# Patient Record
Sex: Female | Born: 1935 | Race: White | Hispanic: No | Smoking: Never smoker
Health system: Southern US, Community
[De-identification: ages and names within clinical notes are randomized; demographics above are authoritative.]

## PROBLEM LIST (undated history)

## (undated) DIAGNOSIS — C569 Malignant neoplasm of unspecified ovary: Secondary | ICD-10-CM

## (undated) DIAGNOSIS — E785 Hyperlipidemia, unspecified: Secondary | ICD-10-CM

## (undated) DIAGNOSIS — C801 Malignant (primary) neoplasm, unspecified: Secondary | ICD-10-CM

## (undated) DIAGNOSIS — I10 Essential (primary) hypertension: Secondary | ICD-10-CM

## (undated) DIAGNOSIS — C50919 Malignant neoplasm of unspecified site of unspecified female breast: Secondary | ICD-10-CM

## (undated) HISTORY — DX: Malignant neoplasm of unspecified ovary: C56.9

---

## 1998-06-05 ENCOUNTER — Other Ambulatory Visit: Admission: RE | Admit: 1998-06-05 | Discharge: 1998-06-05 | Payer: Self-pay | Admitting: Obstetrics and Gynecology

## 1999-08-19 ENCOUNTER — Encounter: Admission: RE | Admit: 1999-08-19 | Discharge: 1999-08-19 | Payer: Self-pay | Admitting: Obstetrics and Gynecology

## 1999-08-19 ENCOUNTER — Encounter: Payer: Self-pay | Admitting: Obstetrics and Gynecology

## 2000-02-09 DIAGNOSIS — C50919 Malignant neoplasm of unspecified site of unspecified female breast: Secondary | ICD-10-CM

## 2000-02-09 HISTORY — DX: Malignant neoplasm of unspecified site of unspecified female breast: C50.919

## 2000-02-09 HISTORY — PX: BREAST LUMPECTOMY: SHX2

## 2001-01-26 ENCOUNTER — Encounter: Admission: RE | Admit: 2001-01-26 | Discharge: 2001-01-26 | Payer: Self-pay | Admitting: Obstetrics and Gynecology

## 2001-01-26 ENCOUNTER — Encounter: Payer: Self-pay | Admitting: Obstetrics and Gynecology

## 2001-02-06 ENCOUNTER — Encounter: Admission: RE | Admit: 2001-02-06 | Discharge: 2001-02-06 | Payer: Self-pay | Admitting: Obstetrics and Gynecology

## 2001-02-06 ENCOUNTER — Encounter: Payer: Self-pay | Admitting: Obstetrics and Gynecology

## 2001-02-15 ENCOUNTER — Other Ambulatory Visit: Admission: RE | Admit: 2001-02-15 | Discharge: 2001-02-15 | Payer: Self-pay | Admitting: General Surgery

## 2001-02-27 ENCOUNTER — Encounter: Payer: Self-pay | Admitting: General Surgery

## 2001-02-27 ENCOUNTER — Encounter: Admission: RE | Admit: 2001-02-27 | Discharge: 2001-02-27 | Payer: Self-pay | Admitting: General Surgery

## 2001-02-28 ENCOUNTER — Ambulatory Visit (HOSPITAL_BASED_OUTPATIENT_CLINIC_OR_DEPARTMENT_OTHER): Admission: RE | Admit: 2001-02-28 | Discharge: 2001-02-28 | Payer: Self-pay | Admitting: General Surgery

## 2001-03-13 ENCOUNTER — Ambulatory Visit (HOSPITAL_BASED_OUTPATIENT_CLINIC_OR_DEPARTMENT_OTHER): Admission: RE | Admit: 2001-03-13 | Discharge: 2001-03-13 | Payer: Self-pay | Admitting: General Surgery

## 2003-01-24 ENCOUNTER — Encounter: Admission: RE | Admit: 2003-01-24 | Discharge: 2003-01-24 | Payer: Self-pay | Admitting: General Surgery

## 2003-02-09 HISTORY — PX: BREAST LUMPECTOMY: SHX2

## 2003-06-12 ENCOUNTER — Encounter: Admission: RE | Admit: 2003-06-12 | Discharge: 2003-06-12 | Payer: Self-pay | Admitting: General Surgery

## 2004-01-27 ENCOUNTER — Encounter: Admission: RE | Admit: 2004-01-27 | Discharge: 2004-01-27 | Payer: Self-pay | Admitting: Internal Medicine

## 2004-02-25 ENCOUNTER — Encounter: Admission: RE | Admit: 2004-02-25 | Discharge: 2004-02-25 | Payer: Self-pay | Admitting: General Surgery

## 2004-02-28 ENCOUNTER — Encounter: Admission: RE | Admit: 2004-02-28 | Discharge: 2004-02-28 | Payer: Self-pay | Admitting: General Surgery

## 2004-03-09 ENCOUNTER — Encounter: Admission: RE | Admit: 2004-03-09 | Discharge: 2004-03-09 | Payer: Self-pay | Admitting: Obstetrics and Gynecology

## 2004-03-12 ENCOUNTER — Encounter: Admission: RE | Admit: 2004-03-12 | Discharge: 2004-03-12 | Payer: Self-pay | Admitting: General Surgery

## 2004-03-13 ENCOUNTER — Encounter: Admission: RE | Admit: 2004-03-13 | Discharge: 2004-03-13 | Payer: Self-pay | Admitting: General Surgery

## 2004-03-13 ENCOUNTER — Ambulatory Visit (HOSPITAL_BASED_OUTPATIENT_CLINIC_OR_DEPARTMENT_OTHER): Admission: RE | Admit: 2004-03-13 | Discharge: 2004-03-13 | Payer: Self-pay | Admitting: General Surgery

## 2004-03-13 ENCOUNTER — Ambulatory Visit (HOSPITAL_COMMUNITY): Admission: RE | Admit: 2004-03-13 | Discharge: 2004-03-13 | Payer: Self-pay | Admitting: General Surgery

## 2004-03-24 ENCOUNTER — Ambulatory Visit (HOSPITAL_COMMUNITY): Admission: RE | Admit: 2004-03-24 | Discharge: 2004-03-24 | Payer: Self-pay | Admitting: General Surgery

## 2004-04-02 ENCOUNTER — Ambulatory Visit: Payer: Self-pay | Admitting: Oncology

## 2004-04-09 ENCOUNTER — Ambulatory Visit: Payer: Self-pay | Admitting: Oncology

## 2004-05-09 ENCOUNTER — Ambulatory Visit: Payer: Self-pay | Admitting: Oncology

## 2004-06-08 ENCOUNTER — Ambulatory Visit: Payer: Self-pay | Admitting: Oncology

## 2004-10-01 ENCOUNTER — Ambulatory Visit: Payer: Self-pay | Admitting: Oncology

## 2004-11-05 ENCOUNTER — Ambulatory Visit: Payer: Self-pay | Admitting: Radiation Oncology

## 2005-01-27 ENCOUNTER — Encounter: Admission: RE | Admit: 2005-01-27 | Discharge: 2005-01-27 | Payer: Self-pay | Admitting: General Surgery

## 2005-03-30 ENCOUNTER — Ambulatory Visit: Payer: Self-pay | Admitting: Oncology

## 2005-04-08 ENCOUNTER — Ambulatory Visit: Payer: Self-pay | Admitting: Oncology

## 2005-09-28 ENCOUNTER — Ambulatory Visit: Payer: Self-pay | Admitting: Oncology

## 2005-10-09 ENCOUNTER — Ambulatory Visit: Payer: Self-pay | Admitting: Oncology

## 2006-01-28 ENCOUNTER — Encounter: Admission: RE | Admit: 2006-01-28 | Discharge: 2006-01-28 | Payer: Self-pay | Admitting: Obstetrics and Gynecology

## 2006-03-29 ENCOUNTER — Ambulatory Visit: Payer: Self-pay | Admitting: Oncology

## 2006-04-08 ENCOUNTER — Encounter: Admission: RE | Admit: 2006-04-08 | Discharge: 2006-04-08 | Payer: Self-pay | Admitting: General Surgery

## 2006-04-09 ENCOUNTER — Ambulatory Visit: Payer: Self-pay | Admitting: Oncology

## 2006-04-09 ENCOUNTER — Ambulatory Visit: Payer: Self-pay | Admitting: Radiation Oncology

## 2006-05-10 ENCOUNTER — Ambulatory Visit: Payer: Self-pay | Admitting: Radiation Oncology

## 2006-09-09 ENCOUNTER — Ambulatory Visit: Payer: Self-pay | Admitting: Oncology

## 2006-09-26 ENCOUNTER — Ambulatory Visit: Payer: Self-pay | Admitting: Oncology

## 2006-10-10 ENCOUNTER — Ambulatory Visit: Payer: Self-pay | Admitting: Oncology

## 2006-12-12 ENCOUNTER — Ambulatory Visit: Payer: Self-pay | Admitting: Internal Medicine

## 2006-12-13 ENCOUNTER — Other Ambulatory Visit: Payer: Self-pay

## 2006-12-13 ENCOUNTER — Inpatient Hospital Stay: Payer: Self-pay | Admitting: Internal Medicine

## 2007-02-20 ENCOUNTER — Inpatient Hospital Stay: Payer: Self-pay | Admitting: Internal Medicine

## 2007-03-12 ENCOUNTER — Ambulatory Visit: Payer: Self-pay | Admitting: Oncology

## 2007-03-24 ENCOUNTER — Ambulatory Visit: Payer: Self-pay | Admitting: Internal Medicine

## 2007-03-29 ENCOUNTER — Ambulatory Visit: Payer: Self-pay | Admitting: Oncology

## 2007-04-09 ENCOUNTER — Ambulatory Visit: Payer: Self-pay | Admitting: Oncology

## 2007-05-10 ENCOUNTER — Ambulatory Visit: Payer: Self-pay | Admitting: Oncology

## 2007-09-09 ENCOUNTER — Ambulatory Visit: Payer: Self-pay | Admitting: Oncology

## 2007-09-27 ENCOUNTER — Ambulatory Visit: Payer: Self-pay | Admitting: Oncology

## 2007-10-10 ENCOUNTER — Ambulatory Visit: Payer: Self-pay | Admitting: Oncology

## 2008-02-09 HISTORY — PX: BREAST BIOPSY: SHX20

## 2008-04-08 ENCOUNTER — Ambulatory Visit: Payer: Self-pay | Admitting: Oncology

## 2008-04-17 ENCOUNTER — Ambulatory Visit: Payer: Self-pay | Admitting: Oncology

## 2008-05-09 ENCOUNTER — Ambulatory Visit: Payer: Self-pay | Admitting: Oncology

## 2008-08-14 ENCOUNTER — Ambulatory Visit: Payer: Self-pay | Admitting: Internal Medicine

## 2008-09-06 ENCOUNTER — Ambulatory Visit: Payer: Self-pay | Admitting: Surgery

## 2008-09-12 ENCOUNTER — Ambulatory Visit: Payer: Self-pay | Admitting: Internal Medicine

## 2008-10-03 ENCOUNTER — Ambulatory Visit: Payer: Self-pay | Admitting: Surgery

## 2009-06-03 ENCOUNTER — Ambulatory Visit: Payer: Self-pay | Admitting: Internal Medicine

## 2010-02-28 ENCOUNTER — Encounter: Payer: Self-pay | Admitting: Obstetrics and Gynecology

## 2010-03-01 ENCOUNTER — Encounter: Payer: Self-pay | Admitting: General Surgery

## 2010-06-24 ENCOUNTER — Ambulatory Visit: Payer: Self-pay | Admitting: Internal Medicine

## 2011-07-28 ENCOUNTER — Ambulatory Visit: Payer: Self-pay | Admitting: Internal Medicine

## 2012-08-15 ENCOUNTER — Ambulatory Visit: Payer: Self-pay | Admitting: Internal Medicine

## 2012-08-27 ENCOUNTER — Inpatient Hospital Stay: Payer: Self-pay | Admitting: Internal Medicine

## 2012-08-27 LAB — URINALYSIS, COMPLETE
Ketone: NEGATIVE
Specific Gravity: 1.009 (ref 1.003–1.030)
Squamous Epithelial: 1
WBC UR: 276 /HPF (ref 0–5)

## 2012-08-27 LAB — COMPREHENSIVE METABOLIC PANEL
Alkaline Phosphatase: 86 U/L (ref 50–136)
Calcium, Total: 9 mg/dL (ref 8.5–10.1)
Creatinine: 2.12 mg/dL — ABNORMAL HIGH (ref 0.60–1.30)
EGFR (Non-African Amer.): 22 — ABNORMAL LOW
Potassium: 3.9 mmol/L (ref 3.5–5.1)
SGPT (ALT): 40 U/L (ref 12–78)
Sodium: 128 mmol/L — ABNORMAL LOW (ref 136–145)

## 2012-08-27 LAB — CBC WITH DIFFERENTIAL/PLATELET
Eosinophil #: 0 10*3/uL (ref 0.0–0.7)
Lymphocyte %: 5 %
Monocyte %: 6.3 %
RDW: 13.1 % (ref 11.5–14.5)

## 2012-08-28 LAB — BASIC METABOLIC PANEL
BUN: 37 mg/dL — ABNORMAL HIGH (ref 7–18)
Calcium, Total: 8.1 mg/dL — ABNORMAL LOW (ref 8.5–10.1)
Chloride: 104 mmol/L (ref 98–107)
Glucose: 112 mg/dL — ABNORMAL HIGH (ref 65–99)
Potassium: 3.5 mmol/L (ref 3.5–5.1)
Sodium: 136 mmol/L (ref 136–145)

## 2012-08-28 LAB — CBC WITH DIFFERENTIAL/PLATELET
Basophil #: 0 10*3/uL (ref 0.0–0.1)
HGB: 10.5 g/dL — ABNORMAL LOW (ref 12.0–16.0)
Lymphocyte #: 0.4 10*3/uL — ABNORMAL LOW (ref 1.0–3.6)
MCH: 30.4 pg (ref 26.0–34.0)
MCHC: 33.6 g/dL (ref 32.0–36.0)
Monocyte %: 7.7 %
Platelet: 115 10*3/uL — ABNORMAL LOW (ref 150–440)

## 2012-08-29 LAB — CBC WITH DIFFERENTIAL/PLATELET
Eosinophil %: 0.3 %
HCT: 30.9 % — ABNORMAL LOW (ref 35.0–47.0)
Lymphocyte %: 11.1 %
MCH: 30.3 pg (ref 26.0–34.0)
MCHC: 33.3 g/dL (ref 32.0–36.0)
Monocyte %: 10.3 %
Neutrophil #: 4.3 10*3/uL (ref 1.4–6.5)
Platelet: 132 10*3/uL — ABNORMAL LOW (ref 150–440)
RBC: 3.4 10*6/uL — ABNORMAL LOW (ref 3.80–5.20)
WBC: 5.5 10*3/uL (ref 3.6–11.0)

## 2012-08-29 LAB — BASIC METABOLIC PANEL
BUN: 22 mg/dL — ABNORMAL HIGH (ref 7–18)
Calcium, Total: 8.4 mg/dL — ABNORMAL LOW (ref 8.5–10.1)
Chloride: 109 mmol/L — ABNORMAL HIGH (ref 98–107)
EGFR (African American): 43 — ABNORMAL LOW

## 2012-08-29 LAB — IRON AND TIBC
Iron Bind.Cap.(Total): 142 ug/dL — ABNORMAL LOW (ref 250–450)
Iron: 15 ug/dL — ABNORMAL LOW (ref 50–170)
Unbound Iron-Bind.Cap.: 127 ug/dL

## 2012-08-29 LAB — URINE CULTURE

## 2012-08-30 LAB — CBC WITH DIFFERENTIAL/PLATELET
Eosinophil #: 0.1 10*3/uL (ref 0.0–0.7)
Eosinophil %: 1.9 %
HCT: 30.1 % — ABNORMAL LOW (ref 35.0–47.0)
MCHC: 34.2 g/dL (ref 32.0–36.0)
MCV: 91 fL (ref 80–100)
Monocyte #: 0.7 x10 3/mm (ref 0.2–0.9)
Monocyte %: 12.1 %
Neutrophil #: 4.3 10*3/uL (ref 1.4–6.5)
Platelet: 154 10*3/uL (ref 150–440)

## 2013-03-27 ENCOUNTER — Emergency Department: Payer: Self-pay | Admitting: Emergency Medicine

## 2013-03-27 LAB — URINALYSIS, COMPLETE
Bacteria: NONE SEEN
Bilirubin,UR: NEGATIVE
Glucose,UR: NEGATIVE mg/dL (ref 0–75)
Ketone: NEGATIVE
Leukocyte Esterase: NEGATIVE
Nitrite: NEGATIVE
Ph: 7 (ref 4.5–8.0)
Protein: NEGATIVE
RBC,UR: 1 /HPF (ref 0–5)
Specific Gravity: 1.008 (ref 1.003–1.030)
Squamous Epithelial: <1
WBC UR: 1 /HPF (ref 0–5)

## 2013-06-21 ENCOUNTER — Ambulatory Visit: Payer: Self-pay | Admitting: Urology

## 2013-08-23 ENCOUNTER — Ambulatory Visit: Payer: Self-pay | Admitting: Internal Medicine

## 2013-10-02 DIAGNOSIS — N183 Chronic kidney disease, stage 3 unspecified: Secondary | ICD-10-CM | POA: Insufficient documentation

## 2013-11-15 ENCOUNTER — Emergency Department: Payer: Self-pay | Admitting: Emergency Medicine

## 2013-11-15 LAB — CBC WITH DIFFERENTIAL/PLATELET
Basophil #: 0 10*3/uL (ref 0.0–0.1)
Basophil %: 0.7 %
EOS ABS: 0.1 10*3/uL (ref 0.0–0.7)
Eosinophil %: 1.8 %
HCT: 45 % (ref 35.0–47.0)
HGB: 14.2 g/dL (ref 12.0–16.0)
LYMPHS ABS: 2.1 10*3/uL (ref 1.0–3.6)
Lymphocyte %: 32.1 %
MCH: 30 pg (ref 26.0–34.0)
MCHC: 31.6 g/dL — ABNORMAL LOW (ref 32.0–36.0)
MCV: 95 fL (ref 80–100)
MONO ABS: 0.6 x10 3/mm (ref 0.2–0.9)
Monocyte %: 8.7 %
Neutrophil #: 3.8 10*3/uL (ref 1.4–6.5)
Neutrophil %: 56.7 %
Platelet: 207 10*3/uL (ref 150–440)
RBC: 4.74 10*6/uL (ref 3.80–5.20)
RDW: 13.1 % (ref 11.5–14.5)
WBC: 6.6 10*3/uL (ref 3.6–11.0)

## 2013-11-15 LAB — COMPREHENSIVE METABOLIC PANEL
ALBUMIN: 3.9 g/dL (ref 3.4–5.0)
ALK PHOS: 97 U/L
ANION GAP: 6 — AB (ref 7–16)
AST: 31 U/L (ref 15–37)
BUN: 17 mg/dL (ref 7–18)
Bilirubin,Total: 1.2 mg/dL — ABNORMAL HIGH (ref 0.2–1.0)
CALCIUM: 9.2 mg/dL (ref 8.5–10.1)
CO2: 27 mmol/L (ref 21–32)
Chloride: 105 mmol/L (ref 98–107)
Creatinine: 1.27 mg/dL (ref 0.60–1.30)
GFR CALC AF AMER: 52 — AB
GFR CALC NON AF AMER: 43 — AB
Glucose: 90 mg/dL (ref 65–99)
Osmolality: 277 (ref 275–301)
Potassium: 5.1 mmol/L (ref 3.5–5.1)
SGPT (ALT): 29 U/L
Sodium: 138 mmol/L (ref 136–145)
TOTAL PROTEIN: 8.2 g/dL (ref 6.4–8.2)

## 2013-11-15 LAB — URINALYSIS, COMPLETE
BILIRUBIN, UR: NEGATIVE
Glucose,UR: NEGATIVE mg/dL (ref 0–75)
Ketone: NEGATIVE
NITRITE: POSITIVE
Ph: 7 (ref 4.5–8.0)
Protein: NEGATIVE
Specific Gravity: 1.005 (ref 1.003–1.030)
WBC UR: 1428 /HPF (ref 0–5)

## 2013-11-15 LAB — TROPONIN I: Troponin-I: <0.02 ng/mL

## 2013-11-17 LAB — URINE CULTURE

## 2013-11-20 LAB — CULTURE, BLOOD (SINGLE)

## 2013-11-23 DIAGNOSIS — N390 Urinary tract infection, site not specified: Secondary | ICD-10-CM | POA: Insufficient documentation

## 2014-04-02 DIAGNOSIS — E034 Atrophy of thyroid (acquired): Secondary | ICD-10-CM | POA: Insufficient documentation

## 2014-05-31 NOTE — H&P (Signed)
PATIENT NAME:  Ebony Navarro, Ebony Navarro MR#:  [id number] DATE OF BIRTH:  [birth year]  DATE OF ADMISSION:  08/27/2012  REFERRING PHYSICIAN:  Dr.  Jacqualine Code     FAMILY PHYSICIAN:  Dr. Ramonita Lab  REASON FOR ADMISSION:  Acute renal failure.   HISTORY OF PRESENT ILLNESS:  The patient is a 81 year old female with a history of breast cancer, hypertension and hyperlipidemia, who presents with a 3 to 4 day history of nausea, vomiting, diarrhea and fever. Developed worsening weakness and fatigue. In the Emergency Room, the patient was noted to be in acute renal failure with dehydration. She was also found to have a UTI. She is now admitted for further evaluation.   PAST MEDICAL HISTORY: 1.  Breast cancer, status post lumpectomy.  2.  Benign hypertension.  3.  Hyperlipidemia. 4.  GE reflux disease.  5.  History of esophagitis.  6.  History of depression.   MEDICATIONS: 1.  Zocor 40 mg p.o. at bedtime.  2.  Prilosec 20 mg p.o. daily.  3.  Cardizem CD 120 mg p.o. daily.  4.  Xanax 0.25 mg p.o. every morning.   ALLERGIES: IODINE, CIPRO, MACROBID, SULFA, ACE INHIBITORS and BETA BLOCKERS.   SOCIAL HISTORY:  Negative for alcohol or tobacco abuse.   FAMILY HISTORY:  Negative for colon or breast cancer. Positive for coronary artery disease and hypertension.   REVIEW OF SYSTEMS:    CONSTITUTIONAL:  Positive fevers. No change in weight.  EYES:  No blurred or double vision. No glaucoma.  EARS, NOSE, THROAT: No tinnitus or hearing loss. No nasal discharge or bleeding. No difficulty swallowing.  RESPIRATORY:  Some cough, but no wheezing. Denies hemoptysis. No painful respiration.  CARDIOVASCULAR:  No chest pain or orthopnea. No palpitations or syncope.  GASTROINTESTINAL:  The patient has had nausea, vomiting, diarrhea and abdominal pain.  GENITOURINARY:  No dysuria or hematuria. No incontinence.  ENDOCRINE:  No polyuria or polydipsia. No heat or cold intolerance.  HEMATOLOGIC:  The patient denies anemia, easy  bruising or bleeding.  LYMPHATIC:   No swollen glands. MUSCULOSKELETAL:  The patient denies neck, back, shoulder, knee or hip pain. No gout.  NEUROLOGIC:  No numbness or migraines. Denies stroke or seizures. Does have generalized weakness.  PSYCHIATRIC:  The patient denies anxiety, insomnia or depression.   PHYSICAL EXAMINATION: GENERAL:  The patient is in no acute distress.  VITAL SIGNS:  Currently remarkable for blood pressure 127/71, with a heart rate of 105 and a respiratory rate of 20, temperature is 100.8.  HEENT: Normocephalic, atraumatic. Pupils equally round and reactive to light and accommodation. Extraocular movements are intact. Sclerae are nonicteric. Conjunctivae are clear. Oropharynx is dry, but clear.  NECK:  Supple, without JVD. No adenopathy or thyromegaly is noted.  LUNGS:  Clear to auscultation and percussion without wheezes, rales or rhonchi. No dullness.  CARDIAC EXAM:  A rapid rate with a regular rhythm. Normal S1 and S2. No significant rubs, murmurs or gallops. PMI is nondisplaced. Chest wall is nontender.  ABDOMEN:  Soft, nontender, with normoactive bowel sounds. No organomegaly or masses were appreciated. No hernias or bruits were noted. No CVA tenderness.  EXTREMITIES:  Without clubbing, cyanosis or edema. Pulses were 2+ bilaterally.  SKIN:  Warm and dry, without rash or lesions.  NEUROLOGIC EXAM: Revealed cranial nerves II through XII grossly intact. Deep tendon reflexes were symmetric. Motor and sensory exams nonfocal.  PSYCHIATRIC EXAM:  Revealed a patient who was alert and oriented to person, place and time. She  was cooperative and used good judgment.   LABORATORY DATA: Chest x-ray was unremarkable. White count was 12.5, with a hemoglobin of 13.3. Glucose 107, with a BUN of 38, creatinine of 2.12, with a GFR of 22. Sodium was 128, with a potassium of 3.9. Urinalysis revealed 2+ bacteria, with 3+ leukocyte esterase.   ASSESSMENT: 1.  Acute renal failure.  2.   Dehydration.  3.  Hyponatremia. 4.  Urinary tract infection.  5.  Tachycardia.  6.  Nausea with vomiting.   PLAN:  The patient will be admitted to the floor and started on IV antibiotics after the urine culture has been sent. Will begin IV fluids in the form of normal saline. Will use Zofran as needed for nausea and vomiting. Will continue her outpatient medications. Clear liquid diet for now. Follow up labs in the morning. Further treatment and evaluation will depend upon the patient's progress.   Total time spent on this patient was 50 minutes.    ____________________________ Leonie Douglas Doy Hutching, MD jds:mr D: 08/27/2012 17:41:00 ET T: 08/27/2012 19:41:15 ET JOB#: [id number]  cc: Leonie Douglas. Doy Hutching, MD, <Dictator> Adin Hector, MD  Persis Graffius Lennice Sites MD ELECTRONICALLY SIGNED 08/27/2012 20:11

## 2014-05-31 NOTE — Discharge Summary (Signed)
PATIENT NAME:  Ebony Navarro, Ebony Navarro MR#:  [id number] DATE OF BIRTH:  15-Aug-1933  DATE OF ADMISSION:  08/27/2012 DATE OF DISCHARGE:    FINAL DIAGNOSES:  1.  Urinary tract infection.  2.  Sepsis secondary to #1.  3.  Acute renal failure secondary to #1.  4.  History of breast cancer.  5.  Hypertension.  6.  Hyperlipidemia.  7.  Gastroesophageal reflux disease.   HISTORY AND PHYSICAL: Please see dictated admission history and physical.   [place]: The patient was admitted with nausea, vomiting, dehydration, evidence of acute renal failure, as well as evidence of sepsis. She was found to have evidence of urinary tract infection. Culture subsequently grew out E. coli, which was sensitive to all antibiotics. She was changed over to oral antibiotics and tolerated this well. Her diet was advanced and she was hydrated. Her renal function returned to normal. Her white count normalized as well. She was ambulating and felt back to in good health. She was therefore discharged to home in stable condition with physical activity to be up as tolerated. She will follow a 2 gram sodium diet and she will follow up in our office in the next 2 weeks, keeping the appointment that she already has arranged.   DISCHARGE MEDICATIONS:  1.  Simvastatin 40 mg p.o. at bedtime.  2.  Xanax 0.25 mg p.o. q.a.m.  3.  Omeprazole 20 mg p.o. daily.  4.  Cardizem CD 120 mg p.o. daily.  5.  Keflex 500 mg p.o. q.8 hours x 7 days to complete course of antibiotics.  ____________________________ Adin Hector, MD bjk:aw D: 08/30/2012 07:57:37 ET T: 08/30/2012 09:22:34 ET JOB#: [id number]  cc: Adin Hector, MD, <Dictator> Ramonita Lab MD ELECTRONICALLY SIGNED 08/31/2012 22:16

## 2014-07-29 ENCOUNTER — Other Ambulatory Visit: Payer: Self-pay | Admitting: Family Medicine

## 2014-07-29 DIAGNOSIS — R3129 Other microscopic hematuria: Secondary | ICD-10-CM

## 2014-09-02 ENCOUNTER — Other Ambulatory Visit: Payer: Self-pay | Admitting: Internal Medicine

## 2014-09-02 DIAGNOSIS — Z1231 Encounter for screening mammogram for malignant neoplasm of breast: Secondary | ICD-10-CM

## 2014-09-04 ENCOUNTER — Ambulatory Visit: Payer: Self-pay

## 2014-09-09 ENCOUNTER — Other Ambulatory Visit: Payer: Self-pay | Admitting: Internal Medicine

## 2014-09-09 ENCOUNTER — Ambulatory Visit
Admission: RE | Admit: 2014-09-09 | Discharge: 2014-09-09 | Disposition: A | Discharge status: Home / Self Care | Payer: Medicare Other | Source: Ambulatory Visit | Attending: Internal Medicine | Admitting: Internal Medicine

## 2014-09-09 DIAGNOSIS — Z1231 Encounter for screening mammogram for malignant neoplasm of breast: Secondary | ICD-10-CM

## 2014-09-09 HISTORY — DX: Malignant (primary) neoplasm, unspecified: C80.1

## 2014-09-09 HISTORY — DX: Malignant neoplasm of unspecified site of unspecified female breast: C50.919

## 2014-09-11 ENCOUNTER — Other Ambulatory Visit (INDEPENDENT_AMBULATORY_CARE_PROVIDER_SITE_OTHER): Payer: Medicare Other | Admitting: Obstetrics and Gynecology

## 2014-09-11 DIAGNOSIS — N390 Urinary tract infection, site not specified: Secondary | ICD-10-CM

## 2014-09-11 LAB — POCT URINALYSIS DIPSTICK
Bilirubin, UA: NEGATIVE
Glucose, UA: NEGATIVE
Ketones, UA: NEGATIVE
NITRITE UA: POSITIVE
PH UA: 5
Spec Grav, UA: 1.01
UROBILINOGEN UA: NEGATIVE

## 2014-09-11 MED ORDER — CEPHALEXIN 250 MG PO CAPS: 250.0000 mg | ORAL_CAPSULE | Freq: Every day | ORAL | Status: DC

## 2014-09-11 MED ORDER — AMOXICILLIN-POT CLAVULANATE 875-125 MG PO TABS: 1.0000 | ORAL_TABLET | Freq: Two times a day (BID) | ORAL | Status: DC

## 2014-09-11 NOTE — Addendum Note (Signed)
Addended by: Earl Lagos on: 09/11/2014 05:49 PM   Modules accepted: Orders

## 2014-09-11 NOTE — Progress Notes (Signed)
Pt walks in c/o UTI to receptionist and Dr. Lina Sayre for pt to drop off urine sample. Urinalysis positive. Augmentin 1 tab po bid x7d to be sent to pharmacy. Pt. Notified. Also filled Cephalexin 250mg  1 tab po daily #2 refills and pt instructed not to take until she finishes her Augmentin.

## 2014-09-14 LAB — URINE CULTURE

## 2014-12-27 ENCOUNTER — Other Ambulatory Visit: Payer: Self-pay

## 2014-12-27 DIAGNOSIS — N390 Urinary tract infection, site not specified: Secondary | ICD-10-CM

## 2014-12-27 MED ORDER — CEPHALEXIN 250 MG PO CAPS: 250.0000 mg | ORAL_CAPSULE | Freq: Every day | ORAL | Status: DC

## 2015-01-22 ENCOUNTER — Ambulatory Visit (INDEPENDENT_AMBULATORY_CARE_PROVIDER_SITE_OTHER): Payer: Medicare Other | Admitting: Obstetrics and Gynecology

## 2015-01-22 ENCOUNTER — Encounter: Payer: Self-pay | Admitting: Obstetrics and Gynecology

## 2015-01-22 VITALS — BP 143/89 | HR 103 | Ht 67.0 in | Wt 177.9 lb

## 2015-01-22 DIAGNOSIS — R739 Hyperglycemia, unspecified: Secondary | ICD-10-CM | POA: Insufficient documentation

## 2015-01-22 DIAGNOSIS — IMO0002 Reserved for concepts with insufficient information to code with codable children: Secondary | ICD-10-CM

## 2015-01-22 DIAGNOSIS — F329 Major depressive disorder, single episode, unspecified: Secondary | ICD-10-CM | POA: Insufficient documentation

## 2015-01-22 DIAGNOSIS — D649 Anemia, unspecified: Secondary | ICD-10-CM | POA: Insufficient documentation

## 2015-01-22 DIAGNOSIS — N390 Urinary tract infection, site not specified: Secondary | ICD-10-CM | POA: Diagnosis not present

## 2015-01-22 DIAGNOSIS — E785 Hyperlipidemia, unspecified: Secondary | ICD-10-CM | POA: Insufficient documentation

## 2015-01-22 DIAGNOSIS — N952 Postmenopausal atrophic vaginitis: Secondary | ICD-10-CM | POA: Diagnosis not present

## 2015-01-22 DIAGNOSIS — N8110 Cystocele, unspecified: Secondary | ICD-10-CM | POA: Diagnosis not present

## 2015-01-22 DIAGNOSIS — IMO0001 Reserved for inherently not codable concepts without codable children: Secondary | ICD-10-CM

## 2015-01-22 DIAGNOSIS — I10 Essential (primary) hypertension: Secondary | ICD-10-CM | POA: Insufficient documentation

## 2015-01-22 DIAGNOSIS — C50919 Malignant neoplasm of unspecified site of unspecified female breast: Secondary | ICD-10-CM | POA: Insufficient documentation

## 2015-01-22 DIAGNOSIS — F419 Anxiety disorder, unspecified: Secondary | ICD-10-CM | POA: Insufficient documentation

## 2015-01-22 DIAGNOSIS — K2970 Gastritis, unspecified, without bleeding: Secondary | ICD-10-CM | POA: Insufficient documentation

## 2015-01-22 DIAGNOSIS — F32A Depression, unspecified: Secondary | ICD-10-CM | POA: Insufficient documentation

## 2015-01-22 LAB — POCT URINALYSIS DIPSTICK
Bilirubin, UA: NEGATIVE
GLUCOSE UA: NEGATIVE
Ketones, UA: NEGATIVE
NITRITE UA: POSITIVE
PROTEIN UA: NEGATIVE
SPEC GRAV UA: 1.01
UROBILINOGEN UA: NEGATIVE
pH, UA: 6

## 2015-01-24 LAB — URINE CULTURE

## 2015-01-26 NOTE — Progress Notes (Signed)
GYNECOLOGY PROGRESS NOTE  Subjective:    Patient ID: Ebony Navarro, female    DOB: 1933-08-01, 81 y.o.   MRN: [id number]  HPI  Patient is a 81 y.o. P58 female who presents for follow up.  Patient with h/o cystocele, recurrent UTIs.  Has been on antibiotic suppression therapy for recurrent UTIs x 12 months (recently discontinued low dose Keflex ~ 2 weeks ago).  Desires urine testing today to ensure no new UTI. Denies symptoms currently.   The following portions of the patient's history were reviewed and updated as appropriate: allergies, current medications, past family history, past medical history, past social history, past surgical history and problem list.  Review of Systems A comprehensive review of systems was negative except for: Genitourinary: positive for slight bulge in vagina, not bothersome, occasionally noticed   Objective:   Blood pressure 143/89, pulse 103, height 5\' 7"  (1.702 m), weight 177 lb 14.4 oz (80.695 kg). General appearance: alert and no distress Abdomen: soft, non-tender; bowel sounds normal; no masses,  no organomegaly Pelvic: cervix normal in appearance, external genitalia normal, no adnexal masses or tenderness, no bladder tenderness, no cervical motion tenderness, positive findings: cystocele or vaginal mucosa atrophic, rectovaginal septum normal, uterus normal size, shape, and consistency and vagina normal without discharge Extremities: extremities normal, atraumatic, no cyanosis or edema Neurologic: Grossly normal    Labs:  Results for orders placed or performed in visit on 01/22/15  POCT urinalysis dipstick  Result Value Ref Range   Color, UA Pale Yellow    Clarity, UA coudy    Glucose, UA neg    Bilirubin, UA neg    Ketones, UA neg    Spec Grav, UA 1.010    Blood, UA Large    pH, UA 6.0    Protein, UA neg    Urobilinogen, UA negative    Nitrite, UA Positive    Leukocytes, UA large (3+) (A) Negative    Assessment:   Grade II  Cystocele H/o recurrent UTI's Vaginal atrophy  Plan:   1) Grade II Cystocele - Patient with h/o cystocele, has tried pessary in the past but did not take to it.  Notes that it is not currently symptomatic, just wanted reassurance that it had not worsened.  Continued to encourage Kegel exercises. 2) Recurrent UTI's - patient discontinued prophylactic long-term antibiotics after 1 year to see if still needed.  UTI noted on today's UA, will order and wait for culture due to patient's multiple allergies, and h/o drug resistance.  3) Vaginal atrophy - patient notes that she has not been using Premarin cream as much since she had been asymptomatic without a UTI for over 6-8 months.  Encouraged to begin using again just at urethral meatus 1-2 times weekly.   To follow up as needed, or if symptoms worsen.   Rubie Maid, MD [facility]

## 2015-01-27 ENCOUNTER — Telehealth: Payer: Self-pay

## 2015-01-27 MED ORDER — TRIMETHOPRIM 100 MG PO TABS: 100.0000 mg | ORAL_TABLET | Freq: Two times a day (BID) | ORAL | Status: DC

## 2015-01-27 NOTE — Telephone Encounter (Signed)
-----   Message from Rubie Maid, MD sent at 01/26/2015  5:07 PM EST ----- Please inform patient of UTI.  Has multi-drug resistance and due to patient's multiple allergies, there is very limited antibiotic coverage.  Will treat with Trimethoprim 100 mg BID x 10 days.  At some point in the near future patient is at risk for requiring IV antibiotics to treat UTIs.

## 2015-01-27 NOTE — Telephone Encounter (Signed)
Pt aware. Med rx.  

## 2015-02-12 ENCOUNTER — Other Ambulatory Visit: Payer: Medicare Other

## 2015-02-12 DIAGNOSIS — N390 Urinary tract infection, site not specified: Secondary | ICD-10-CM

## 2015-02-14 LAB — URINE CULTURE: ORGANISM ID, BACTERIA: NO GROWTH

## 2015-02-18 ENCOUNTER — Telehealth: Payer: Self-pay

## 2015-02-18 NOTE — Telephone Encounter (Signed)
-----   Message from Brayton Mars, MD sent at 02/17/2015  8:52 PM EST ----- Please Notify - Labs normal

## 2015-02-18 NOTE — Telephone Encounter (Signed)
Pt aware urine culture neg. Wants a atb to take daily. Will ask AC.

## 2015-02-20 NOTE — Telephone Encounter (Signed)
Please inform patient that we need to be cautious about continuous long use of antibiotics as she already has allergies to so many antibiotics, and may develop resistance over time to others if prescribed long term. If she has another UTI this month, will reconsider.

## 2015-02-21 NOTE — Telephone Encounter (Signed)
Pt aware,

## 2015-04-09 DIAGNOSIS — Z136 Encounter for screening for cardiovascular disorders: Secondary | ICD-10-CM

## 2015-04-09 DIAGNOSIS — I10 Essential (primary) hypertension: Secondary | ICD-10-CM

## 2015-04-09 DIAGNOSIS — Z139 Encounter for screening, unspecified: Secondary | ICD-10-CM

## 2015-04-09 NOTE — Congregational Nurse Program (Unsigned)
Congregational Nurse Program Note  Date of Encounter: 04/09/2015  Past Medical History: No past medical history on file.  Encounter Details:     CNP Questionnaire - 04/09/15 1214    Patient Demographics   Is this a new or existing patient? Existing   Patient is considered a/an Not Applicable   Race Caucasian/White   Patient Assistance   Location of Patient Assistance Not Applicable   Patient's financial/insurance status Medicare   Uninsured Patient No   Patient referred to apply for the following financial assistance Not Applicable   Food insecurities addressed Not Applicable   Transportation assistance No   Assistance securing medications No   Educational health offerings Cardiac disease   Encounter Details   Primary purpose of visit Chronic Illness/Condition Visit   Was an Emergency Department visit averted? Not Applicable   Does patient have a medical provider? Yes   Patient referred to Not Applicable   Was a mental health screening completed? (GAINS tool) No   Does patient have dental issues? No   Does patient have vision issues? No   Since previous encounter, have you referred patient for abnormal blood pressure that resulted in a new diagnosis or medication change? No   Since previous encounter, have you referred patient for abnormal blood glucose that resulted in a new diagnosis or medication change? No   For Abstraction Use Only   Does patient have insurance? Yes       Ebony Navarro, came to my office requesting her blood pressure to be check since it had not been for several weeks. BP 132/82 mmHg Ebony Navarro [place] RN, [place] [phone]  [name] [place], [place]

## 2015-08-25 ENCOUNTER — Other Ambulatory Visit: Payer: Self-pay | Admitting: Internal Medicine

## 2015-08-25 DIAGNOSIS — Z1231 Encounter for screening mammogram for malignant neoplasm of breast: Secondary | ICD-10-CM

## 2015-09-11 ENCOUNTER — Other Ambulatory Visit: Payer: Self-pay | Admitting: Internal Medicine

## 2015-09-11 ENCOUNTER — Ambulatory Visit
Admission: RE | Admit: 2015-09-11 | Discharge: 2015-09-11 | Disposition: A | Discharge status: Home / Self Care | Payer: Medicare Other | Source: Ambulatory Visit | Attending: Internal Medicine | Admitting: Internal Medicine

## 2015-09-11 DIAGNOSIS — Z1231 Encounter for screening mammogram for malignant neoplasm of breast: Secondary | ICD-10-CM | POA: Insufficient documentation

## 2016-02-29 ENCOUNTER — Emergency Department
Admission: EM | Admit: 2016-02-29 | Discharge: 2016-02-29 | Disposition: A | Discharge status: Home / Self Care | Payer: Medicare Other | Attending: Emergency Medicine | Admitting: Emergency Medicine

## 2016-02-29 DIAGNOSIS — N390 Urinary tract infection, site not specified: Secondary | ICD-10-CM | POA: Insufficient documentation

## 2016-02-29 DIAGNOSIS — Z853 Personal history of malignant neoplasm of breast: Secondary | ICD-10-CM | POA: Diagnosis not present

## 2016-02-29 DIAGNOSIS — Z79899 Other long term (current) drug therapy: Secondary | ICD-10-CM | POA: Insufficient documentation

## 2016-02-29 DIAGNOSIS — R319 Hematuria, unspecified: Secondary | ICD-10-CM | POA: Diagnosis present

## 2016-02-29 DIAGNOSIS — N183 Chronic kidney disease, stage 3 (moderate): Secondary | ICD-10-CM | POA: Insufficient documentation

## 2016-02-29 LAB — URINALYSIS, COMPLETE (UACMP) WITH MICROSCOPIC
Bilirubin Urine: NEGATIVE
Glucose, UA: NEGATIVE mg/dL
KETONES UR: NEGATIVE mg/dL
Nitrite: NEGATIVE
PH: 6 (ref 5.0–8.0)
Protein, ur: NEGATIVE mg/dL
SPECIFIC GRAVITY, URINE: 1.015 (ref 1.005–1.030)
SQUAMOUS EPITHELIAL / LPF: NONE SEEN

## 2016-02-29 MED ORDER — CEPHALEXIN 500 MG PO CAPS: 500.0000 mg | ORAL_CAPSULE | Freq: Two times a day (BID) | ORAL | 0 refills | Status: DC

## 2016-02-29 MED ORDER — CEFTRIAXONE SODIUM-DEXTROSE 1-3.74 GM-% IV SOLR: 1.0000 g | Freq: Once | INTRAVENOUS | Status: DC

## 2016-02-29 MED ORDER — DEXTROSE 5 % IV SOLN: 1.0000 g | Freq: Once | INTRAVENOUS | Status: DC

## 2016-02-29 MED ORDER — CEFTRIAXONE SODIUM 1 G IJ SOLR
1.0000 g | Freq: Once | INTRAMUSCULAR | Status: AC
  Administered 2016-02-29: 1 g via INTRAMUSCULAR
  Filled 2016-02-29: qty 10

## 2016-02-29 MED ORDER — LIDOCAINE HCL (PF) 1 % IJ SOLN
INTRAMUSCULAR | Status: AC
  Administered 2016-02-29: 2.1 mL
  Filled 2016-02-29: qty 5

## 2016-02-29 NOTE — ED Notes (Signed)
Family at bedside. 

## 2016-02-29 NOTE — Discharge Instructions (Signed)
You're being treated for urinary tract infection. You're given 24-hour dose of Rocephin shot today in the emergency room. Start Keflex antibiotic tomorrow evening. A urine culture was sent, and he will be called if it shows a resistant infection.  As we discussed, please increase your probiotics including yogurt, or probiotic capsules.  Return to the emergency department immediately for any new or worsening symptoms including pain, fever, worsening bleeding, dizziness, nausea, or any other symptoms concerning to you.

## 2016-02-29 NOTE — ED Provider Notes (Signed)
[facility] Emergency Department Provider Note ____________________________________________   I have reviewed the triage vital signs and the triage nursing note.  HISTORY  Chief Complaint Vaginal Bleeding   Historian Patient and daughter  HPI Ebony Navarro is a 83 y.o. female who lives alone, not on any blood thinners, presents to the emergency department after seeing blood on the toilet paper when she wiped today. Mild amount. No abdominal pain. No nausea. No vomiting. No diarrhea. She was seen a few days ago for abscess on the neck and is currently on clindamycin and mupirocin topical. That is healing up.  No fever. No respiratory symptoms.    Past Medical History:  Diagnosis Date  . Breast cancer ([place]) 2005   Right, radiation and lumpectomy  . Breast cancer ([place]) 2002   Left, Chemo, radiation and lumpectomy  . Cancer [facility])     Patient Active Problem List   Diagnosis Date Noted  . Absolute anemia 01/22/2015  . Anxiety 01/22/2015  . Malignant neoplasm of breast ([place]) 01/22/2015  . Clinical depression 01/22/2015  . Gastric catarrh 01/22/2015  . Blood glucose elevated 01/22/2015  . HLD (hyperlipidemia) 01/22/2015  . BP (high blood pressure) 01/22/2015  . Acquired atrophy of thyroid 04/02/2014  . Frequent UTI 11/23/2013  . Chronic kidney disease (CKD), stage III (moderate) 10/02/2013    Past Surgical History:  Procedure Laterality Date  . BREAST BIOPSY Left 2010   negative stereotactic biopsy  . BREAST LUMPECTOMY Right 2005   positive  . BREAST LUMPECTOMY Left 2002   positive    Prior to Admission medications   Medication Sig Start Date End Date Taking? Authorizing Provider  amoxicillin-clavulanate (AUGMENTIN) 875-125 MG per tablet Take 1 tablet by mouth 2 (two) times daily. 09/11/14   Rubie Maid, MD  atorvastatin (LIPITOR) 40 MG tablet Take by mouth.    Historical Provider, MD  cephALEXin (KEFLEX) 500 MG capsule Take 1 capsule  (500 mg total) by mouth 2 (two) times daily. 02/29/16   Lisa Roca, MD  diltiazem (CARDIZEM CD) 120 MG 24 hr capsule TAKE 1 CAPSULE BY MOUTH EVERY OTHER DAY 10/09/14   Historical Provider, MD  levothyroxine (SYNTHROID, LEVOTHROID) 50 MCG tablet TAKE 1 TABLET BY MOUTH DAILY ON AN EMPTY STOMACH WITH A GLASS OF WATER 30-60 MIN BEFORE BREAKFAST 11/11/14   Historical Provider, MD  PREVNAR 13 SUSP injection TO BE ADMINISTERED BY PHARMACIST FOR IMMUNIZATION 11/21/14   Historical Provider, MD  trimethoprim (TRIMPEX) 100 MG tablet Take 1 tablet (100 mg total) by mouth 2 (two) times daily. 01/27/15   Rubie Maid, MD    Allergies  Allergen Reactions  . Ace Inhibitors Other (See Comments)    hyperkalemia  . Atenolol Other (See Comments)    Worsening palpitations  . Iodine Other (See Comments)  . Metoprolol Tartrate Other (See Comments)    intolerant  . Povidone-Iodine Other (See Comments)  . Quinolones Other (See Comments)  . Rofecoxib Nausea Only  . Ciprofloxacin Rash  . Doxycycline Calcium Rash  . Nitrofurantoin Rash  . Sulfa Antibiotics Rash    Family History  Problem Relation Age of Onset  . Hypertension Mother   . Hypertension Father     Social History Social History  Substance Use Topics  . Smoking status: Never Smoker  . Smokeless tobacco: Not on file  . Alcohol use No    Review of Systems  Constitutional: Negative for fever. Eyes: Negative for visual changes. ENT: Negative for sore throat. Cardiovascular: Negative for  chest pain. Respiratory: Negative for shortness of breath. Gastrointestinal: Negative for abdominal pain, vomiting and diarrhea. Genitourinary: Negative for dysuria. Musculoskeletal: Negative for back pain. Skin: Abscess on neck is improving. Neurological: Negative for headache. 10 point Review of Systems otherwise negative ____________________________________________   PHYSICAL EXAM:  VITAL SIGNS: ED Triage Vitals  Enc Vitals Group     BP  02/29/16 1558 (!) 162/94     Pulse Rate 02/29/16 1558 (!) 102     Resp 02/29/16 1558 16     Temp 02/29/16 1558 98.3 F (36.8 C)     Temp Source 02/29/16 1558 Oral     SpO2 02/29/16 1558 97 %     Weight 02/29/16 1559 177 lb (80.3 kg)     Height --      Head Circumference --      Peak Flow --      Pain Score --      Pain Loc --      Pain Edu? --      Excl. in [place]? --      Constitutional: Alert and oriented. Well appearing and in no distress. HEENT   Head: Normocephalic and atraumatic.      Eyes: Conjunctivae are normal. PERRL. Normal extraocular movements.      Ears:         Nose: No congestion/rhinnorhea.   Mouth/Throat: Mucous membranes are moist.   Neck: No stridor.  Healing/scab abscess on the anterior neck. Cardiovascular/Chest: Normal rate, regular rhythm.  No murmurs, rubs, or gallops. Respiratory: Normal respiratory effort without tachypnea nor retractions. Breath sounds are clear and equal bilaterally. No wheezes/rales/rhonchi. Gastrointestinal: Soft. No distention, no guarding, no rebound. Nontender.    Genitourinary/rectal:  External visualization without any blood on the pad, no obvious vaginal bleeding. Musculoskeletal: Nontender with normal range of motion in all extremities. No joint effusions.  No lower extremity tenderness.  No edema. Neurologic:  Normal speech and language. No gross or focal neurologic deficits are appreciated. Skin:  Skin is warm, dry and intact. Healing scab on the anterior neck. Psychiatric: Mood and affect are normal. Speech and behavior are normal. Patient exhibits appropriate insight and judgment.   ____________________________________________  LABS (pertinent positives/negatives)  Labs Reviewed  URINALYSIS, COMPLETE (UACMP) WITH MICROSCOPIC - Abnormal; Notable for the following:       Result Value   Color, Urine YELLOW (*)    APPearance CLOUDY (*)    Hgb urine dipstick MODERATE (*)    Leukocytes, UA LARGE (*)    Bacteria,  UA MANY (*)    All other components within normal limits  URINE CULTURE    ____________________________________________    EKG I, Lisa Roca, MD, the attending physician have personally viewed and interpreted all ECGs.  None ____________________________________________  RADIOLOGY All Xrays were viewed by me. Imaging interpreted by Radiologist.  None __________________________________________  PROCEDURES  Procedure(s) performed: None  Critical Care performed: None  ____________________________________________   ED COURSE / ASSESSMENT AND PLAN  Pertinent labs & imaging results that were available during my care of the patient were reviewed by me and considered in my medical decision making (see chart for details).    Patient was not sure if she was having vaginal or urinary bleeding. She's had urinary tract infections before, but never with blood in it.  Urinalysis consistent with urinary tract infection with hematuria. I discussed this with the patient. External exam does not indicate vaginal/uterine bleeding.    In terms of the UTI, she does not appear  to be septic. No reported fevers. No systemic symptoms. No abdominal pain. I don't think that she needs additional laboratory studies at this point time. I think she is okay for treatment and outpatient management. I discussed with her one dose of Rocephin and she felt like this was a good plan.  Urine culture was sent. We discussed return precautions especially with regard to bleeding.    CONSULTATIONS:   None   Patient / Family / Caregiver informed of clinical course, medical decision-making process, and agree with plan.   I discussed return precautions, follow-up instructions, and discharge instructions with patient and/or family.   ___________________________________________   FINAL CLINICAL IMPRESSION(S) / ED DIAGNOSES   Final diagnoses:  Urinary tract infection with hematuria, site unspecified               Note: This dictation was prepared with Dragon dictation. Any transcriptional errors that result from this process are unintentional    Lisa Roca, MD 02/29/16 [phone]

## 2016-02-29 NOTE — ED Triage Notes (Addendum)
Pt started on antibiotics yesterday for abscess to chin. Pt c/o vaginal bleeding that started this AM. Described as light to moderate. Pt alert and oriented X4, active, cooperative, pt in NAD. RR even and unlabored, color WNL.  Pt reports frequency with urination. Denies pain.

## 2016-03-03 LAB — URINE CULTURE

## 2016-09-27 ENCOUNTER — Other Ambulatory Visit: Payer: Self-pay | Admitting: Internal Medicine

## 2016-09-27 DIAGNOSIS — Z1231 Encounter for screening mammogram for malignant neoplasm of breast: Secondary | ICD-10-CM

## 2016-10-14 ENCOUNTER — Ambulatory Visit
Admission: RE | Admit: 2016-10-14 | Discharge: 2016-10-14 | Disposition: A | Discharge status: Home / Self Care | Payer: Medicare Other | Source: Ambulatory Visit | Attending: Internal Medicine | Admitting: Internal Medicine

## 2016-10-14 DIAGNOSIS — Z1231 Encounter for screening mammogram for malignant neoplasm of breast: Secondary | ICD-10-CM | POA: Diagnosis present

## 2016-11-20 ENCOUNTER — Emergency Department: Payer: Medicare Other

## 2016-11-20 ENCOUNTER — Inpatient Hospital Stay
Admission: EM | Admit: 2016-11-20 | Discharge: 2016-11-22 | DRG: 760 | Disposition: A | Discharge status: Home / Self Care | Payer: Medicare Other | Attending: Internal Medicine | Admitting: Internal Medicine

## 2016-11-20 ENCOUNTER — Encounter: Payer: Self-pay | Admitting: Emergency Medicine

## 2016-11-20 DIAGNOSIS — N839 Noninflammatory disorder of ovary, fallopian tube and broad ligament, unspecified: Secondary | ICD-10-CM | POA: Diagnosis present

## 2016-11-20 DIAGNOSIS — E039 Hypothyroidism, unspecified: Secondary | ICD-10-CM | POA: Diagnosis present

## 2016-11-20 DIAGNOSIS — Z8249 Family history of ischemic heart disease and other diseases of the circulatory system: Secondary | ICD-10-CM | POA: Diagnosis not present

## 2016-11-20 DIAGNOSIS — Z923 Personal history of irradiation: Secondary | ICD-10-CM | POA: Diagnosis not present

## 2016-11-20 DIAGNOSIS — E785 Hyperlipidemia, unspecified: Secondary | ICD-10-CM | POA: Diagnosis present

## 2016-11-20 DIAGNOSIS — I350 Nonrheumatic aortic (valve) stenosis: Secondary | ICD-10-CM | POA: Diagnosis present

## 2016-11-20 DIAGNOSIS — N390 Urinary tract infection, site not specified: Secondary | ICD-10-CM | POA: Diagnosis present

## 2016-11-20 DIAGNOSIS — N838 Other noninflammatory disorders of ovary, fallopian tube and broad ligament: Secondary | ICD-10-CM

## 2016-11-20 DIAGNOSIS — Z853 Personal history of malignant neoplasm of breast: Secondary | ICD-10-CM | POA: Diagnosis not present

## 2016-11-20 DIAGNOSIS — I129 Hypertensive chronic kidney disease with stage 1 through stage 4 chronic kidney disease, or unspecified chronic kidney disease: Secondary | ICD-10-CM

## 2016-11-20 DIAGNOSIS — R971 Elevated cancer antigen 125 [CA 125]: Secondary | ICD-10-CM

## 2016-11-20 DIAGNOSIS — Z87440 Personal history of urinary (tract) infections: Secondary | ICD-10-CM

## 2016-11-20 DIAGNOSIS — Z9221 Personal history of antineoplastic chemotherapy: Secondary | ICD-10-CM

## 2016-11-20 DIAGNOSIS — K449 Diaphragmatic hernia without obstruction or gangrene: Secondary | ICD-10-CM | POA: Diagnosis present

## 2016-11-20 DIAGNOSIS — R180 Malignant ascites: Secondary | ICD-10-CM

## 2016-11-20 DIAGNOSIS — N183 Chronic kidney disease, stage 3 (moderate): Secondary | ICD-10-CM | POA: Diagnosis present

## 2016-11-20 DIAGNOSIS — R188 Other ascites: Secondary | ICD-10-CM

## 2016-11-20 DIAGNOSIS — R0602 Shortness of breath: Secondary | ICD-10-CM | POA: Diagnosis present

## 2016-11-20 DIAGNOSIS — R1013 Epigastric pain: Secondary | ICD-10-CM | POA: Diagnosis not present

## 2016-11-20 DIAGNOSIS — R109 Unspecified abdominal pain: Secondary | ICD-10-CM

## 2016-11-20 HISTORY — DX: Essential (primary) hypertension: I10

## 2016-11-20 LAB — COMPREHENSIVE METABOLIC PANEL
ALBUMIN: 3.4 g/dL — AB (ref 3.5–5.0)
ALK PHOS: 69 U/L (ref 38–126)
ALT: 18 U/L (ref 14–54)
AST: 36 U/L (ref 15–41)
Anion gap: 10 (ref 5–15)
BILIRUBIN TOTAL: 1.5 mg/dL — AB (ref 0.3–1.2)
BUN: 17 mg/dL (ref 6–20)
CALCIUM: 8.9 mg/dL (ref 8.9–10.3)
CO2: 25 mmol/L (ref 22–32)
Chloride: 102 mmol/L (ref 101–111)
Creatinine, Ser: 1.21 mg/dL — ABNORMAL HIGH (ref 0.44–1.00)
GFR calc Af Amer: 47 mL/min — ABNORMAL LOW (ref >60)
GFR calc non Af Amer: 41 mL/min — ABNORMAL LOW (ref >60)
GLUCOSE: 117 mg/dL — AB (ref 65–99)
POTASSIUM: 4.2 mmol/L (ref 3.5–5.1)
SODIUM: 137 mmol/L (ref 135–145)
Total Protein: 7.8 g/dL (ref 6.5–8.1)

## 2016-11-20 LAB — URINALYSIS, COMPLETE (UACMP) WITH MICROSCOPIC
BILIRUBIN URINE: NEGATIVE
Glucose, UA: NEGATIVE mg/dL
KETONES UR: NEGATIVE mg/dL
NITRITE: NEGATIVE
PROTEIN: NEGATIVE mg/dL
SPECIFIC GRAVITY, URINE: 1.023 (ref 1.005–1.030)
Squamous Epithelial / LPF: NONE SEEN
pH: 6 (ref 5.0–8.0)

## 2016-11-20 LAB — CBC
HEMATOCRIT: 41.3 % (ref 35.0–47.0)
Hemoglobin: 13.6 g/dL (ref 12.0–16.0)
MCH: 30.2 pg (ref 26.0–34.0)
MCHC: 32.9 g/dL (ref 32.0–36.0)
MCV: 91.8 fL (ref 80.0–100.0)
Platelets: 365 10*3/uL (ref 150–440)
RBC: 4.5 MIL/uL (ref 3.80–5.20)
RDW: 13.5 % (ref 11.5–14.5)
WBC: 7.6 10*3/uL (ref 3.6–11.0)

## 2016-11-20 LAB — TROPONIN I: Troponin I: 0.03 ng/mL (ref <0.03)

## 2016-11-20 LAB — LIPASE, BLOOD: Lipase: 24 U/L (ref 11–51)

## 2016-11-20 MED ORDER — SODIUM CHLORIDE 0.9 % IV SOLN
INTRAVENOUS | Status: DC
  Administered 2016-11-20 – 2016-11-21 (×2): via INTRAVENOUS

## 2016-11-20 MED ORDER — HEPARIN SODIUM (PORCINE) 5000 UNIT/ML IJ SOLN
5000.0000 [IU] | Freq: Three times a day (TID) | INTRAMUSCULAR | Status: DC
  Administered 2016-11-20 – 2016-11-21 (×5): 5000 [IU] via SUBCUTANEOUS
  Filled 2016-11-20 (×4): qty 1

## 2016-11-20 MED ORDER — BISACODYL 10 MG RE SUPP: 10.0000 mg | Freq: Every day | RECTAL | Status: DC | PRN

## 2016-11-20 MED ORDER — FAMOTIDINE IN NACL 20-0.9 MG/50ML-% IV SOLN
INTRAVENOUS | Status: AC
  Filled 2016-11-20: qty 50

## 2016-11-20 MED ORDER — CEFTRIAXONE SODIUM IN DEXTROSE 20 MG/ML IV SOLN
1.0000 g | INTRAVENOUS | Status: DC
  Administered 2016-11-20: 1 g via INTRAVENOUS
  Filled 2016-11-20 (×2): qty 50

## 2016-11-20 MED ORDER — LEVOTHYROXINE SODIUM 50 MCG PO TABS
50.0000 ug | ORAL_TABLET | Freq: Every day | ORAL | Status: DC
  Administered 2016-11-21: 50 ug via ORAL
  Filled 2016-11-20: qty 1

## 2016-11-20 MED ORDER — ONDANSETRON HCL 4 MG PO TABS: 4.0000 mg | ORAL_TABLET | Freq: Four times a day (QID) | ORAL | Status: DC | PRN

## 2016-11-20 MED ORDER — DILTIAZEM HCL ER COATED BEADS 180 MG PO CP24
180.0000 mg | ORAL_CAPSULE | Freq: Every day | ORAL | Status: DC
  Filled 2016-11-20: qty 1

## 2016-11-20 MED ORDER — HEPARIN SODIUM (PORCINE) 5000 UNIT/ML IJ SOLN
INTRAMUSCULAR | Status: AC
  Administered 2016-11-20: 5000 [IU] via SUBCUTANEOUS
  Filled 2016-11-20: qty 1

## 2016-11-20 MED ORDER — IOPAMIDOL (ISOVUE-300) INJECTION 61%
30.0000 mL | Freq: Once | INTRAVENOUS | Status: AC | PRN
  Administered 2016-11-20: 30 mL via ORAL

## 2016-11-20 MED ORDER — DOCUSATE SODIUM 100 MG PO CAPS
100.0000 mg | ORAL_CAPSULE | Freq: Two times a day (BID) | ORAL | Status: DC
  Administered 2016-11-21 – 2016-11-22 (×2): 100 mg via ORAL
  Filled 2016-11-20 (×3): qty 1

## 2016-11-20 MED ORDER — ATORVASTATIN CALCIUM 20 MG PO TABS
40.0000 mg | ORAL_TABLET | Freq: Every day | ORAL | Status: DC
  Administered 2016-11-21: 40 mg via ORAL
  Filled 2016-11-20: qty 2

## 2016-11-20 MED ORDER — ACETAMINOPHEN 325 MG PO TABS: 650.0000 mg | ORAL_TABLET | Freq: Four times a day (QID) | ORAL | Status: DC | PRN

## 2016-11-20 MED ORDER — SODIUM CHLORIDE 0.9 % IV BOLUS (SEPSIS)
500.0000 mL | Freq: Once | INTRAVENOUS | Status: AC
  Administered 2016-11-20: 500 mL via INTRAVENOUS

## 2016-11-20 MED ORDER — MORPHINE SULFATE (PF) 2 MG/ML IV SOLN: 2.0000 mg | INTRAVENOUS | Status: DC | PRN

## 2016-11-20 MED ORDER — ACETAMINOPHEN 650 MG RE SUPP: 650.0000 mg | Freq: Four times a day (QID) | RECTAL | Status: DC | PRN

## 2016-11-20 MED ORDER — DILTIAZEM HCL ER COATED BEADS 180 MG PO CP24
180.0000 mg | ORAL_CAPSULE | Freq: Every day | ORAL | Status: DC
  Administered 2016-11-21 – 2016-11-22 (×2): 180 mg via ORAL
  Filled 2016-11-20 (×2): qty 1

## 2016-11-20 MED ORDER — IPRATROPIUM-ALBUTEROL 0.5-2.5 (3) MG/3ML IN SOLN
3.0000 mL | Freq: Four times a day (QID) | RESPIRATORY_TRACT | Status: DC
  Administered 2016-11-20 – 2016-11-21 (×3): 3 mL via RESPIRATORY_TRACT
  Filled 2016-11-20 (×3): qty 3

## 2016-11-20 MED ORDER — SIMETHICONE 80 MG PO CHEW
160.0000 mg | CHEWABLE_TABLET | Freq: Four times a day (QID) | ORAL | Status: DC | PRN
  Filled 2016-11-20: qty 2

## 2016-11-20 MED ORDER — FAMOTIDINE IN NACL 20-0.9 MG/50ML-% IV SOLN
20.0000 mg | Freq: Two times a day (BID) | INTRAVENOUS | Status: DC
  Administered 2016-11-21: 20 mg via INTRAVENOUS
  Filled 2016-11-20 (×4): qty 50

## 2016-11-20 MED ORDER — IOPAMIDOL (ISOVUE-370) INJECTION 76%
75.0000 mL | Freq: Once | INTRAVENOUS | Status: AC | PRN
  Administered 2016-11-20: 75 mL via INTRAVENOUS

## 2016-11-20 MED ORDER — ONDANSETRON HCL 4 MG/2ML IJ SOLN
4.0000 mg | Freq: Four times a day (QID) | INTRAMUSCULAR | Status: DC | PRN
Start: 2016-11-20 — End: 2016-11-22
  Administered 2016-11-20 – 2016-11-21 (×2): 4 mg via INTRAVENOUS
  Filled 2016-11-20 (×2): qty 2

## 2016-11-20 NOTE — ED Notes (Signed)
Pt transferred to hospital bed for comfort while waiting for IP bed assignment.

## 2016-11-20 NOTE — ED Triage Notes (Signed)
States increasing SOB x 1 month. Arrives speaking rapidly in full sentences and states is anxious. SAT 97 room air. States has been having increasing abdominal girth for past month also.

## 2016-11-20 NOTE — ED Notes (Signed)
She[place] RN aware of bed assigned

## 2016-11-20 NOTE — ED Provider Notes (Signed)
d  [name] Emergency Department Provider Note  ____________________________________________   First MD Initiated Contact with Patient 11/20/16 [phone]     (approximate)  I have reviewed the triage vital signs and the nursing notes.   HISTORY  Chief Complaint Shortness of Breath   HPI Ebony Navarro is a 83 y.o. female with a history of breast cancer now on remission is presenting to the emergency department with 1 month of increasing abdominal distention and shortness of breath and the patient believes is from her increasing abdominal girth. She says that she has had recent diarrhea and now feels a lump on her rectum. Also with an episode of vomiting yesterday. Says that she is also belching forcefully and with frequency. Denies any pain. Denies any increased fluid intake. Says that she has had a cough without any fever. Does not report any sputum production.   Past Medical History:  Diagnosis Date  . Breast cancer ([place]) 2005   Right, radiation and lumpectomy  . Breast cancer ([place]) 2002   Left, Chemo, radiation and lumpectomy  . Cancer ([place])   . Hypertension     Patient Active Problem List   Diagnosis Date Noted  . Absolute anemia 01/22/2015  . Anxiety 01/22/2015  . Malignant neoplasm of breast ([place]) 01/22/2015  . Clinical depression 01/22/2015  . Gastric catarrh 01/22/2015  . Blood glucose elevated 01/22/2015  . HLD (hyperlipidemia) 01/22/2015  . BP (high blood pressure) 01/22/2015  . Acquired atrophy of thyroid 04/02/2014  . Frequent UTI 11/23/2013  . Chronic kidney disease (CKD), stage III (moderate) ([place]) 10/02/2013    Past Surgical History:  Procedure Laterality Date  . BREAST BIOPSY Left 2010   negative stereotactic biopsy  . BREAST LUMPECTOMY Right 2005   positive  . BREAST LUMPECTOMY Left 2002   positive    Prior to Admission medications   Medication Sig Start Date End Date Taking? Authorizing Provider  atorvastatin  (LIPITOR) 40 MG tablet Take 40 mg by mouth daily.    Yes [provider]  Cholecalciferol (VITAMIN D3) 1000 units CAPS Take 1,000 Units by mouth daily.   Yes [provider]  diltiazem (CARDIZEM CD) 120 MG 24 hr capsule TAKE 1 CAPSULE BY MOUTH EVERY OTHER DAY 10/09/14  Yes [provider]  levothyroxine (SYNTHROID, LEVOTHROID) 50 MCG tablet TAKE 1 TABLET BY MOUTH DAILY ON AN EMPTY STOMACH WITH A GLASS OF WATER 30-60 MIN BEFORE BREAKFAST 11/11/14  Yes [provider]  Multiple Vitamin (MULTI-VITAMINS) TABS Take 1 tablet by mouth daily.   Yes [provider]  omeprazole (PRILOSEC) 20 MG capsule Take 20 mg by mouth 2 (two) times daily. 11/02/16  Yes [provider]  trimethoprim (TRIMPEX) 100 MG tablet Take 1 tablet (100 mg total) by mouth 2 (two) times daily. Patient not taking: Reported on 11/20/2016 01/27/15   Rubie Maid, MD    Allergies Ace inhibitors; Atenolol; Iodine; Metoprolol tartrate; Omeprazole; Povidone-iodine; Quinolones; Rofecoxib; Ciprofloxacin; Doxycycline calcium; Nitrofurantoin; and Sulfa antibiotics  Family History  Problem Relation Age of Onset  . Hypertension Mother   . Hypertension Father     Social History Social History  Substance Use Topics  . Smoking status: Never Smoker  . Smokeless tobacco: Not on file  . Alcohol use No    Review of Systems  Constitutional: No fever/chills Eyes: No visual changes. ENT: No sore throat. Cardiovascular: Denies chest pain. Respiratory: as above. Gastrointestinal: No abdominal pain.  No constipation. Genitourinary: Negative for dysuria. Musculoskeletal: Negative  for back pain. Skin: Negative for rash. Neurological: Negative for headaches, focal weakness or numbness.   ____________________________________________   PHYSICAL EXAM:  VITAL SIGNS: ED Triage Vitals  Enc Vitals Group     BP 11/20/16 0827 (!) 161/80     Pulse Rate 11/20/16 0827 (!) 110     Resp  11/20/16 0827 (!) 22     Temp 11/20/16 0827 98.1 F (36.7 C)     Temp Source 11/20/16 0827 Oral     SpO2 11/20/16 0827 97 %     Weight 11/20/16 0828 165 lb (74.8 kg)     Height 11/20/16 0828 5\' 5"  (1.651 m)     Head Circumference --      Peak Flow --      Pain Score --      Pain Loc --      Pain Edu? --      Excl. in [place]? --    Constitutional: Alert and oriented. Well appearing and in no acute distress.belches loudly several times during the exam. Eyes: Conjunctivae are normal.  Head: Atraumatic. Nose: No congestion/rhinnorhea. Mouth/Throat: Mucous membranes are moist.  Neck: No stridor.   Cardiovascular: tachycardic, regular rhythm. Grossly normal heart sounds.   Respiratory: tachypnea. No retractions. minimal rales to the bilateral bases. Gastrointestinal: Soft and nontender. moderately distended but not tense. Musculoskeletal: No lower extremity tenderness nor edema.  No joint effusions. Neurologic:  Normal speech and language. No gross focal neurologic deficits are appreciated. Skin:  Skin is warm, dry and intact. No rash noted. Psychiatric: Mood and affect are normal. Speech and behavior are normal.  ____________________________________________   LABS (all labs ordered are listed, but only abnormal results are displayed)  Labs Reviewed  TROPONIN I - Abnormal; Notable for the following:       Result Value   Troponin I 0.03 (*)    All other components within normal limits  COMPREHENSIVE METABOLIC PANEL - Abnormal; Notable for the following:    Glucose, Bld 117 (*)    Creatinine, Ser 1.21 (*)    Albumin 3.4 (*)    Total Bilirubin 1.5 (*)    GFR calc non Af Amer 41 (*)    GFR calc Af Amer 47 (*)    All other components within normal limits  CBC  LIPASE, BLOOD  URINALYSIS, COMPLETE (UACMP) WITH MICROSCOPIC   ____________________________________________  EKG  ED ECG REPORT I, Doran Stabler, the attending physician, personally viewed and interpreted this  ECG.   Date: 11/20/2016  EKG Time: 829  Rate: 107  Rhythm: sinus tachycardia  Axis: left axis  Intervals:none  ST&T Change: no ST segment elevation or depression. No abnormal T-wave inversion.  ____________________________________________  RADIOLOGY  trace bilateral pleural effusions  No definitive pulmonary embolus on the CT of the chest. Abdominal CT with 7.1 x 4.4 cm lobulated right adnexal mass concerning for ovarian malignancy. Associated with moderate ascites. ____________________________________________   PROCEDURES  Procedure(s) performed:   Procedures  Critical Care performed:   ____________________________________________   INITIAL IMPRESSION / ASSESSMENT AND PLAN / ED COURSE  Pertinent labs & imaging results that were available during my care of the patient were reviewed by me and considered in my medical decision making (see chart for details).  Differential includes, but is not limited to, viral syndrome, bronchitis including COPD exacerbation, pneumonia, reactive airway disease including asthma, CHF including exacerbation with or without pulmonary/interstitial edema, pneumothorax, ACS, thoracic trauma, and pulmonary embolism.  abdominal distention differential diagnoses includes recurrent  cancer with metastatic disease causing bowel obstruction, ascites, mass effect  As part of my medical decision making, I reviewed the following data within the [place] reviewed including creatinine of 1.21. Previous charts also reviewed.     ----------------------------------------- 2:02 PM on 11/20/2016 -----------------------------------------  Patient continues without any respiratory distress. We discussed the exam findings with likely new ovarian mass causing the ascites. I also discussed the case with Dr. Tasia Catchings of oncology who says she will consult and see the patient this afternoon. Patient likely with her ascites causing her shortness of  breath and tachycardia. Patient is not hypoxic. She'll be admitted to the hospital. Signed out to Dr. Doy Hutching.  ____________________________________________   FINAL CLINICAL IMPRESSION(S) / ED DIAGNOSES  ovarian mass. Ascites. Shortness of breath.    NEW MEDICATIONS STARTED DURING THIS VISIT:  New Prescriptions   No medications on file     Note:  This document was prepared using Dragon voice recognition software and may include unintentional dictation errors.     Orbie Pyo, MD 11/20/16 [phone]

## 2016-11-20 NOTE — Consult Note (Signed)
Hematology/Oncology Consult note [facility] [phone] [phone]  Patient Care Team: Adin Hector, MD as PCP - General (Internal Medicine)   Name of the patient: Ebony Navarro  [id number]  1933/12/09   Date of visit: 11/20/2016 REASON FOR COSULTATION:  Ovarian mass History of presenting illness-  This is a 83 year old female who has a history of remote breast cancer, hypertension, thyroid disease presented with 2 weeks of increased abdominal distention/discomfort/dyspepsia and shortness of breath. Patient was found to have large amount of ascites. CT abdomen and pelvis with contrast showed 7.1 x 4.4 cm lobulated right ovarian mass concerning for ovarian malignancy. There is also moderate ascites. There is also moderate wall thickening of distal esophagus plus concerning for inflammation or malignancy. Patient reports that she has been her usual health until couple of weeks ago her abdomen started to be more distended with worsening of abdomen discomfort.she is also belching forcefully and with frequency.  Patient's left breast cancer was in 2002, treatment including lumpectomy, chemotherapy and radiation. She also developed right side breast cancer in 2005 status post lumpectomy and radiation.  Review of systems- ROS   Constitutional: Negative for fever, night sweats,unintentional weight loss, positive change in appetite. Positive fatigue HENT: Negative for ear pain, hearing loss, nasal bleeding Eyes: Negative for eye pain, double vision   Respiratory: Negative for wheezing, positive for shortness of breath, negative for cough Cardiovascular: Negative for chest pain, palpitation.   Gastrointestinal: Positive for abdominal distention, nauseated, blenching Endocrine: Negative  Genitourinary: Negative for dysuria, hematuria, frequency Skin: Negative for rash, iching, bruising Neurological: Negative for headache, dizziness,  seizure Hematological: Negative for easy bruising/bleeding, lymph node enlargement Psychiatric/Behavioral: Negative for depression, anxiety, suicidality Allergies  Allergen Reactions  . Ace Inhibitors Other (See Comments)    hyperkalemia  . Atenolol Other (See Comments)    Worsening palpitations  . Iodine Other (See Comments)    11/20/16: per conversation with pt, pt with allergy to topical iodine and betadine.  Pt states she has had IV contrast in the past with now issues.  . Metoprolol Tartrate Other (See Comments)    intolerant  . Omeprazole Diarrhea  . Povidone-Iodine Other (See Comments)  . Quinolones Other (See Comments)  . Rofecoxib Nausea Only  . Ciprofloxacin Rash  . Doxycycline Calcium Rash  . Nitrofurantoin Rash  . Sulfa Antibiotics Rash    Patient Active Problem List   Diagnosis Date Noted  . Abdominal pain 11/20/2016  . Ascites 11/20/2016  . Ovarian mass 11/20/2016  . Absolute anemia 01/22/2015  . Anxiety 01/22/2015  . Malignant neoplasm of breast ([place]) 01/22/2015  . Clinical depression 01/22/2015  . Gastric catarrh 01/22/2015  . Blood glucose elevated 01/22/2015  . HLD (hyperlipidemia) 01/22/2015  . BP (high blood pressure) 01/22/2015  . Acquired atrophy of thyroid 04/02/2014  . Frequent UTI 11/23/2013  . Chronic kidney disease (CKD), stage III (moderate) ([place]) 10/02/2013     Past Medical History:  Diagnosis Date  . Breast cancer ([place]) 2005   Right, radiation and lumpectomy  . Breast cancer ([place]) 2002   Left, Chemo, radiation and lumpectomy  . Cancer ([place])   . Hypertension      Past Surgical History:  Procedure Laterality Date  . BREAST BIOPSY Left 2010   negative stereotactic biopsy  . BREAST LUMPECTOMY Right 2005   positive  . BREAST LUMPECTOMY Left 2002   positive    Social History   Social History  . Marital status: Widowed  Spouse name: N/A  . Number of children: N/A  . Years of education: N/A   Occupational History  . Not  on file.   Social History Main Topics  . Smoking status: Never Smoker  . Smokeless tobacco: Never Used  . Alcohol use No  . Drug use: No  . Sexual activity: No   Other Topics Concern  . Not on file   Social History Narrative  . No narrative on file     Family History  Problem Relation Age of Onset  . Hypertension Mother   . Hypertension Father      Current Facility-Administered Medications:  .  0.9 %  sodium chloride infusion, , Intravenous, Continuous, Sparks, Leonie Douglas, MD, Last Rate: 75 mL/hr at 11/20/16 1633 .  acetaminophen (TYLENOL) tablet 650 mg, 650 mg, Oral, Q6H PRN **OR** acetaminophen (TYLENOL) suppository 650 mg, 650 mg, Rectal, Q6H PRN, Idelle Crouch, MD .  atorvastatin (LIPITOR) tablet 40 mg, 40 mg, Oral, Daily, Sparks, Leonie Douglas, MD .  bisacodyl (DULCOLAX) suppository 10 mg, 10 mg, Rectal, Daily PRN, Idelle Crouch, MD .  cefTRIAXone (ROCEPHIN) 1 g in dextrose 5 % 50 mL IVPB - Premix, 1 g, Intravenous, Q24H, Idelle Crouch, MD, Last Rate: 100 mL/hr at 11/20/16 1600, 1 g at 11/20/16 1600 .  diltiazem (CARDIZEM CD) 24 hr capsule 180 mg, 180 mg, Oral, Daily, Sparks, Leonie Douglas, MD .  docusate sodium (COLACE) capsule 100 mg, 100 mg, Oral, BID, Sparks, Leonie Douglas, MD .  famotidine (PEPCID) IVPB 20 mg premix, 20 mg, Intravenous, Q12H, Sparks, Jeffrey D, MD .  heparin injection 5,000 Units, 5,000 Units, Subcutaneous, Q8H, Sparks, Dellis Filbert D, MD .  ipratropium-albuterol (DUONEB) 0.5-2.5 (3) MG/3ML nebulizer solution 3 mL, 3 mL, Nebulization, QID, Idelle Crouch, MD, 3 mL at 11/20/16 1634 .  [START ON 11/21/2016] levothyroxine (SYNTHROID, LEVOTHROID) tablet 50 mcg, 50 mcg, Oral, QAC breakfast, Sparks, Dellis Filbert D, MD .  morphine 2 MG/ML injection 2 mg, 2 mg, Intravenous, Q2H PRN, Idelle Crouch, MD .  ondansetron (ZOFRAN) tablet 4 mg, 4 mg, Oral, Q6H PRN **OR** ondansetron (ZOFRAN) injection 4 mg, 4 mg, Intravenous, Q6H PRN, Idelle Crouch, MD, 4 mg at 11/20/16  1651 .  simethicone (MYLICON) chewable tablet 160 mg, 160 mg, Oral, QID PRN, Idelle Crouch, MD  Current Outpatient Prescriptions:  .  atorvastatin (LIPITOR) 40 MG tablet, Take 40 mg by mouth daily. , Disp: , Rfl:  .  Cholecalciferol (VITAMIN D3) 1000 units CAPS, Take 1,000 Units by mouth daily., Disp: , Rfl:  .  diltiazem (CARDIZEM CD) 120 MG 24 hr capsule, TAKE 1 CAPSULE BY MOUTH EVERY OTHER DAY, Disp: , Rfl:  .  levothyroxine (SYNTHROID, LEVOTHROID) 50 MCG tablet, TAKE 1 TABLET BY MOUTH DAILY ON AN EMPTY STOMACH WITH A GLASS OF WATER 30-60 MIN BEFORE BREAKFAST, Disp: , Rfl:  .  Multiple Vitamin (MULTI-VITAMINS) TABS, Take 1 tablet by mouth daily., Disp: , Rfl:  .  omeprazole (PRILOSEC) 20 MG capsule, Take 20 mg by mouth 2 (two) times daily., Disp: , Rfl: 1 .  trimethoprim (TRIMPEX) 100 MG tablet, Take 1 tablet (100 mg total) by mouth 2 (two) times daily. (Patient not taking: Reported on 11/20/2016), Disp: 20 tablet, Rfl: 0   Physical exam:  Vitals:   11/20/16 1300 11/20/16 1330 11/20/16 1430 11/20/16 1500  BP: (!) 142/73 140/75 134/82 (!) 151/87  Pulse: 91 95 100 (!) 103  Resp: 15 18 17 15   Temp:  TempSrc:      SpO2: 96% 94% 94% 94%  Weight:      Height:       GENERAL:Alert, no distress and comfortable.  EYES: no pallor or icterus OROPHARYNX: no thrush or ulceration; good dentition  NECK: supple, no masses felt UNGS: clear to auscultation and  No wheeze or crackles HEART/CVS: regular rate & rhythm and no murmurs; No lower extremity edema ABDOMEN: abdomen Distended. Musculoskeletal:no cyanosis of digits and no clubbing  PSYCH: alert & oriented x 3  NEURO: no focal motor/sensory deficits SKIN:  no rashes or significant lesions     CMP Latest Ref Rng & Units 11/20/2016  Glucose 65 - 99 mg/dL 117(H)  BUN 6 - 20 mg/dL 17  Creatinine 0.44 - 1.00 mg/dL 1.21(H)  Sodium 135 - 145 mmol/L 137  Potassium 3.5 - 5.1 mmol/L 4.2  Chloride 101 - 111 mmol/L 102  CO2 22 - 32  mmol/L 25  Calcium 8.9 - 10.3 mg/dL 8.9  Total Protein 6.5 - 8.1 g/dL 7.8  Total Bilirubin 0.3 - 1.2 mg/dL 1.5(H)  Alkaline Phos 38 - 126 U/L 69  AST 15 - 41 U/L 36  ALT 14 - 54 U/L 18   CBC Latest Ref Rng & Units 11/20/2016  WBC 3.6 - 11.0 K/uL 7.6  Hemoglobin 12.0 - 16.0 g/dL 13.6  Hematocrit 35.0 - 47.0 % 41.3  Platelets 150 - 440 K/uL 365    @IMAGES @  Dg Chest 2 View  Result Date: 11/20/2016 CLINICAL DATA:  Increasing shortness of breath over the past month. EXAM: CHEST  2 VIEW COMPARISON:  Chest x-ray dated August 27, 2012. FINDINGS: The cardiomediastinal silhouette is normal in size. Normal pulmonary vascularity. Trace bilateral pleural effusions. No focal consolidation or pneumothorax. No acute osseous abnormality. IMPRESSION: Trace bilateral pleural effusions. Electronically Signed   By: Titus Dubin M.D.   On: 11/20/2016 09:45   Ct Angio Chest Pe W And/or Wo Contrast  Result Date: 11/20/2016 CLINICAL DATA:  Shortness of breath.  Abdominal distension. EXAM: CT ANGIOGRAPHY CHEST CT ABDOMEN AND PELVIS WITH CONTRAST TECHNIQUE: Multidetector CT imaging of the chest was performed using the standard protocol during bolus administration of intravenous contrast. Multiplanar CT image reconstructions and MIPs were obtained to evaluate the vascular anatomy. Multidetector CT imaging of the abdomen and pelvis was performed using the standard protocol during bolus administration of intravenous contrast. CONTRAST:  75 mL of Isovue 370 intravenously. COMPARISON:  Radiographs of same day.  CT scan of Jun 21, 2013. FINDINGS: CTA CHEST FINDINGS Cardiovascular: Satisfactory opacification of the pulmonary arteries to the segmental level. No evidence of pulmonary embolism. Normal heart size. No pericardial effusion. Atherosclerosis of thoracic aorta is noted without aneurysm formation. Coronary artery calcifications are noted. Mediastinum/Nodes: Moderate size sliding-type hiatal hernia is noted. Wall  thickening of distal esophagus is noted concerning for inflammation. Thyroid gland is unremarkable. No mediastinal adenopathy is noted. Lungs/Pleura: No pneumothorax is noted. Mild bilateral pleural effusions are noted with adjacent subsegmental atelectasis. Musculoskeletal: No chest wall abnormality. No acute or significant osseous findings. Postsurgical changes are seen involving both breasts. Review of the MIP images confirms the above findings. CT ABDOMEN and PELVIS FINDINGS Hepatobiliary: Minimal cholelithiasis is noted. No abnormality is seen in the liver. Pancreas: Stable 3.3 cm cystic lesion seen arising from pancreatic tail. No ductal dilatation or inflammation is noted. Spleen: Normal in size without focal abnormality. Adrenals/Urinary Tract: Adrenal glands appear normal. Stable bilateral renal cysts are noted. No hydronephrosis or renal obstruction is  noted. No renal or ureteral calculi are noted. Urinary bladder is mildly distended, with air-fluid level present superiorly, suggesting recent instrumentation. Stomach/Bowel: Stomach is within normal limits. Appendix appears normal. No evidence of bowel wall thickening, distention, or inflammatory changes. Vascular/Lymphatic: Aortic atherosclerosis. No enlarged abdominal or pelvic lymph nodes. Reproductive: 7.1 x 4.7 cm lobulated and heterogeneously enhancing right adnexal mass is noted concerning for ovarian malignancy. Uterus and left ovary are unremarkable. Other: Moderate ascites is noted.  No definite hernia is noted. Musculoskeletal: No acute or significant osseous findings. Review of the MIP images confirms the above findings. IMPRESSION: No definite evidence of pulmonary embolus. 7.1 x 4.4 cm lobulated right adnexal mass is noted concerning for ovarian malignancy. Moderate ascites is noted. Aortic atherosclerosis. Minimal cholelithiasis. Moderate size sliding-type hiatal hernia is noted. Moderate wall thickening of distal esophagus is noted  concerning for inflammation or neoplasm. Endoscopy is recommended for further evaluation. Stable 3.3 cm cystic lesion arising from pancreatic tail. Coronary artery calcifications are noted suggesting coronary artery disease. Electronically Signed   By: Marijo Conception, M.D.   On: 11/20/2016 13:31   Ct Abdomen Pelvis W Contrast  Result Date: 11/20/2016 CLINICAL DATA:  Shortness of breath.  Abdominal distension. EXAM: CT ANGIOGRAPHY CHEST CT ABDOMEN AND PELVIS WITH CONTRAST TECHNIQUE: Multidetector CT imaging of the chest was performed using the standard protocol during bolus administration of intravenous contrast. Multiplanar CT image reconstructions and MIPs were obtained to evaluate the vascular anatomy. Multidetector CT imaging of the abdomen and pelvis was performed using the standard protocol during bolus administration of intravenous contrast. CONTRAST:  75 mL of Isovue 370 intravenously. COMPARISON:  Radiographs of same day.  CT scan of Jun 21, 2013. FINDINGS: CTA CHEST FINDINGS Cardiovascular: Satisfactory opacification of the pulmonary arteries to the segmental level. No evidence of pulmonary embolism. Normal heart size. No pericardial effusion. Atherosclerosis of thoracic aorta is noted without aneurysm formation. Coronary artery calcifications are noted. Mediastinum/Nodes: Moderate size sliding-type hiatal hernia is noted. Wall thickening of distal esophagus is noted concerning for inflammation. Thyroid gland is unremarkable. No mediastinal adenopathy is noted. Lungs/Pleura: No pneumothorax is noted. Mild bilateral pleural effusions are noted with adjacent subsegmental atelectasis. Musculoskeletal: No chest wall abnormality. No acute or significant osseous findings. Postsurgical changes are seen involving both breasts. Review of the MIP images confirms the above findings. CT ABDOMEN and PELVIS FINDINGS Hepatobiliary: Minimal cholelithiasis is noted. No abnormality is seen in the liver. Pancreas:  Stable 3.3 cm cystic lesion seen arising from pancreatic tail. No ductal dilatation or inflammation is noted. Spleen: Normal in size without focal abnormality. Adrenals/Urinary Tract: Adrenal glands appear normal. Stable bilateral renal cysts are noted. No hydronephrosis or renal obstruction is noted. No renal or ureteral calculi are noted. Urinary bladder is mildly distended, with air-fluid level present superiorly, suggesting recent instrumentation. Stomach/Bowel: Stomach is within normal limits. Appendix appears normal. No evidence of bowel wall thickening, distention, or inflammatory changes. Vascular/Lymphatic: Aortic atherosclerosis. No enlarged abdominal or pelvic lymph nodes. Reproductive: 7.1 x 4.7 cm lobulated and heterogeneously enhancing right adnexal mass is noted concerning for ovarian malignancy. Uterus and left ovary are unremarkable. Other: Moderate ascites is noted.  No definite hernia is noted. Musculoskeletal: No acute or significant osseous findings. Review of the MIP images confirms the above findings. IMPRESSION: No definite evidence of pulmonary embolus. 7.1 x 4.4 cm lobulated right adnexal mass is noted concerning for ovarian malignancy. Moderate ascites is noted. Aortic atherosclerosis. Minimal cholelithiasis. Moderate size sliding-type hiatal hernia is noted.  Moderate wall thickening of distal esophagus is noted concerning for inflammation or neoplasm. Endoscopy is recommended for further evaluation. Stable 3.3 cm cystic lesion arising from pancreatic tail. Coronary artery calcifications are noted suggesting coronary artery disease. Electronically Signed   By: Marijo Conception, M.D.   On: 11/20/2016 13:31    Assessment and plan- Patient is a 83 y.o. female or has a remote history of breast cancer now presenting with 2-3 weeks of abdominal distention, found to have ascites and adnexal mass on CT.  1 Ascites and adnexal mass, likely ovarian cancer: Agree with ultrasound-guided diagnostic  and therapeutic paracentesis, send fluid for cytology. Check CEA 125. 2. History of breast cancer of both breasts: Does not have patient's previous history/treatment note and pathology. I will obtain the detailed history of her treatment on Monday. 3 thickening of distal esophagus, dyspepsia: On Pepcid. Consider GI workup..  Thank you for this kind referral and the opportunity to participate in the care of this patient. I will follow along her inpatient course.  Dr. Earlie Server, MD, PhD [facility] at [facility] Pager- [phone] 11/20/2016

## 2016-11-20 NOTE — ED Notes (Signed)
Pt

## 2016-11-20 NOTE — H&P (Signed)
History and Physical    Ebony Navarro HYW:[id number] DOB: 1933/03/31 DOA: 11/20/2016  Referring physician: Dr. Clearnce Hasten PCP: Adin Hector, MD  Specialists: none  Chief Complaint: abdominal pain and distention  HPI: Ebony Navarro is a 83 y.o. female has a past medical history significant for HTN, breast cancer, and thyroid disease now with 2 week hx of increased abdominal distention with pain and dyspepsia and worsening SOB. In ER, pt found to have a right ovarian mass with a large amount of ascites. She is now admitted. No fever. Some nausea but no vomiting or diarrhea. Denies CP.  Review of Systems: The patient denies anorexia, fever, weight loss,, vision loss, decreased hearing, hoarseness, chest pain, syncope,  peripheral edema, balance deficits, hemoptysis,  melena, hematochezia, severe indigestion/heartburn, hematuria, incontinence, genital sores, muscle weakness, suspicious skin lesions, transient blindness, difficulty walking, depression, unusual weight change, abnormal bleeding, enlarged lymph nodes, angioedema, and breast masses.   Past Medical History:  Diagnosis Date  . Breast cancer ([place]) 2005   Right, radiation and lumpectomy  . Breast cancer ([place]) 2002   Left, Chemo, radiation and lumpectomy  . Cancer ([place])   . Hypertension    Past Surgical History:  Procedure Laterality Date  . BREAST BIOPSY Left 2010   negative stereotactic biopsy  . BREAST LUMPECTOMY Right 2005   positive  . BREAST LUMPECTOMY Left 2002   positive   Social History:  reports that she has never smoked. She has never used smokeless tobacco. She reports that she does not drink alcohol or use drugs.  Allergies  Allergen Reactions  . Ace Inhibitors Other (See Comments)    hyperkalemia  . Atenolol Other (See Comments)    Worsening palpitations  . Iodine Other (See Comments)    11/20/16: per conversation with pt, pt with allergy to topical iodine and betadine.  Pt states she has  had IV contrast in the past with now issues.  . Metoprolol Tartrate Other (See Comments)    intolerant  . Omeprazole Diarrhea  . Povidone-Iodine Other (See Comments)  . Quinolones Other (See Comments)  . Rofecoxib Nausea Only  . Ciprofloxacin Rash  . Doxycycline Calcium Rash  . Nitrofurantoin Rash  . Sulfa Antibiotics Rash    Family History  Problem Relation Age of Onset  . Hypertension Mother   . Hypertension Father     Prior to Admission medications   Medication Sig Start Date End Date Taking? Authorizing Provider  atorvastatin (LIPITOR) 40 MG tablet Take 40 mg by mouth daily.    Yes [provider]  Cholecalciferol (VITAMIN D3) 1000 units CAPS Take 1,000 Units by mouth daily.   Yes [provider]  diltiazem (CARDIZEM CD) 120 MG 24 hr capsule TAKE 1 CAPSULE BY MOUTH EVERY OTHER DAY 10/09/14  Yes [provider]  levothyroxine (SYNTHROID, LEVOTHROID) 50 MCG tablet TAKE 1 TABLET BY MOUTH DAILY ON AN EMPTY STOMACH WITH A GLASS OF WATER 30-60 MIN BEFORE BREAKFAST 11/11/14  Yes [provider]  Multiple Vitamin (MULTI-VITAMINS) TABS Take 1 tablet by mouth daily.   Yes [provider]  omeprazole (PRILOSEC) 20 MG capsule Take 20 mg by mouth 2 (two) times daily. 11/02/16  Yes [provider]  trimethoprim (TRIMPEX) 100 MG tablet Take 1 tablet (100 mg total) by mouth 2 (two) times daily. Patient not taking: Reported on 11/20/2016 01/27/15   Rubie Maid, MD   Physical Exam: Vitals:   11/20/16 1200 11/20/16 1230 11/20/16 1300 11/20/16 1330  BP: (!) 141/82 (!) 141/77 (!) 142/73 140/75  Pulse: 94 99 91 95  Resp: 17 16 15 18   Temp:      TempSrc:      SpO2: 95% 95% 96% 94%  Weight:      Height:         General:  No apparent distress, WDWN, [place]/AT  Eyes: PERRL, EOMI, no scleral icterus , conjunctiva clear  ENT: moist oropharynx without exudate, TM's benign, dentition good  Neck: supple, no lymphadenopathy. No bruits or  thyromegaly  Cardiovascular: regular rate with 2/6 systolic murmur noted. No rubs or gallops; 2+ peripheral pulses, no JVD, no peripheral edema  Respiratory: basilar rhonchi without wheezes or rales. No dullness. Respiratory effort normal  Abdomen: soft, non tender to palpation, positive bowel sounds, no guarding, no rebound. Mild distention with positive fluid wave  Skin: no rashes or lesion  Musculoskeletal: normal bulk and tone, no joint swelling  Psychiatric: normal mood and affect, A&OX3  Neurologic: CN 2-12 grossly intact, Motor strength 5/5 in all 4 groups with symmetric DTR's and non-focal sensory exam  Labs on Admission:  Basic Metabolic Panel:  Recent Labs Lab 11/20/16 0834  NA 137  K 4.2  CL 102  CO2 25  GLUCOSE 117*  BUN 17  CREATININE 1.21*  CALCIUM 8.9   Liver Function Tests:  Recent Labs Lab 11/20/16 0834  AST 36  ALT 18  ALKPHOS 69  BILITOT 1.5*  PROT 7.8  ALBUMIN 3.4*    Recent Labs Lab 11/20/16 0834  LIPASE 24   No results for input(s): AMMONIA in the last 168 hours. CBC:  Recent Labs Lab 11/20/16 0834  WBC 7.6  HGB 13.6  HCT 41.3  MCV 91.8  PLT 365   Cardiac Enzymes:  Recent Labs Lab 11/20/16 0834  TROPONINI 0.03*    BNP (last 3 results) No results for input(s): BNP in the last 8760 hours.  ProBNP (last 3 results) No results for input(s): PROBNP in the last 8760 hours.  CBG: No results for input(s): GLUCAP in the last 168 hours.  Radiological Exams on Admission: Dg Chest 2 View  Result Date: 11/20/2016 CLINICAL DATA:  Increasing shortness of breath over the past month. EXAM: CHEST  2 VIEW COMPARISON:  Chest x-ray dated August 27, 2012. FINDINGS: The cardiomediastinal silhouette is normal in size. Normal pulmonary vascularity. Trace bilateral pleural effusions. No focal consolidation or pneumothorax. No acute osseous abnormality. IMPRESSION: Trace bilateral pleural effusions. Electronically Signed   By: Titus Dubin  M.D.   On: 11/20/2016 09:45   Ct Angio Chest Pe W And/or Wo Contrast  Result Date: 11/20/2016 CLINICAL DATA:  Shortness of breath.  Abdominal distension. EXAM: CT ANGIOGRAPHY CHEST CT ABDOMEN AND PELVIS WITH CONTRAST TECHNIQUE: Multidetector CT imaging of the chest was performed using the standard protocol during bolus administration of intravenous contrast. Multiplanar CT image reconstructions and MIPs were obtained to evaluate the vascular anatomy. Multidetector CT imaging of the abdomen and pelvis was performed using the standard protocol during bolus administration of intravenous contrast. CONTRAST:  75 mL of Isovue 370 intravenously. COMPARISON:  Radiographs of same day.  CT scan of Jun 21, 2013. FINDINGS: CTA CHEST FINDINGS Cardiovascular: Satisfactory opacification of the pulmonary arteries to the segmental level. No evidence of pulmonary embolism. Normal heart size. No pericardial effusion. Atherosclerosis of thoracic aorta is noted without aneurysm formation. Coronary artery calcifications are noted. Mediastinum/Nodes: Moderate size sliding-type hiatal hernia is noted. Wall thickening of distal esophagus is noted concerning for  inflammation. Thyroid gland is unremarkable. No mediastinal adenopathy is noted. Lungs/Pleura: No pneumothorax is noted. Mild bilateral pleural effusions are noted with adjacent subsegmental atelectasis. Musculoskeletal: No chest wall abnormality. No acute or significant osseous findings. Postsurgical changes are seen involving both breasts. Review of the MIP images confirms the above findings. CT ABDOMEN and PELVIS FINDINGS Hepatobiliary: Minimal cholelithiasis is noted. No abnormality is seen in the liver. Pancreas: Stable 3.3 cm cystic lesion seen arising from pancreatic tail. No ductal dilatation or inflammation is noted. Spleen: Normal in size without focal abnormality. Adrenals/Urinary Tract: Adrenal glands appear normal. Stable bilateral renal cysts are noted. No  hydronephrosis or renal obstruction is noted. No renal or ureteral calculi are noted. Urinary bladder is mildly distended, with air-fluid level present superiorly, suggesting recent instrumentation. Stomach/Bowel: Stomach is within normal limits. Appendix appears normal. No evidence of bowel wall thickening, distention, or inflammatory changes. Vascular/Lymphatic: Aortic atherosclerosis. No enlarged abdominal or pelvic lymph nodes. Reproductive: 7.1 x 4.7 cm lobulated and heterogeneously enhancing right adnexal mass is noted concerning for ovarian malignancy. Uterus and left ovary are unremarkable. Other: Moderate ascites is noted.  No definite hernia is noted. Musculoskeletal: No acute or significant osseous findings. Review of the MIP images confirms the above findings. IMPRESSION: No definite evidence of pulmonary embolus. 7.1 x 4.4 cm lobulated right adnexal mass is noted concerning for ovarian malignancy. Moderate ascites is noted. Aortic atherosclerosis. Minimal cholelithiasis. Moderate size sliding-type hiatal hernia is noted. Moderate wall thickening of distal esophagus is noted concerning for inflammation or neoplasm. Endoscopy is recommended for further evaluation. Stable 3.3 cm cystic lesion arising from pancreatic tail. Coronary artery calcifications are noted suggesting coronary artery disease. Electronically Signed   By: Marijo Conception, M.D.   On: 11/20/2016 13:31   Ct Abdomen Pelvis W Contrast  Result Date: 11/20/2016 CLINICAL DATA:  Shortness of breath.  Abdominal distension. EXAM: CT ANGIOGRAPHY CHEST CT ABDOMEN AND PELVIS WITH CONTRAST TECHNIQUE: Multidetector CT imaging of the chest was performed using the standard protocol during bolus administration of intravenous contrast. Multiplanar CT image reconstructions and MIPs were obtained to evaluate the vascular anatomy. Multidetector CT imaging of the abdomen and pelvis was performed using the standard protocol during bolus administration of  intravenous contrast. CONTRAST:  75 mL of Isovue 370 intravenously. COMPARISON:  Radiographs of same day.  CT scan of Jun 21, 2013. FINDINGS: CTA CHEST FINDINGS Cardiovascular: Satisfactory opacification of the pulmonary arteries to the segmental level. No evidence of pulmonary embolism. Normal heart size. No pericardial effusion. Atherosclerosis of thoracic aorta is noted without aneurysm formation. Coronary artery calcifications are noted. Mediastinum/Nodes: Moderate size sliding-type hiatal hernia is noted. Wall thickening of distal esophagus is noted concerning for inflammation. Thyroid gland is unremarkable. No mediastinal adenopathy is noted. Lungs/Pleura: No pneumothorax is noted. Mild bilateral pleural effusions are noted with adjacent subsegmental atelectasis. Musculoskeletal: No chest wall abnormality. No acute or significant osseous findings. Postsurgical changes are seen involving both breasts. Review of the MIP images confirms the above findings. CT ABDOMEN and PELVIS FINDINGS Hepatobiliary: Minimal cholelithiasis is noted. No abnormality is seen in the liver. Pancreas: Stable 3.3 cm cystic lesion seen arising from pancreatic tail. No ductal dilatation or inflammation is noted. Spleen: Normal in size without focal abnormality. Adrenals/Urinary Tract: Adrenal glands appear normal. Stable bilateral renal cysts are noted. No hydronephrosis or renal obstruction is noted. No renal or ureteral calculi are noted. Urinary bladder is mildly distended, with air-fluid level present superiorly, suggesting recent instrumentation. Stomach/Bowel: Stomach is within  normal limits. Appendix appears normal. No evidence of bowel wall thickening, distention, or inflammatory changes. Vascular/Lymphatic: Aortic atherosclerosis. No enlarged abdominal or pelvic lymph nodes. Reproductive: 7.1 x 4.7 cm lobulated and heterogeneously enhancing right adnexal mass is noted concerning for ovarian malignancy. Uterus and left ovary are  unremarkable. Other: Moderate ascites is noted.  No definite hernia is noted. Musculoskeletal: No acute or significant osseous findings. Review of the MIP images confirms the above findings. IMPRESSION: No definite evidence of pulmonary embolus. 7.1 x 4.4 cm lobulated right adnexal mass is noted concerning for ovarian malignancy. Moderate ascites is noted. Aortic atherosclerosis. Minimal cholelithiasis. Moderate size sliding-type hiatal hernia is noted. Moderate wall thickening of distal esophagus is noted concerning for inflammation or neoplasm. Endoscopy is recommended for further evaluation. Stable 3.3 cm cystic lesion arising from pancreatic tail. Coronary artery calcifications are noted suggesting coronary artery disease. Electronically Signed   By: Marijo Conception, M.D.   On: 11/20/2016 13:31    EKG: Independently reviewed.  Assessment/Plan Principal Problem:   Ascites Active Problems:   Frequent UTI   Abdominal pain   Ovarian mass   Will admit to floor with IV fluids and IV ABX. Will send CA 125 and plan for US guided paracentesis. Consult Oncology. Echo for murmur. Repeat labs in AM.  Diet: clear liquids Fluids: NS@75  DVT Prophylaxis: SQ Heparin  Code Status: FULL  Family Communication: yes  Disposition Plan: home  Time spent: 50 min

## 2016-11-21 ENCOUNTER — Inpatient Hospital Stay
Admit: 2016-11-21 | Discharge: 2016-11-21 | Disposition: A | Discharge status: Home / Self Care | Payer: Medicare Other | Attending: Internal Medicine | Admitting: Internal Medicine

## 2016-11-21 LAB — CBC
HEMATOCRIT: 36.3 % (ref 35.0–47.0)
HEMOGLOBIN: 12.3 g/dL (ref 12.0–16.0)
MCH: 31 pg (ref 26.0–34.0)
MCHC: 33.8 g/dL (ref 32.0–36.0)
MCV: 91.7 fL (ref 80.0–100.0)
Platelets: 284 10*3/uL (ref 150–440)
RBC: 3.96 MIL/uL (ref 3.80–5.20)
RDW: 13.8 % (ref 11.5–14.5)
WBC: 7 10*3/uL (ref 3.6–11.0)

## 2016-11-21 LAB — COMPREHENSIVE METABOLIC PANEL
ALK PHOS: 58 U/L (ref 38–126)
ALT: 13 U/L — ABNORMAL LOW (ref 14–54)
ANION GAP: 10 (ref 5–15)
AST: 29 U/L (ref 15–41)
Albumin: 2.8 g/dL — ABNORMAL LOW (ref 3.5–5.0)
BILIRUBIN TOTAL: 1.2 mg/dL (ref 0.3–1.2)
BUN: 17 mg/dL (ref 6–20)
CALCIUM: 8.2 mg/dL — AB (ref 8.9–10.3)
CO2: 25 mmol/L (ref 22–32)
Chloride: 102 mmol/L (ref 101–111)
Creatinine, Ser: 1.07 mg/dL — ABNORMAL HIGH (ref 0.44–1.00)
GFR calc non Af Amer: 47 mL/min — ABNORMAL LOW (ref >60)
GFR, EST AFRICAN AMERICAN: 55 mL/min — AB (ref >60)
Glucose, Bld: 105 mg/dL — ABNORMAL HIGH (ref 65–99)
POTASSIUM: 4.1 mmol/L (ref 3.5–5.1)
SODIUM: 137 mmol/L (ref 135–145)
TOTAL PROTEIN: 6.5 g/dL (ref 6.5–8.1)

## 2016-11-21 LAB — ECHOCARDIOGRAM COMPLETE
HEIGHTINCHES: 65 in
WEIGHTICAEL: 2844.8 [oz_av]

## 2016-11-21 LAB — GLUCOSE, CAPILLARY: Glucose-Capillary: 97 mg/dL (ref 65–99)

## 2016-11-21 MED ORDER — DEXTROSE 5 % IV SOLN
INTRAVENOUS | Status: DC
  Administered 2016-11-21: 17:00:00 via INTRAVENOUS
  Filled 2016-11-21 (×2): qty 10

## 2016-11-21 MED ORDER — IPRATROPIUM-ALBUTEROL 0.5-2.5 (3) MG/3ML IN SOLN
3.0000 mL | Freq: Four times a day (QID) | RESPIRATORY_TRACT | Status: DC | PRN
Start: 2016-11-21 — End: 2016-11-22

## 2016-11-21 MED ORDER — PROMETHAZINE HCL 25 MG/ML IJ SOLN
12.5000 mg | Freq: Four times a day (QID) | INTRAMUSCULAR | Status: DC | PRN
  Administered 2016-11-21: 13:00:00 12.5 mg via INTRAVENOUS
  Filled 2016-11-21: qty 1

## 2016-11-21 NOTE — Progress Notes (Signed)
Patient ID: Ebony Navarro, female   DOB: [birth year], 83 y.o.   MRN: [id number]  [facility] PROGRESS NOTE  Ebony Navarro JKD:[id number] DOB: 25-Jun-1933 DOA: 11/20/2016 PCP: Adin Hector, MD  HPI/Subjective: Patient with nausea and vomiting. She has abdominal distention. Not feeling well.  Objective: Vitals:   11/21/16 0420 11/21/16 1457  BP: (!) 147/73 133/62  Pulse: 99 96  Resp: 19 18  Temp: 98.8 F (37.1 C) 98.7 F (37.1 C)  SpO2: 92% 94%    Filed Weights   11/20/16 0828 11/21/16 0027 11/21/16 0500  Weight: 74.8 kg (165 lb) 80.6 kg (177 lb 12.8 oz) 80.6 kg (177 lb 12.8 oz)    ROS: Review of Systems  Constitutional: Negative for chills and fever.  Eyes: Negative for blurred vision.  Respiratory: Positive for shortness of breath. Negative for cough.   Cardiovascular: Negative for chest pain.  Gastrointestinal: Positive for abdominal pain, nausea and vomiting. Negative for constipation and diarrhea.  Genitourinary: Negative for dysuria.  Musculoskeletal: Negative for joint pain.  Neurological: Negative for dizziness and headaches.   Exam: Physical Exam  Constitutional: She is oriented to person, place, and time.  HENT:  Nose: No mucosal edema.  Mouth/Throat: No oropharyngeal exudate or posterior oropharyngeal edema.  Eyes: Pupils are equal, round, and reactive to light. Conjunctivae, EOM and lids are normal.  Neck: No JVD present. Carotid bruit is not present. No edema present. No thyroid mass and no thyromegaly present.  Cardiovascular: S1 normal and S2 normal.  Exam reveals no gallop.   No murmur heard. Pulses:      Dorsalis pedis pulses are 2+ on the right side, and 2+ on the left side.  Respiratory: No respiratory distress. She has no wheezes. She has no rhonchi. She has no rales.  GI: Soft. Bowel sounds are normal. She exhibits distension. There is tenderness.  Musculoskeletal:       Right ankle: She exhibits no swelling.       Left ankle:  She exhibits no swelling.  Lymphadenopathy:    She has no cervical adenopathy.  Neurological: She is alert and oriented to person, place, and time. No cranial nerve deficit.  Skin: Skin is warm. No rash noted. Nails show no clubbing.  Psychiatric: She has a normal mood and affect.      Data Reviewed: Basic Metabolic Panel:  Recent Labs Lab 11/20/16 0834 11/21/16 0454  NA 137 137  K 4.2 4.1  CL 102 102  CO2 25 25  GLUCOSE 117* 105*  BUN 17 17  CREATININE 1.21* 1.07*  CALCIUM 8.9 8.2*   Liver Function Tests:  Recent Labs Lab 11/20/16 0834 11/21/16 0454  AST 36 29  ALT 18 13*  ALKPHOS 69 58  BILITOT 1.5* 1.2  PROT 7.8 6.5  ALBUMIN 3.4* 2.8*    Recent Labs Lab 11/20/16 0834  LIPASE 24   CBC:  Recent Labs Lab 11/20/16 0834 11/21/16 0454  WBC 7.6 7.0  HGB 13.6 12.3  HCT 41.3 36.3  MCV 91.8 91.7  PLT 365 284   Cardiac Enzymes:  Recent Labs Lab 11/20/16 0834  TROPONINI 0.03*    CBG:  Recent Labs Lab 11/21/16 0731  GLUCAP 97      Studies: Dg Chest 2 View  Result Date: 11/20/2016 CLINICAL DATA:  Increasing shortness of breath over the past month. EXAM: CHEST  2 VIEW COMPARISON:  Chest x-ray dated August 27, 2012. FINDINGS: The cardiomediastinal silhouette is normal in size. Normal pulmonary vascularity. Trace  bilateral pleural effusions. No focal consolidation or pneumothorax. No acute osseous abnormality. IMPRESSION: Trace bilateral pleural effusions. Electronically Signed   By: Titus Dubin M.D.   On: 11/20/2016 09:45   Ct Angio Chest Pe W And/or Wo Contrast  Result Date: 11/20/2016 CLINICAL DATA:  Shortness of breath.  Abdominal distension. EXAM: CT ANGIOGRAPHY CHEST CT ABDOMEN AND PELVIS WITH CONTRAST TECHNIQUE: Multidetector CT imaging of the chest was performed using the standard protocol during bolus administration of intravenous contrast. Multiplanar CT image reconstructions and MIPs were obtained to evaluate the vascular anatomy.  Multidetector CT imaging of the abdomen and pelvis was performed using the standard protocol during bolus administration of intravenous contrast. CONTRAST:  75 mL of Isovue 370 intravenously. COMPARISON:  Radiographs of same day.  CT scan of Jun 21, 2013. FINDINGS: CTA CHEST FINDINGS Cardiovascular: Satisfactory opacification of the pulmonary arteries to the segmental level. No evidence of pulmonary embolism. Normal heart size. No pericardial effusion. Atherosclerosis of thoracic aorta is noted without aneurysm formation. Coronary artery calcifications are noted. Mediastinum/Nodes: Moderate size sliding-type hiatal hernia is noted. Wall thickening of distal esophagus is noted concerning for inflammation. Thyroid gland is unremarkable. No mediastinal adenopathy is noted. Lungs/Pleura: No pneumothorax is noted. Mild bilateral pleural effusions are noted with adjacent subsegmental atelectasis. Musculoskeletal: No chest wall abnormality. No acute or significant osseous findings. Postsurgical changes are seen involving both breasts. Review of the MIP images confirms the above findings. CT ABDOMEN and PELVIS FINDINGS Hepatobiliary: Minimal cholelithiasis is noted. No abnormality is seen in the liver. Pancreas: Stable 3.3 cm cystic lesion seen arising from pancreatic tail. No ductal dilatation or inflammation is noted. Spleen: Normal in size without focal abnormality. Adrenals/Urinary Tract: Adrenal glands appear normal. Stable bilateral renal cysts are noted. No hydronephrosis or renal obstruction is noted. No renal or ureteral calculi are noted. Urinary bladder is mildly distended, with air-fluid level present superiorly, suggesting recent instrumentation. Stomach/Bowel: Stomach is within normal limits. Appendix appears normal. No evidence of bowel wall thickening, distention, or inflammatory changes. Vascular/Lymphatic: Aortic atherosclerosis. No enlarged abdominal or pelvic lymph nodes. Reproductive: 7.1 x 4.7 cm  lobulated and heterogeneously enhancing right adnexal mass is noted concerning for ovarian malignancy. Uterus and left ovary are unremarkable. Other: Moderate ascites is noted.  No definite hernia is noted. Musculoskeletal: No acute or significant osseous findings. Review of the MIP images confirms the above findings. IMPRESSION: No definite evidence of pulmonary embolus. 7.1 x 4.4 cm lobulated right adnexal mass is noted concerning for ovarian malignancy. Moderate ascites is noted. Aortic atherosclerosis. Minimal cholelithiasis. Moderate size sliding-type hiatal hernia is noted. Moderate wall thickening of distal esophagus is noted concerning for inflammation or neoplasm. Endoscopy is recommended for further evaluation. Stable 3.3 cm cystic lesion arising from pancreatic tail. Coronary artery calcifications are noted suggesting coronary artery disease. Electronically Signed   By: Marijo Conception, M.D.   On: 11/20/2016 13:31   Ct Abdomen Pelvis W Contrast  Result Date: 11/20/2016 CLINICAL DATA:  Shortness of breath.  Abdominal distension. EXAM: CT ANGIOGRAPHY CHEST CT ABDOMEN AND PELVIS WITH CONTRAST TECHNIQUE: Multidetector CT imaging of the chest was performed using the standard protocol during bolus administration of intravenous contrast. Multiplanar CT image reconstructions and MIPs were obtained to evaluate the vascular anatomy. Multidetector CT imaging of the abdomen and pelvis was performed using the standard protocol during bolus administration of intravenous contrast. CONTRAST:  75 mL of Isovue 370 intravenously. COMPARISON:  Radiographs of same day.  CT scan of Jun 21, 2013.  FINDINGS: CTA CHEST FINDINGS Cardiovascular: Satisfactory opacification of the pulmonary arteries to the segmental level. No evidence of pulmonary embolism. Normal heart size. No pericardial effusion. Atherosclerosis of thoracic aorta is noted without aneurysm formation. Coronary artery calcifications are noted. Mediastinum/Nodes:  Moderate size sliding-type hiatal hernia is noted. Wall thickening of distal esophagus is noted concerning for inflammation. Thyroid gland is unremarkable. No mediastinal adenopathy is noted. Lungs/Pleura: No pneumothorax is noted. Mild bilateral pleural effusions are noted with adjacent subsegmental atelectasis. Musculoskeletal: No chest wall abnormality. No acute or significant osseous findings. Postsurgical changes are seen involving both breasts. Review of the MIP images confirms the above findings. CT ABDOMEN and PELVIS FINDINGS Hepatobiliary: Minimal cholelithiasis is noted. No abnormality is seen in the liver. Pancreas: Stable 3.3 cm cystic lesion seen arising from pancreatic tail. No ductal dilatation or inflammation is noted. Spleen: Normal in size without focal abnormality. Adrenals/Urinary Tract: Adrenal glands appear normal. Stable bilateral renal cysts are noted. No hydronephrosis or renal obstruction is noted. No renal or ureteral calculi are noted. Urinary bladder is mildly distended, with air-fluid level present superiorly, suggesting recent instrumentation. Stomach/Bowel: Stomach is within normal limits. Appendix appears normal. No evidence of bowel wall thickening, distention, or inflammatory changes. Vascular/Lymphatic: Aortic atherosclerosis. No enlarged abdominal or pelvic lymph nodes. Reproductive: 7.1 x 4.7 cm lobulated and heterogeneously enhancing right adnexal mass is noted concerning for ovarian malignancy. Uterus and left ovary are unremarkable. Other: Moderate ascites is noted.  No definite hernia is noted. Musculoskeletal: No acute or significant osseous findings. Review of the MIP images confirms the above findings. IMPRESSION: No definite evidence of pulmonary embolus. 7.1 x 4.4 cm lobulated right adnexal mass is noted concerning for ovarian malignancy. Moderate ascites is noted. Aortic atherosclerosis. Minimal cholelithiasis. Moderate size sliding-type hiatal hernia is noted.  Moderate wall thickening of distal esophagus is noted concerning for inflammation or neoplasm. Endoscopy is recommended for further evaluation. Stable 3.3 cm cystic lesion arising from pancreatic tail. Coronary artery calcifications are noted suggesting coronary artery disease. Electronically Signed   By: Marijo Conception, M.D.   On: 11/20/2016 13:31    Scheduled Meds: . atorvastatin  40 mg Oral Daily  . diltiazem  180 mg Oral Daily  . docusate sodium  100 mg Oral BID  . heparin  5,000 Units Subcutaneous Q8H  . levothyroxine  50 mcg Oral QAC breakfast   Continuous Infusions: . sodium chloride 30 mL/hr at 11/21/16 1504  . small volume/piggyback Museum/gallery curator    . famotidine (PEPCID) IV      Assessment/Plan:  1. Ovarian mass with ascites. Patient also with nausea and vomiting. Gentle IV fluid hydration. When necessary nausea medications. IV Pepcid. Ultrasound-guided paracentesis for cytology tomorrow. Unfortunately we do not have a gynecologic person here. The plan would be hopefully cytology will be revealing and treatment options can be done. Then follow-up as outpatient with gynecology oncology. 2. Essential hypertension on diltiazem 3. Hyperlipidemia unspecified on atorvastatin 4. Hypothyroidism unspecified on levothyroxine 5. History of breast cancer 6. Moderate hiatal hernia and thickening of the distal esophagus. Supportive care for right now with IV Pepcid.  Potential GI consultation for endoscopy if still vomiting. 7. Moderate aortic stenosis on echocardiogram  Code Status:     Code Status Orders        Start     Ordered   11/20/16 1640  Full code  Continuous     11/20/16 1639    Code Status History    Date Active Date Inactive Code Status  Order ID Comments User Context   This patient has a current code status but no historical code status.    Advance Directive Documentation     Most Recent Value  Type of Advance Directive  Living will  Pre-existing out of facility DNR  order (yellow form or pink MOST form)  -  "MOST" Form in Place?  -     Family Communication: family the bedside Disposition Plan: to be determined based on clinical course  Consultants:  oncology  Time spent: 69 minutes  Ebony Navarro  [employer]

## 2016-11-21 NOTE — Clinical Social Work Note (Signed)
CSW received a consult that the patient is requesting nursing home placement. The patient will need a recommendation from PT prior to insurance covering such. Please place a consult for PT to evaluate this patient. CSW will follow pending recommendations.  Santiago Bumpers, MSW, Latanya Presser [phone]

## 2016-11-22 ENCOUNTER — Other Ambulatory Visit: Payer: Self-pay

## 2016-11-22 ENCOUNTER — Telehealth: Payer: Self-pay

## 2016-11-22 ENCOUNTER — Inpatient Hospital Stay: Payer: Medicare Other

## 2016-11-22 ENCOUNTER — Other Ambulatory Visit: Payer: Self-pay | Admitting: *Deleted

## 2016-11-22 DIAGNOSIS — N838 Other noninflammatory disorders of ovary, fallopian tube and broad ligament: Secondary | ICD-10-CM

## 2016-11-22 DIAGNOSIS — R188 Other ascites: Secondary | ICD-10-CM

## 2016-11-22 LAB — CBC
HCT: 35 % (ref 35.0–47.0)
Hemoglobin: 11.6 g/dL — ABNORMAL LOW (ref 12.0–16.0)
MCH: 30.2 pg (ref 26.0–34.0)
MCHC: 33.1 g/dL (ref 32.0–36.0)
MCV: 91.4 fL (ref 80.0–100.0)
PLATELETS: 271 10*3/uL (ref 150–440)
RBC: 3.83 MIL/uL (ref 3.80–5.20)
RDW: 13.8 % (ref 11.5–14.5)
WBC: 6.1 10*3/uL (ref 3.6–11.0)

## 2016-11-22 LAB — BASIC METABOLIC PANEL
Anion gap: 8 (ref 5–15)
BUN: 16 mg/dL (ref 6–20)
CO2: 25 mmol/L (ref 22–32)
CREATININE: 0.87 mg/dL (ref 0.44–1.00)
Calcium: 8.1 mg/dL — ABNORMAL LOW (ref 8.9–10.3)
Chloride: 106 mmol/L (ref 101–111)
GFR calc Af Amer: >60 mL/min (ref >60)
Glucose, Bld: 113 mg/dL — ABNORMAL HIGH (ref 65–99)
Potassium: 3.9 mmol/L (ref 3.5–5.1)
SODIUM: 139 mmol/L (ref 135–145)

## 2016-11-22 LAB — BODY FLUID CELL COUNT WITH DIFFERENTIAL
Eos, Fluid: 0 %
LYMPHS FL: 86 %
Monocyte-Macrophage-Serous Fluid: 5 %
Neutrophil Count, Fluid: 9 %
Total Nucleated Cell Count, Fluid: 389 cu mm

## 2016-11-22 LAB — ALBUMIN, PLEURAL OR PERITONEAL FLUID: Albumin, Fluid: 2.2 g/dL

## 2016-11-22 LAB — PROTEIN, PLEURAL OR PERITONEAL FLUID: TOTAL PROTEIN, FLUID: 4.4 g/dL

## 2016-11-22 LAB — CA 125: CANCER ANTIGEN (CA) 125: 2658 U/mL — AB (ref 0.0–38.1)

## 2016-11-22 LAB — GLUCOSE, PLEURAL OR PERITONEAL FLUID: Glucose, Fluid: 89 mg/dL

## 2016-11-22 LAB — GLUCOSE, CAPILLARY: Glucose-Capillary: 105 mg/dL — ABNORMAL HIGH (ref 65–99)

## 2016-11-22 MED ORDER — FAMOTIDINE 20 MG PO TABS: 20.0000 mg | ORAL_TABLET | Freq: Two times a day (BID) | ORAL | Status: DC

## 2016-11-22 MED ORDER — PROMETHAZINE HCL 25 MG PO TABS: 25.0000 mg | ORAL_TABLET | Freq: Four times a day (QID) | ORAL | 0 refills | Status: DC | PRN

## 2016-11-22 MED ORDER — AMOXICILLIN-POT CLAVULANATE 875-125 MG PO TABS
1.0000 | ORAL_TABLET | Freq: Two times a day (BID) | ORAL | Status: DC
  Administered 2016-11-22: 1 via ORAL
  Filled 2016-11-22: qty 1

## 2016-11-22 MED ORDER — PROMETHAZINE HCL 25 MG PO TABS
25.0000 mg | ORAL_TABLET | Freq: Four times a day (QID) | ORAL | Status: DC | PRN
  Filled 2016-11-22: qty 1

## 2016-11-22 MED ORDER — AMOXICILLIN-POT CLAVULANATE 875-125 MG PO TABS: 1.0000 | ORAL_TABLET | Freq: Two times a day (BID) | ORAL | 0 refills | Status: DC

## 2016-11-22 MED ORDER — FAMOTIDINE 20 MG PO TABS: 20.0000 mg | ORAL_TABLET | Freq: Two times a day (BID) | ORAL | 0 refills | Status: DC

## 2016-11-22 NOTE — Discharge Summary (Signed)
[facility] [place] NAME: Ebony Navarro    MR#:  [id number]  DATE OF BIRTH:  11/19/1933  DATE OF ADMISSION:  11/20/2016 ADMITTING PHYSICIAN: Idelle Crouch, MD  DATE OF DISCHARGE: 11/22/2016  PRIMARY CARE PHYSICIAN: Adin Hector, MD    ADMISSION DIAGNOSIS:  Shortness of breath [R06.02] Ovarian mass [N83.9] Malignant ascites [R18.0] Ascites [R18.8]  DISCHARGE DIAGNOSIS:  Principal Problem:   Ascites Active Problems:   Frequent UTI   Abdominal pain   Ovarian mass   SECONDARY DIAGNOSIS:   Past Medical History:  Diagnosis Date  . Breast cancer ([place]) 2005   Right, radiation and lumpectomy  . Breast cancer ([place]) 2002   Left, Chemo, radiation and lumpectomy  . Cancer ([place])   . Hypertension     HOSPITAL COURSE:   1.  Ovarian mass with ascites. Patient also with nausea vomiting. We did give gentle IV fluid during the hospital course. As necessary nausea medications. We did give IV Pepcid. Ultrasound-guided paracentesis removed 5 L of fluid.  Cytology pending at the time of discharge.  Scheduled a follow-up appointment with gynecology oncology as outpatient. CA 125 elevated at 2658.  Patient seen in consultation by oncology Dr Tasia Catchings.  Augmentin prescribed for elevation in white blood cell count with paracentesis and also for positive urine analysis. The patient was given Rocephin while here in the hospital. 2. Essential hypertension on diltiazem 3. Hyperlipidemia unspecified on atorvastatin 4. Hypothyroidism unspecified on 5. History of breast cancer 6. Moderate hiatal hernia and thickening of the distal esophagus. The patient had diarrhea with PPI. We gave IV Pepcid while here. Since the patient is tolerating diet and not vomiting at this point, we will hold off on endoscopy as inpatient. 7. Moderate aortic stenosis on echocardiogram  DISCHARGE CONDITIONS:   satisfactory  CONSULTS OBTAINED:  Treatment Team:  Twana First, MD  DRUG ALLERGIES:   Allergies  Allergen Reactions  . Ace Inhibitors Other (See Comments)    hyperkalemia  . Atenolol Other (See Comments)    Worsening palpitations  . Iodine Other (See Comments)    11/20/16: per conversation with pt, pt with allergy to topical iodine and betadine.  Pt states she has had IV contrast in the past with now issues.  . Metoprolol Tartrate Other (See Comments)    intolerant  . Omeprazole Diarrhea  . Povidone-Iodine Other (See Comments)  . Quinolones Other (See Comments)  . Rofecoxib Nausea Only  . Ciprofloxacin Rash  . Doxycycline Calcium Rash  . Nitrofurantoin Rash  . Sulfa Antibiotics Rash    DISCHARGE MEDICATIONS:   Current Discharge Medication List    START taking these medications   Details  amoxicillin-clavulanate (AUGMENTIN) 875-125 MG tablet Take 1 tablet by mouth every 12 (twelve) hours. Qty: 12 tablet, Refills: 0    famotidine (PEPCID) 20 MG tablet Take 1 tablet (20 mg total) by mouth 2 (two) times daily. Qty: 60 tablet, Refills: 0    promethazine (PHENERGAN) 25 MG tablet Take 1 tablet (25 mg total) by mouth every 6 (six) hours as needed for nausea or vomiting. Qty: 20 tablet, Refills: 0      CONTINUE these medications which have NOT CHANGED   Details  atorvastatin (LIPITOR) 40 MG tablet Take 40 mg by mouth daily.     Cholecalciferol (VITAMIN D3) 1000 units CAPS Take 1,000 Units by mouth daily.    diltiazem (CARDIZEM CD) 120 MG 24 hr capsule TAKE 1 CAPSULE BY MOUTH  EVERY OTHER DAY    levothyroxine (SYNTHROID, LEVOTHROID) 50 MCG tablet TAKE 1 TABLET BY MOUTH DAILY ON AN EMPTY STOMACH WITH A GLASS OF WATER 30-60 MIN BEFORE BREAKFAST    Multiple Vitamin (MULTI-VITAMINS) TABS Take 1 tablet by mouth daily.      STOP taking these medications     omeprazole (PRILOSEC) 20 MG capsule      trimethoprim (TRIMPEX) 100 MG tablet          DISCHARGE INSTRUCTIONS:   Follow-up PMD one week Follow-up oncology and  gynecology oncology as outpatient  If you experience worsening of your admission symptoms, develop shortness of breath, life threatening emergency, suicidal or homicidal thoughts you must seek medical attention immediately by calling 911 or calling your MD immediately  if symptoms less severe.  You Must read complete instructions/literature along with all the possible adverse reactions/side effects for all the Medicines you take and that have been prescribed to you. Take any new Medicines after you have completely understood and accept all the possible adverse reactions/side effects.   Please note  You were cared for by a hospitalist during your hospital stay. If you have any questions about your discharge medications or the care you received while you were in the hospital after you are discharged, you can call the unit and asked to speak with the hospitalist on call if the hospitalist that took care of you is not available. Once you are discharged, your primary care physician will handle any further medical issues. Please note that NO REFILLS for any discharge medications will be authorized once you are discharged, as it is imperative that you return to your primary care physician (or establish a relationship with a primary care physician if you do not have one) for your aftercare needs so that they can reassess your need for medications and monitor your lab values.    Today   CHIEF COMPLAINT:   Chief Complaint  Patient presents with  . Shortness of Breath    HISTORY OF PRESENT ILLNESS:  Ebony Navarro  is a 83 y.o. female presented with abdominal distention and shortness of breath and nausea vomiting   VITAL SIGNS:  Blood pressure (!) 146/68, pulse 86, temperature 98.3 F (36.8 C), temperature source Oral, resp. rate 18, height 5\' 5"  (1.651 m), weight 80 kg (176 lb 7 oz), SpO2 92 %.   PHYSICAL EXAMINATION:  GENERAL:  83 y.o.-year-old patient lying in the bed with no acute  distress.  EYES: Pupils equal, round, reactive to light and accommodation. No scleral icterus. Extraocular muscles intact.  HEENT: Head atraumatic, normocephalic. Oropharynx and nasopharynx clear.  NECK:  Supple, no jugular venous distention. No thyroid enlargement, no tenderness.  LUNGS: Normal breath sounds bilaterally, no wheezing, rales,rhonchi or crepitation. No use of accessory muscles of respiration.  CARDIOVASCULAR: S1, S2 normal. No murmurs, rubs, or gallops.  ABDOMEN: Soft, non-tender, distended. Bowel sounds present. No organomegaly or mass.  EXTREMITIES: No pedal edema, cyanosis, or clubbing.  NEUROLOGIC: Cranial nerves II through XII are intact. Muscle strength 5/5 in all extremities. Sensation intact. Gait not checked.  PSYCHIATRIC: The patient is alert and oriented x 3.  SKIN: No obvious rash, lesion, or ulcer.   DATA REVIEW:   CBC  Recent Labs Lab 11/22/16 0418  WBC 6.1  HGB 11.6*  HCT 35.0  PLT 271    Chemistries   Recent Labs Lab 11/21/16 0454 11/22/16 0418  NA 137 139  K 4.1 3.9  CL 102 106  CO2  25 25  GLUCOSE 105* 113*  BUN 17 16  CREATININE 1.07* 0.87  CALCIUM 8.2* 8.1*  AST 29  --   ALT 13*  --   ALKPHOS 58  --   BILITOT 1.2  --     Cardiac Enzymes  Recent Labs Lab 11/20/16 0834  TROPONINI 0.03*     RADIOLOGY:  US Paracentesis  Result Date: 11/22/2016 INDICATION: Ascites EXAM: ULTRASOUND GUIDED PARACENTESIS MEDICATIONS: None. COMPLICATIONS: None immediate. PROCEDURE: Informed written consent was obtained from the patient after a discussion of the risks, benefits and alternatives to treatment. A timeout was performed prior to the initiation of the procedure. Initial ultrasound scanning demonstrates a large amount of ascites within the right lower abdominal quadrant. The right lower abdomen was prepped and draped in the usual sterile fashion. 1% lidocaine was used for local anesthesia. Following this, a 6 Fr Safe-T-Centesis catheter was  introduced. An ultrasound image was saved for documentation purposes. The paracentesis was performed. The catheter was removed and a dressing was applied. The patient tolerated the procedure well without immediate post procedural complication. FINDINGS: A total of approximately 4.7 L of yellow fluid was removed. Samples were sent to the laboratory as requested by the clinical team. IMPRESSION: Successful ultrasound-guided paracentesis yielding 4.7 liters of peritoneal fluid. Electronically Signed   By: Inez Catalina M.D.   On: 11/22/2016 12:15     Management plans discussed with the patient, family and they are in agreement.  CODE STATUS:     Code Status Orders        Start     Ordered   11/20/16 1640  Full code  Continuous     11/20/16 1639    Code Status History    Date Active Date Inactive Code Status Order ID Comments User Context   This patient has a current code status but no historical code status.    Advance Directive Documentation     Most Recent Value  Type of Advance Directive  Living will  Pre-existing out of facility DNR order (yellow form or pink MOST form)  -  "MOST" Form in Place?  -      TOTAL TIME TAKING CARE OF THIS PATIENT: 35 minutes.    Loletha Grayer M.D on 11/22/2016 at 2:57 PM  Between 7am to 6pm - Pager - [phone]  After 6pm go to [email or web address] - password [email or web address] [facility]  [phone]  CC: Primary care physician; Adin Hector, MD

## 2016-11-22 NOTE — Procedures (Signed)
US paracentesis without difficulty  Complications:  None  Blood Loss: none  See dictation in canopy pacs  

## 2016-11-22 NOTE — Progress Notes (Signed)
Discharge paperwork reviewed with patient and family who verbalize understanding of new medications and follow up appointments. Patient is stable and ready for discharge. Paracentesis site clean/dry/intact. Pt feels "like a new person". Pt son to transport home.

## 2016-11-22 NOTE — Progress Notes (Signed)
Hematology/Oncology Progress Note [facility] [phone] [phone]  Patient Care Team: Adin Hector, MD as PCP - General (Internal Medicine)   Name of the patient: Ebony Navarro  [id number]  Feb 01, 1934  Date of visit: 11/22/16   INTERVAL HISTORY- patient was seen and examined the bedside. She is status post ultrasound guided paracentesis for post therapeutic and diagnostic purposes, she got 5 L fluid removed. Patient subjectively feel significant improvement of abdomen fullness, dyspepsia and belching. She is sitting in a chair with her daughter at the bedside. CEA 125 is 2658.  Patient denies abdominal pain, fever or chills, urinary symptoms.Review of Systems    Review of systems- ROS Constitutional: Negative for fever, night sweats,unintentional weight loss,  HENT: Negative for ear pain, hearing loss, nasal bleeding Eyes: Negative for eye pain, double vision   Respiratory: Negative for wheezing, shortness of breath, cough Cardiovascular: Negative for chest pain, palpitation.   Gastrointestinal: Negative abdominal pain, diarrhea, nausea vomiting Endocrine: Negative  Genitourinary: Negative for dysuria, hematuria, frequency Skin: Negative for rash, iching, bruising Neurological: Negative for headache, dizziness, seizure Hematological: Negative for easy bruising/bleeding, lymph node enlargement Psychiatric/Behavioral: Negative for depression, anxiety, suicidality Allergies  Allergen Reactions  . Ace Inhibitors Other (See Comments)    hyperkalemia  . Atenolol Other (See Comments)    Worsening palpitations  . Iodine Other (See Comments)    11/20/16: per conversation with pt, pt with allergy to topical iodine and betadine.  Pt states she has had IV contrast in the past with now issues.  . Metoprolol Tartrate Other (See Comments)    intolerant  . Omeprazole Diarrhea  . Povidone-Iodine Other (See Comments)  . Quinolones Other  (See Comments)  . Rofecoxib Nausea Only  . Ciprofloxacin Rash  . Doxycycline Calcium Rash  . Nitrofurantoin Rash  . Sulfa Antibiotics Rash    Patient Active Problem List   Diagnosis Date Noted  . Abdominal pain 11/20/2016  . Ascites 11/20/2016  . Ovarian mass 11/20/2016  . Absolute anemia 01/22/2015  . Anxiety 01/22/2015  . Malignant neoplasm of breast ([place]) 01/22/2015  . Clinical depression 01/22/2015  . Gastric catarrh 01/22/2015  . Blood glucose elevated 01/22/2015  . HLD (hyperlipidemia) 01/22/2015  . BP (high blood pressure) 01/22/2015  . Acquired atrophy of thyroid 04/02/2014  . Frequent UTI 11/23/2013  . Chronic kidney disease (CKD), stage III (moderate) ([place]) 10/02/2013     Past Medical History:  Diagnosis Date  . Breast cancer ([place]) 2005   Right, radiation and lumpectomy  . Breast cancer ([place]) 2002   Left, Chemo, radiation and lumpectomy  . Cancer ([place])   . Hypertension      Past Surgical History:  Procedure Laterality Date  . BREAST BIOPSY Left 2010   negative stereotactic biopsy  . BREAST LUMPECTOMY Right 2005   positive  . BREAST LUMPECTOMY Left 2002   positive    Social History   Social History  . Marital status: Widowed    Spouse name: N/A  . Number of children: N/A  . Years of education: N/A   Occupational History  . Not on file.   Social History Main Topics  . Smoking status: Never Smoker  . Smokeless tobacco: Never Used  . Alcohol use No  . Drug use: No  . Sexual activity: No   Other Topics Concern  . Not on file   Social History Narrative  . No narrative on file     Family History  Problem Relation  Age of Onset  . Hypertension Mother   . Hypertension Father      Current Facility-Administered Medications:  .  acetaminophen (TYLENOL) tablet 650 mg, 650 mg, Oral, Q6H PRN **OR** acetaminophen (TYLENOL) suppository 650 mg, 650 mg, Rectal, Q6H PRN, Idelle Crouch, MD .  amoxicillin-clavulanate (AUGMENTIN) 875-125 MG per  tablet 1 tablet, 1 tablet, Oral, Q12H, Wieting, Richard, MD .  atorvastatin (LIPITOR) tablet 40 mg, 40 mg, Oral, Daily, Idelle Crouch, MD, 40 mg at 11/21/16 1727 .  bisacodyl (DULCOLAX) suppository 10 mg, 10 mg, Rectal, Daily PRN, Idelle Crouch, MD .  cefTRIAXone (ROCEPHIN) 1 g in dextrose 5 % 50 mL injection, , Intravenous, Q24H, Loletha Grayer, MD, Stopped at 11/21/16 1757 .  diltiazem (CARDIZEM CD) 24 hr capsule 180 mg, 180 mg, Oral, Daily, Idelle Crouch, MD, 180 mg at 11/22/16 1000 .  docusate sodium (COLACE) capsule 100 mg, 100 mg, Oral, BID, Idelle Crouch, MD, 100 mg at 11/22/16 1000 .  famotidine (PEPCID) tablet 20 mg, 20 mg, Oral, BID, Wieting, Richard, MD .  heparin injection 5,000 Units, 5,000 Units, Subcutaneous, Q8H, Sparks, Leonie Douglas, MD, Stopped at 11/22/16 0600 .  ipratropium-albuterol (DUONEB) 0.5-2.5 (3) MG/3ML nebulizer solution 3 mL, 3 mL, Nebulization, Q6H PRN, Wieting, Richard, MD .  levothyroxine (SYNTHROID, LEVOTHROID) tablet 50 mcg, 50 mcg, Oral, QAC breakfast, Idelle Crouch, MD, 50 mcg at 11/21/16 0740 .  morphine 2 MG/ML injection 2 mg, 2 mg, Intravenous, Q2H PRN, Idelle Crouch, MD .  ondansetron (ZOFRAN) tablet 4 mg, 4 mg, Oral, Q6H PRN **OR** ondansetron (ZOFRAN) injection 4 mg, 4 mg, Intravenous, Q6H PRN, Idelle Crouch, MD, 4 mg at 11/21/16 0931 .  promethazine (PHENERGAN) injection 12.5 mg, 12.5 mg, Intravenous, Q6H PRN, Loletha Grayer, MD, 12.5 mg at 11/21/16 1243 .  promethazine (PHENERGAN) tablet 25 mg, 25 mg, Oral, Q6H PRN, Leslye Peer, Richard, MD .  simethicone (MYLICON) chewable tablet 160 mg, 160 mg, Oral, QID PRN, Idelle Crouch, MD   Physical exam:  Vitals:   11/22/16 1029 11/22/16 1044 11/22/16 1056 11/22/16 1221  BP: 133/77 135/70 (!) 145/69 (!) 146/68  Pulse: 87 86 84 86  Resp: 20 20 20 18   Temp:    98.3 F (36.8 C)  TempSrc:    Oral  SpO2: 90% 91% 91% 92%  Weight:      Height:       GENERAL:Alert, no distress and  comfortable.  EYES: no pallor or icterus OROPHARYNX: no thrush or ulceration; good dentition  NECK: supple, no masses felt UNGS: clear to auscultation and  No wheeze or crackles HEART/CVS: regular rate & rhythm and no murmurs; No lower extremity edema ABDOMEN: abdomen distention improved. Musculoskeletal:no cyanosis of digits and no clubbing  PSYCH: alert & oriented x 3  NEURO: no focal motor/sensory deficits SKIN:  no rashes or significant lesions    CMP Latest Ref Rng & Units 11/22/2016  Glucose 65 - 99 mg/dL 113(H)  BUN 6 - 20 mg/dL 16  Creatinine 0.44 - 1.00 mg/dL 0.87  Sodium 135 - 145 mmol/L 139  Potassium 3.5 - 5.1 mmol/L 3.9  Chloride 101 - 111 mmol/L 106  CO2 22 - 32 mmol/L 25  Calcium 8.9 - 10.3 mg/dL 8.1(L)  Total Protein 6.5 - 8.1 g/dL -  Total Bilirubin 0.3 - 1.2 mg/dL -  Alkaline Phos 38 - 126 U/L -  AST 15 - 41 U/L -  ALT 14 - 54 U/L -   CBC Latest  Ref Rng & Units 11/22/2016  WBC 3.6 - 11.0 K/uL 6.1  Hemoglobin 12.0 - 16.0 g/dL 11.6(L)  Hematocrit 35.0 - 47.0 % 35.0  Platelets 150 - 440 K/uL 271    @IMAGES @  Dg Chest 2 View  Result Date: 11/20/2016 CLINICAL DATA:  Increasing shortness of breath over the past month. EXAM: CHEST  2 VIEW COMPARISON:  Chest x-ray dated August 27, 2012. FINDINGS: The cardiomediastinal silhouette is normal in size. Normal pulmonary vascularity. Trace bilateral pleural effusions. No focal consolidation or pneumothorax. No acute osseous abnormality. IMPRESSION: Trace bilateral pleural effusions. Electronically Signed   By: Titus Dubin M.D.   On: 11/20/2016 09:45   Ct Angio Chest Pe W And/or Wo Contrast  Result Date: 11/20/2016 CLINICAL DATA:  Shortness of breath.  Abdominal distension. EXAM: CT ANGIOGRAPHY CHEST CT ABDOMEN AND PELVIS WITH CONTRAST TECHNIQUE: Multidetector CT imaging of the chest was performed using the standard protocol during bolus administration of intravenous contrast. Multiplanar CT image reconstructions and  MIPs were obtained to evaluate the vascular anatomy. Multidetector CT imaging of the abdomen and pelvis was performed using the standard protocol during bolus administration of intravenous contrast. CONTRAST:  75 mL of Isovue 370 intravenously. COMPARISON:  Radiographs of same day.  CT scan of Jun 21, 2013. FINDINGS: CTA CHEST FINDINGS Cardiovascular: Satisfactory opacification of the pulmonary arteries to the segmental level. No evidence of pulmonary embolism. Normal heart size. No pericardial effusion. Atherosclerosis of thoracic aorta is noted without aneurysm formation. Coronary artery calcifications are noted. Mediastinum/Nodes: Moderate size sliding-type hiatal hernia is noted. Wall thickening of distal esophagus is noted concerning for inflammation. Thyroid gland is unremarkable. No mediastinal adenopathy is noted. Lungs/Pleura: No pneumothorax is noted. Mild bilateral pleural effusions are noted with adjacent subsegmental atelectasis. Musculoskeletal: No chest wall abnormality. No acute or significant osseous findings. Postsurgical changes are seen involving both breasts. Review of the MIP images confirms the above findings. CT ABDOMEN and PELVIS FINDINGS Hepatobiliary: Minimal cholelithiasis is noted. No abnormality is seen in the liver. Pancreas: Stable 3.3 cm cystic lesion seen arising from pancreatic tail. No ductal dilatation or inflammation is noted. Spleen: Normal in size without focal abnormality. Adrenals/Urinary Tract: Adrenal glands appear normal. Stable bilateral renal cysts are noted. No hydronephrosis or renal obstruction is noted. No renal or ureteral calculi are noted. Urinary bladder is mildly distended, with air-fluid level present superiorly, suggesting recent instrumentation. Stomach/Bowel: Stomach is within normal limits. Appendix appears normal. No evidence of bowel wall thickening, distention, or inflammatory changes. Vascular/Lymphatic: Aortic atherosclerosis. No enlarged abdominal or  pelvic lymph nodes. Reproductive: 7.1 x 4.7 cm lobulated and heterogeneously enhancing right adnexal mass is noted concerning for ovarian malignancy. Uterus and left ovary are unremarkable. Other: Moderate ascites is noted.  No definite hernia is noted. Musculoskeletal: No acute or significant osseous findings. Review of the MIP images confirms the above findings. IMPRESSION: No definite evidence of pulmonary embolus. 7.1 x 4.4 cm lobulated right adnexal mass is noted concerning for ovarian malignancy. Moderate ascites is noted. Aortic atherosclerosis. Minimal cholelithiasis. Moderate size sliding-type hiatal hernia is noted. Moderate wall thickening of distal esophagus is noted concerning for inflammation or neoplasm. Endoscopy is recommended for further evaluation. Stable 3.3 cm cystic lesion arising from pancreatic tail. Coronary artery calcifications are noted suggesting coronary artery disease. Electronically Signed   By: Marijo Conception, M.D.   On: 11/20/2016 13:31   Ct Abdomen Pelvis W Contrast  Result Date: 11/20/2016 CLINICAL DATA:  Shortness of breath.  Abdominal distension. EXAM:  CT ANGIOGRAPHY CHEST CT ABDOMEN AND PELVIS WITH CONTRAST TECHNIQUE: Multidetector CT imaging of the chest was performed using the standard protocol during bolus administration of intravenous contrast. Multiplanar CT image reconstructions and MIPs were obtained to evaluate the vascular anatomy. Multidetector CT imaging of the abdomen and pelvis was performed using the standard protocol during bolus administration of intravenous contrast. CONTRAST:  75 mL of Isovue 370 intravenously. COMPARISON:  Radiographs of same day.  CT scan of Jun 21, 2013. FINDINGS: CTA CHEST FINDINGS Cardiovascular: Satisfactory opacification of the pulmonary arteries to the segmental level. No evidence of pulmonary embolism. Normal heart size. No pericardial effusion. Atherosclerosis of thoracic aorta is noted without aneurysm formation. Coronary  artery calcifications are noted. Mediastinum/Nodes: Moderate size sliding-type hiatal hernia is noted. Wall thickening of distal esophagus is noted concerning for inflammation. Thyroid gland is unremarkable. No mediastinal adenopathy is noted. Lungs/Pleura: No pneumothorax is noted. Mild bilateral pleural effusions are noted with adjacent subsegmental atelectasis. Musculoskeletal: No chest wall abnormality. No acute or significant osseous findings. Postsurgical changes are seen involving both breasts. Review of the MIP images confirms the above findings. CT ABDOMEN and PELVIS FINDINGS Hepatobiliary: Minimal cholelithiasis is noted. No abnormality is seen in the liver. Pancreas: Stable 3.3 cm cystic lesion seen arising from pancreatic tail. No ductal dilatation or inflammation is noted. Spleen: Normal in size without focal abnormality. Adrenals/Urinary Tract: Adrenal glands appear normal. Stable bilateral renal cysts are noted. No hydronephrosis or renal obstruction is noted. No renal or ureteral calculi are noted. Urinary bladder is mildly distended, with air-fluid level present superiorly, suggesting recent instrumentation. Stomach/Bowel: Stomach is within normal limits. Appendix appears normal. No evidence of bowel wall thickening, distention, or inflammatory changes. Vascular/Lymphatic: Aortic atherosclerosis. No enlarged abdominal or pelvic lymph nodes. Reproductive: 7.1 x 4.7 cm lobulated and heterogeneously enhancing right adnexal mass is noted concerning for ovarian malignancy. Uterus and left ovary are unremarkable. Other: Moderate ascites is noted.  No definite hernia is noted. Musculoskeletal: No acute or significant osseous findings. Review of the MIP images confirms the above findings. IMPRESSION: No definite evidence of pulmonary embolus. 7.1 x 4.4 cm lobulated right adnexal mass is noted concerning for ovarian malignancy. Moderate ascites is noted. Aortic atherosclerosis. Minimal cholelithiasis.  Moderate size sliding-type hiatal hernia is noted. Moderate wall thickening of distal esophagus is noted concerning for inflammation or neoplasm. Endoscopy is recommended for further evaluation. Stable 3.3 cm cystic lesion arising from pancreatic tail. Coronary artery calcifications are noted suggesting coronary artery disease. Electronically Signed   By: Marijo Conception, M.D.   On: 11/20/2016 13:31   US Paracentesis  Result Date: 11/22/2016 INDICATION: Ascites EXAM: ULTRASOUND GUIDED PARACENTESIS MEDICATIONS: None. COMPLICATIONS: None immediate. PROCEDURE: Informed written consent was obtained from the patient after a discussion of the risks, benefits and alternatives to treatment. A timeout was performed prior to the initiation of the procedure. Initial ultrasound scanning demonstrates a large amount of ascites within the right lower abdominal quadrant. The right lower abdomen was prepped and draped in the usual sterile fashion. 1% lidocaine was used for local anesthesia. Following this, a 6 Fr Safe-T-Centesis catheter was introduced. An ultrasound image was saved for documentation purposes. The paracentesis was performed. The catheter was removed and a dressing was applied. The patient tolerated the procedure well without immediate post procedural complication. FINDINGS: A total of approximately 4.7 L of yellow fluid was removed. Samples were sent to the laboratory as requested by the clinical team. IMPRESSION: Successful ultrasound-guided paracentesis yielding 4.7 liters of  peritoneal fluid. Electronically Signed   By: Inez Catalina M.D.   On: 11/22/2016 12:15    Assessment and plan-  Patient is a 83 y.o. female or has a remote history of breast cancer now presenting with 2-3 weeks of abdominal distention, found to have ascites and adnexal mass on CT, elevated CA125, s/p paracentesis, cytology pending.   1 Ascites and adnexal mass, with elevated CA 125, most likely ovarian cancer. Awaiting cytology  from paracentesis fluid. If ovarian cancer was confirmed, most likely need combination of chemotherapy treatment as well as debulking surgery. I have referred this patient to GYN oncology nurse navigator Kristi for coordination of an evaluation by GYN on current and PET scan. Explained to patient and  daughter what to expect in the next couple of days, including been called for an appointment with GYN onc, PET scan and a follow-up appointment with me. Patient told me that she is open for chemotherapy if needed. She will also follow up with me outpatient for pathology review and discussion of chemotherapy options.  2. History of breast cancer. Will need genetic testing. Will discuss outpatient.  Total face to face encounter time for this patient visit was 35 min. >50% of the time was  spent in counseling and coordination of care.  Dr. Earlie Server, MD, PhD [facility] at [facility] Pager- [phone] 11/22/2016

## 2016-11-22 NOTE — Telephone Encounter (Signed)
  Oncology Nurse Navigator Documentation Received phone referral from Dr. Tasia Catchings to see Ms. Corkins in [place] clinic. We can see her 10/17 at 1400. Schedulers notified to notify inpatient unit of appointment. Navigator Location: [facility] (11/22/16 1400)   )Navigator Encounter Type: Telephone (11/22/16 1400)                                                    Time Spent with Patient: 15 (11/22/16 1400)

## 2016-11-22 NOTE — Care Management Important Message (Signed)
Important Message  Patient Details  Name: Ebony Navarro MRN: [id number] Date of Birth: 1933-04-16   Medicare Important Message Given:  Yes    Shelbie Ammons, RN 11/22/2016, 8:09 AM

## 2016-11-22 NOTE — Progress Notes (Signed)
Patient has been NPO since midnight for paracentesis planned for today.

## 2016-11-22 NOTE — Care Management Note (Signed)
Case Management Note  Patient Details  Name: GWENDOLYNN MERKEY MRN: [id number] Date of Birth: [birth year]  Subjective/Objective:   Admitted to [facility] with the diagnosis of ascites. Lives alone. Daughter is [name] [phone]). Last seen Dr. Caryl Comes 2 weeks ago. Prescriptions are filled at [facility] in [place]. 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  No home Health. No skilled facility. No home oxygen. No medical equipment in the home.  Takes care of all basic and instrumental activities of daily living herself, drives. No falls. Good appetite.               Action/Plan: Paracentesis completed today.  No follow-up needs identified.   Expected Discharge Date:  11/22/16               Expected Discharge Plan:     In-House Referral:     Discharge planning Services     Post Acute Care Choice:    Choice offered to:     DME Arranged:    DME Agency:     HH Arranged:    HH Agency:     Status of Service:     If discussed at [employer], dates discussed:    Additional Comments:  Shelbie Ammons, RN MSN CCM Care Management [phone] 11/22/2016, 3:03 PM

## 2016-11-23 ENCOUNTER — Telehealth: Payer: Self-pay

## 2016-11-23 LAB — PH, BODY FLUID: pH, Body Fluid: 7.4

## 2016-11-23 NOTE — Telephone Encounter (Signed)
  Oncology Nurse Navigator Documentation Received call from pathology. Preliminary report of peritoneal fluid is positive for adenocarcinoma. Dr. Tasia Catchings notified. Navigator Location: [facility] (11/23/16 1100)   )Navigator Encounter Type: Telephone (11/23/16 1100) Telephone: Diagnostic Results (11/23/16 1100)                                                  Time Spent with Patient: 15 (11/23/16 1100)

## 2016-11-24 ENCOUNTER — Other Ambulatory Visit: Payer: Self-pay | Admitting: Oncology

## 2016-11-24 ENCOUNTER — Encounter: Payer: Self-pay | Admitting: Oncology

## 2016-11-24 ENCOUNTER — Inpatient Hospital Stay: Payer: Medicare Other | Attending: Obstetrics and Gynecology | Admitting: Obstetrics and Gynecology

## 2016-11-24 VITALS — BP 152/93 | HR 97 | Temp 98.6°F | Ht 67.0 in | Wt 174.8 lb

## 2016-11-24 DIAGNOSIS — E871 Hypo-osmolality and hyponatremia: Secondary | ICD-10-CM | POA: Insufficient documentation

## 2016-11-24 DIAGNOSIS — K921 Melena: Secondary | ICD-10-CM | POA: Insufficient documentation

## 2016-11-24 DIAGNOSIS — N183 Chronic kidney disease, stage 3 (moderate): Secondary | ICD-10-CM | POA: Insufficient documentation

## 2016-11-24 DIAGNOSIS — C569 Malignant neoplasm of unspecified ovary: Secondary | ICD-10-CM | POA: Diagnosis not present

## 2016-11-24 DIAGNOSIS — K8020 Calculus of gallbladder without cholecystitis without obstruction: Secondary | ICD-10-CM | POA: Insufficient documentation

## 2016-11-24 DIAGNOSIS — K449 Diaphragmatic hernia without obstruction or gangrene: Secondary | ICD-10-CM | POA: Insufficient documentation

## 2016-11-24 DIAGNOSIS — R197 Diarrhea, unspecified: Secondary | ICD-10-CM | POA: Diagnosis not present

## 2016-11-24 DIAGNOSIS — E785 Hyperlipidemia, unspecified: Secondary | ICD-10-CM | POA: Insufficient documentation

## 2016-11-24 DIAGNOSIS — I2510 Atherosclerotic heart disease of native coronary artery without angina pectoris: Secondary | ICD-10-CM | POA: Diagnosis not present

## 2016-11-24 DIAGNOSIS — Z853 Personal history of malignant neoplasm of breast: Secondary | ICD-10-CM | POA: Diagnosis not present

## 2016-11-24 DIAGNOSIS — I700 Atherosclerosis of aorta: Secondary | ICD-10-CM

## 2016-11-24 DIAGNOSIS — Z923 Personal history of irradiation: Secondary | ICD-10-CM | POA: Diagnosis not present

## 2016-11-24 DIAGNOSIS — I129 Hypertensive chronic kidney disease with stage 1 through stage 4 chronic kidney disease, or unspecified chronic kidney disease: Secondary | ICD-10-CM

## 2016-11-24 DIAGNOSIS — Z5111 Encounter for antineoplastic chemotherapy: Secondary | ICD-10-CM | POA: Insufficient documentation

## 2016-11-24 DIAGNOSIS — Z79899 Other long term (current) drug therapy: Secondary | ICD-10-CM | POA: Diagnosis not present

## 2016-11-24 DIAGNOSIS — N838 Other noninflammatory disorders of ovary, fallopian tube and broad ligament: Secondary | ICD-10-CM

## 2016-11-24 DIAGNOSIS — R180 Malignant ascites: Secondary | ICD-10-CM

## 2016-11-24 DIAGNOSIS — R000 Tachycardia, unspecified: Secondary | ICD-10-CM | POA: Insufficient documentation

## 2016-11-24 HISTORY — DX: Malignant neoplasm of unspecified ovary: C56.9

## 2016-11-24 LAB — CYTOLOGY - NON PAP

## 2016-11-24 NOTE — Progress Notes (Signed)
START ON PATHWAY REGIMEN - Ovarian     A cycle is every 21 days:     Paclitaxel      Carboplatin   **Always confirm dose/schedule in your pharmacy ordering system**    Patient Characteristics: Newly Diagnosed, Neoadjuvant Therapy AJCC T Category: TX AJCC N Category: NX AJCC M Category: M0 Therapeutic Status: Newly Diagnosed, Neoadjuvant Therapy AJCC 8 Stage Grouping: Unknown Intent of Therapy: Curative Intent, Discussed with Patient

## 2016-11-24 NOTE — Progress Notes (Signed)
Gynecologic Oncology Consult Visit   Referring Provider: Dr Tasia Catchings  Chief Concern: ovarian cancer  Subjective:  Ebony Navarro is a 83 y.o. female who is seen in consultation for new advanced ovarian cancer.  Admitted to [facility] recently with ascites and just discharged.  She is status post ultrasound guided paracentesis for therapeutic and diagnostic purposes, and 5 L fluid removed. Path showed adenocarcinoma - PAX8 positive.  Patient subjectively felt significant improvement of abdomen fullness, dyspepsia and belching with paracentesis, but now fluid reaccumulating. She also notes some prolapse of her bladder, probably due to pressure from ascites.  No incontinence. CA125 is 2,658.  Patient denies abdominal pain, fever or chills.  Accompanied by her daughter.   Patient has history of bilateral breast cancer in the remote past > 10 years ago.  Does not think she had genetic testing for BRCA1/2.   CT scan C/A/P - 11/21/16 IMPRESSION: No definite evidence of pulmonary embolus.  7.1 x 4.4 cm lobulated right adnexal mass is noted concerning for ovarian malignancy. Moderate ascites is noted.  Moderate wall thickening of distal esophagus is noted concerning for inflammation or neoplasm. Endoscopy is recommended for further evaluation.  Stable 3.3 cm cystic lesion arising from pancreatic tail.  Coronary artery calcifications are noted suggesting coronary artery disease.   Problem List: Patient Active Problem List   Diagnosis Date Noted  . Abdominal pain 11/20/2016  . Ascites 11/20/2016  . Ovarian mass 11/20/2016  . Absolute anemia 01/22/2015  . Anxiety 01/22/2015  . Malignant neoplasm of breast ([place]) 01/22/2015  . Clinical depression 01/22/2015  . Gastric catarrh 01/22/2015  . Blood glucose elevated 01/22/2015  . HLD (hyperlipidemia) 01/22/2015  . BP (high blood pressure) 01/22/2015  . Acquired atrophy of thyroid 04/02/2014  . Frequent UTI 11/23/2013  . Chronic kidney  disease (CKD), stage III (moderate) ([place]) 10/02/2013    Past Medical History: Past Medical History:  Diagnosis Date  . Breast cancer ([place]) 2005   Right, radiation and lumpectomy  . Breast cancer ([place]) 2002   Left, Chemo, radiation and lumpectomy  . Cancer ([place])   . Hypertension     Past Surgical History: Past Surgical History:  Procedure Laterality Date  . BREAST BIOPSY Left 2010   negative stereotactic biopsy  . BREAST LUMPECTOMY Right 2005   positive  . BREAST LUMPECTOMY Left 2002   positive    Family History: Family History  Problem Relation Age of Onset  . Hypertension Mother   . Hypertension Father     Social History: Social History   Social History  . Marital status: Widowed    Spouse name: N/A  . Number of children: N/A  . Years of education: N/A   Occupational History  . Not on file.   Social History Main Topics  . Smoking status: Never Smoker  . Smokeless tobacco: Never Used  . Alcohol use No  . Drug use: No  . Sexual activity: No   Other Topics Concern  . Not on file   Social History Narrative  . No narrative on file    Allergies: Allergies  Allergen Reactions  . Ace Inhibitors Other (See Comments)    hyperkalemia  . Atenolol Other (See Comments)    Worsening palpitations  . Iodine Other (See Comments)    11/20/16: per conversation with pt, pt with allergy to topical iodine and betadine.  Pt states she has had IV contrast in the past with now issues.  . Metoprolol Tartrate Other (See Comments)  intolerant  . Omeprazole Diarrhea  . Povidone-Iodine Other (See Comments)  . Quinolones Other (See Comments)  . Rofecoxib Nausea Only  . Ciprofloxacin Rash  . Doxycycline Calcium Rash  . Nitrofurantoin Rash  . Sulfa Antibiotics Rash    Current Medications: Current Outpatient Prescriptions  Medication Sig Dispense Refill  . amoxicillin-clavulanate (AUGMENTIN) 875-125 MG tablet Take 1 tablet by mouth every 12 (twelve) hours. 12 tablet 0   . atorvastatin (LIPITOR) 40 MG tablet Take 40 mg by mouth daily.     . Cholecalciferol (VITAMIN D3) 1000 units CAPS Take 1,000 Units by mouth daily.    Marland Kitchen diltiazem (CARDIZEM CD) 120 MG 24 hr capsule TAKE 1 CAPSULE BY MOUTH EVERY OTHER DAY    . famotidine (PEPCID) 20 MG tablet Take 1 tablet (20 mg total) by mouth 2 (two) times daily. 60 tablet 0  . levothyroxine (SYNTHROID, LEVOTHROID) 50 MCG tablet TAKE 1 TABLET BY MOUTH DAILY ON AN EMPTY STOMACH WITH A GLASS OF WATER 30-60 MIN BEFORE BREAKFAST    . Multiple Vitamin (MULTI-VITAMINS) TABS Take 1 tablet by mouth daily.    . promethazine (PHENERGAN) 25 MG tablet Take 1 tablet (25 mg total) by mouth every 6 (six) hours as needed for nausea or vomiting. 20 tablet 0   No current facility-administered medications for this visit.     Review of Systems General: negative for, fevers, chills, fatigue, changes in sleep, changes in weight or appetite Skin: negative for changes in color, texture, moles or lesions Eyes: negative for, changes in vision, pain, diplopia HEENT: negative for, change in hearing, pain, discharge, tinnitus, vertigo, voice changes, sore throat, neck masses Breasts: negative for breast lumps Pulmonary: negative for, dyspnea, orthopnea, productive cough Cardiac: negative for, palpitations, syncope, pain, discomfort, pressure Gastrointestinal: negative for, nausea, vomiting, hematemesis, hematochezia Genitourinary/Sexual: negative for, dysuria, discharge, hesitancy, nocturia, retention, stones, infections, STD's, incontinence Ob/Gyn: negative for, irregular bleeding, pain Musculoskeletal: negative for, pain, stiffness, swelling, range of motion limitation Hematology: negative for, easy bruising, bleeding Neurologic/Psych: negative for, headaches, seizures, paralysis, weakness, tremor, change in gait, change in sensation, mood swings, depression, anxiety, change in memory  Objective:  Physical Examination:  BP (!) 152/93 (BP  Location: Right Wrist, Patient Position: Sitting)   Pulse 97   Temp 98.6 F (37 C) (Tympanic)   Ht 5' 7"  (1.702 m)   Wt 174 lb 12.8 oz (79.3 kg)   BMI 27.38 kg/m    ECOG Performance Status: 1 - Symptomatic but completely ambulatory  General appearance: alert, cooperative and appears stated age HEENT:PERRLA, neck supple with midline trachea and thyroid without masses Lymph node survey: non-palpable, axillary, inguinal, supraclavicular Cardiovascular: regular rate and rhythm, no murmurs or gallops Respiratory: normal air entry, lungs clear to auscultation and no rales, rhonchi or wheezing Breast exam: not examined. Abdomen: protuberant, nontender, no masses palpated, no hernias and well healed incision Back: inspection of back is normal Extremities: extremities normal, atraumatic, no cyanosis or edema Skin exam - normal coloration and turgor, no rashes, no suspicious skin lesions noted. Neurological exam reveals alert, oriented, normal speech, no focal findings or movement disorder noted.  Pelvic: exam chaperoned by nurse;  Vulva: normal appearing vulva with no masses, tenderness or lesions; Vagina: anterior cystocele, but no prolapse lying down; Adnexa: some fullness due to ascites, no masses Uterus: uterus is normal size, shape, consistency and nontender; Cervix: anteverted; Rectal: normal rectal, no masses, but some fullness.    Assessment:  Ebony Navarro is a 83 y.o. 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 She is 83 years old, but performance status is excellent. History of bilateral breast cancer in the remote past and got AC chemotherapy.   Medical co-morbidities complicating care: HTN.  Plan:   Problem List Items Addressed This Visit    None    Visit Diagnoses    Mass, ovarian    -  Primary     We discussed options for management  including neoadjuvant chemotherapy at [facility] versus coming to [facility] for clinical trial that would include diagnostic laparoscopy and biopsy and then Pembrolizumab with primary debulking 10 days later if she was deemed optimally debulkable.  After a long discussion with the patient and daughter they would like to have neoadjuvant chemotherapy at [facility].  We can also add bevacizumab in an attempt to better control the ascites, which has re-accumulated rapidly only a few days after paracentesis.  Dr Tasia Catchings will start treatment this Friday with Carboplatin AUC 6 and Taxol 175 mg per sq M + Bev IV q3wks.  We can see her back after 3 cycles with CT scan for consideration of interval surgery.  Will hold Bev with cycle 3 in preparation for surgery.  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   The patient's diagnosis, an outline of the further diagnostic and laboratory studies which will be required, the recommendation, and alternatives were discussed.  All questions were answered to the patient's satisfaction.  A total of 60 minutes were spent with the patient/family today; 40% was spent in education, counseling and coordination of care for ovarian cancer.    Mellody Drown, MD  CC:  Adin Hector, MD [place] [facility], [place] [address] [phone]

## 2016-11-24 NOTE — Progress Notes (Signed)
Patient here for initial visit. She states she feels bloated today.

## 2016-11-24 NOTE — Progress Notes (Signed)
Discussed with Dr.Berchuck, will start patient on neoadjuvant chemotherapy with Carboplatin AUC 6 and Taxol 175 mg per sq M + Bev IV q3wks. (Bev will be for the first 2 cycles).

## 2016-11-25 LAB — BODY FLUID CULTURE: CULTURE: NO GROWTH

## 2016-11-25 NOTE — Progress Notes (Signed)
  Oncology Nurse Navigator Documentation Request for port a cath sent to AV&V via inbox to Devona Konig. Navigator Location: [facility] (11/25/16 1300)   )Navigator Encounter Type: Other (11/25/16 1300)                                                    Time Spent with Patient: 15 (11/25/16 1300)

## 2016-11-26 ENCOUNTER — Inpatient Hospital Stay: Payer: Medicare Other | Admitting: *Deleted

## 2016-11-26 ENCOUNTER — Encounter: Payer: Self-pay | Admitting: Oncology

## 2016-11-26 ENCOUNTER — Telehealth (INDEPENDENT_AMBULATORY_CARE_PROVIDER_SITE_OTHER): Payer: Self-pay

## 2016-11-26 ENCOUNTER — Inpatient Hospital Stay (HOSPITAL_BASED_OUTPATIENT_CLINIC_OR_DEPARTMENT_OTHER): Payer: Medicare Other | Admitting: Oncology

## 2016-11-26 ENCOUNTER — Inpatient Hospital Stay: Payer: Medicare Other

## 2016-11-26 VITALS — BP 138/79 | HR 96 | Temp 97.9°F | Resp 18 | Wt 166.3 lb

## 2016-11-26 DIAGNOSIS — R180 Malignant ascites: Secondary | ICD-10-CM | POA: Diagnosis not present

## 2016-11-26 DIAGNOSIS — Z923 Personal history of irradiation: Secondary | ICD-10-CM | POA: Diagnosis not present

## 2016-11-26 DIAGNOSIS — Z853 Personal history of malignant neoplasm of breast: Secondary | ICD-10-CM | POA: Diagnosis not present

## 2016-11-26 DIAGNOSIS — C569 Malignant neoplasm of unspecified ovary: Secondary | ICD-10-CM

## 2016-11-26 DIAGNOSIS — Z5111 Encounter for antineoplastic chemotherapy: Secondary | ICD-10-CM | POA: Diagnosis not present

## 2016-11-26 DIAGNOSIS — Z79899 Other long term (current) drug therapy: Secondary | ICD-10-CM

## 2016-11-26 LAB — CBC WITH DIFFERENTIAL/PLATELET
BASOS PCT: 1 %
Basophils Absolute: 0 10*3/uL (ref 0–0.1)
EOS ABS: 0.1 10*3/uL (ref 0–0.7)
Eosinophils Relative: 3 %
HEMATOCRIT: 37.7 % (ref 35.0–47.0)
Hemoglobin: 12.5 g/dL (ref 12.0–16.0)
LYMPHS ABS: 0.8 10*3/uL — AB (ref 1.0–3.6)
Lymphocytes Relative: 15 %
MCH: 30.4 pg (ref 26.0–34.0)
MCHC: 33.2 g/dL (ref 32.0–36.0)
MCV: 91.5 fL (ref 80.0–100.0)
MONOS PCT: 8 %
Monocytes Absolute: 0.4 10*3/uL (ref 0.2–0.9)
Neutro Abs: 3.8 10*3/uL (ref 1.4–6.5)
Neutrophils Relative %: 73 %
Platelets: 322 10*3/uL (ref 150–440)
RBC: 4.12 MIL/uL (ref 3.80–5.20)
RDW: 13.6 % (ref 11.5–14.5)
WBC: 5.1 10*3/uL (ref 3.6–11.0)

## 2016-11-26 LAB — URINALYSIS, COMPLETE (UACMP) WITH MICROSCOPIC
BILIRUBIN URINE: NEGATIVE
Bacteria, UA: NONE SEEN
Glucose, UA: NEGATIVE mg/dL
Ketones, ur: NEGATIVE mg/dL
LEUKOCYTES UA: NEGATIVE
Nitrite: NEGATIVE
PH: 7 (ref 5.0–8.0)
Protein, ur: NEGATIVE mg/dL
SPECIFIC GRAVITY, URINE: 1.009 (ref 1.005–1.030)
Squamous Epithelial / LPF: NONE SEEN

## 2016-11-26 LAB — COMPREHENSIVE METABOLIC PANEL
ALBUMIN: 3 g/dL — AB (ref 3.5–5.0)
ALK PHOS: 66 U/L (ref 38–126)
ALT: 24 U/L (ref 14–54)
ANION GAP: 10 (ref 5–15)
AST: 42 U/L — ABNORMAL HIGH (ref 15–41)
BILIRUBIN TOTAL: 0.7 mg/dL (ref 0.3–1.2)
BUN: 10 mg/dL (ref 6–20)
CALCIUM: 8.8 mg/dL — AB (ref 8.9–10.3)
CO2: 25 mmol/L (ref 22–32)
Chloride: 102 mmol/L (ref 101–111)
Creatinine, Ser: 0.89 mg/dL (ref 0.44–1.00)
GFR calc non Af Amer: 59 mL/min — ABNORMAL LOW (ref >60)
GLUCOSE: 142 mg/dL — AB (ref 65–99)
POTASSIUM: 3.9 mmol/L (ref 3.5–5.1)
SODIUM: 137 mmol/L (ref 135–145)
TOTAL PROTEIN: 7.2 g/dL (ref 6.5–8.1)

## 2016-11-26 MED ORDER — PROCHLORPERAZINE MALEATE 10 MG PO TABS
10.0000 mg | ORAL_TABLET | Freq: Four times a day (QID) | ORAL | 1 refills | Status: DC | PRN
Start: 2016-11-26 — End: 2017-01-01

## 2016-11-26 MED ORDER — LIDOCAINE-PRILOCAINE 2.5-2.5 % EX CREA: TOPICAL_CREAM | CUTANEOUS | 3 refills | Status: DC

## 2016-11-26 MED ORDER — DIPHENHYDRAMINE HCL 50 MG/ML IJ SOLN
50.0000 mg | Freq: Once | INTRAMUSCULAR | Status: AC
  Administered 2016-11-26: 50 mg via INTRAVENOUS
  Filled 2016-11-26: qty 1

## 2016-11-26 MED ORDER — PALONOSETRON HCL INJECTION 0.25 MG/5ML
0.2500 mg | Freq: Once | INTRAVENOUS | Status: AC
Start: 2016-11-26 — End: 2016-11-26
  Administered 2016-11-26: 0.25 mg via INTRAVENOUS
  Filled 2016-11-26: qty 5

## 2016-11-26 MED ORDER — ONDANSETRON HCL 8 MG PO TABS: 8.0000 mg | ORAL_TABLET | Freq: Two times a day (BID) | ORAL | 1 refills | Status: DC | PRN

## 2016-11-26 MED ORDER — SODIUM CHLORIDE 0.9 % IV SOLN
15.0000 mg/kg | Freq: Once | INTRAVENOUS | Status: AC
  Administered 2016-11-26: 1200 mg via INTRAVENOUS
  Filled 2016-11-26: qty 48

## 2016-11-26 MED ORDER — DEXAMETHASONE 4 MG PO TABS: 8.0000 mg | ORAL_TABLET | Freq: Every day | ORAL | 1 refills | Status: DC

## 2016-11-26 MED ORDER — SODIUM CHLORIDE 0.9 % IV SOLN
20.0000 mg | Freq: Once | INTRAVENOUS | Status: AC
  Administered 2016-11-26: 20 mg via INTRAVENOUS
  Filled 2016-11-26: qty 2

## 2016-11-26 MED ORDER — SODIUM CHLORIDE 0.9 % IV SOLN
175.0000 mg/m2 | Freq: Once | INTRAVENOUS | Status: AC
  Administered 2016-11-26: 342 mg via INTRAVENOUS
  Filled 2016-11-26: qty 57

## 2016-11-26 MED ORDER — SODIUM CHLORIDE 0.9 % IV SOLN
Freq: Once | INTRAVENOUS | Status: AC
  Administered 2016-11-26: 11:00:00 via INTRAVENOUS
  Filled 2016-11-26: qty 1000

## 2016-11-26 MED ORDER — FAMOTIDINE IN NACL 20-0.9 MG/50ML-% IV SOLN
20.0000 mg | Freq: Once | INTRAVENOUS | Status: AC
  Administered 2016-11-26: 20 mg via INTRAVENOUS
  Filled 2016-11-26: qty 50

## 2016-11-26 MED ORDER — LORAZEPAM 0.5 MG PO TABS: 0.5000 mg | ORAL_TABLET | Freq: Four times a day (QID) | ORAL | 0 refills | Status: DC | PRN

## 2016-11-26 MED ORDER — CARBOPLATIN CHEMO INJECTION 600 MG/60ML
481.2000 mg | Freq: Once | INTRAVENOUS | Status: AC
  Administered 2016-11-26: 480 mg via INTRAVENOUS
  Filled 2016-11-26: qty 48

## 2016-11-26 NOTE — Progress Notes (Signed)
Hematology/Oncology Follow Up Visit [facility] Telephone:([phone] [phone]   Patient Care Team: Adin Hector, MD as PCP - General (Internal Medicine) Clent Jacks, RN as Registered Nurse  REASON FOR VISIT Follow up for treatment of recently diagnosed ovarian cancer.   HISTORY OF PRESENTING ILLNESS:  Ebony Navarro is a  83 y.o.  female with PMH listed below who who presented as a follow-up of his recent hospitalization due to malignant ascites.This is a 83 year old female who has a remote history breast cancer x2, status post surgery and adjuvant chemotherapy(likely AC), hypertension, thyroid disease presented with 2 weeks of increased abdominal distention/discomfort/dyspepsia and shortness of breath. Patient was found to have large amount of ascites. CT abdomen and pelvis with contrast showed 7.1 x 4.4 cm lobulated right ovarian mass concerning for ovarian malignancy. There is also moderate ascites. There is also moderate wall thickening of distal esophagus plus concerning for inflammation or malignancy. Patient reports that she has been her usual health until couple of weeks ago her abdomen started to be more distended with worsening of abdomen discomfort.she is also belching forcefully and with frequency.CA125 is 2,658  Patient's left breast cancer was in 2002, treatment including lumpectomy, chemotherapy and radiation. She also developed right side breast cancer in 2005 status post lumpectomy and radiation.  Ultrasound-guided paracentesis was done during her paracentesis, one of 5 L fluid was removed and the cytology was sent. Pathology reveals positive for malignancy, metastatic adenocarcinoma consistent with gynecologic origin.  Patient was seen by GYN oncology Dr. Limmie Patricia for evaluation and was recommended to start neoadjuvant chemotherapy with carboplatinum AUC of 6, and Taxol 1 75 mg/m x 4 cycles. Due to the large amount of  ascites, was suggested to also start on Avastin for 2 cycles. Patient will be evaluated again by GYN onc for possible debulking surgery.  Review of Systems  Constitutional: Negative for fever, night sweats, positive for decreased in appetite. Positive for weight loss, positive for fatigue. HENT: Negative for ear pain, hearing loss, nasal bleeding Eyes: Negative for eye pain, double vision   Respiratory: Negative for wheezing, shortness of breath, cough Cardiovascular: Negative for chest pain, palpitation.   Gastrointestinal: Negative abdominal pain, diarrhea, nausea vomiting, positive for belching, abdomen distention Endocrine: Negative  Genitourinary: Negative for dysuria, hematuria, frequency Skin: Negative for rash, iching, bruising Neurological: Negative for headache, dizziness, seizure Hematological: Negative for easy bruising/bleeding, lymph node enlargement Psychiatric/Behavioral: Negative for depression, anxiety, suicidality  MEDICAL HISTORY:  Past Medical History:  Diagnosis Date  . Breast cancer ([place]) 2005   Right, radiation and lumpectomy  . Breast cancer ([place]) 2002   Left, Chemo, radiation and lumpectomy  . Cancer ([place])   . Hypertension   . Ovarian cancer ([place]) 11/24/2016    SURGICAL HISTORY: Past Surgical History:  Procedure Laterality Date  . BREAST BIOPSY Left 2010   negative stereotactic biopsy  . BREAST LUMPECTOMY Right 2005   positive  . BREAST LUMPECTOMY Left 2002   positive    SOCIAL HISTORY: Social History   Social History  . Marital status: Widowed    Spouse name: N/A  . Number of children: N/A  . Years of education: N/A   Occupational History  . Not on file.   Social History Main Topics  . Smoking status: Never Smoker  . Smokeless tobacco: Never Used  . Alcohol use No  . Drug use: No  . Sexual activity: No   Other Topics Concern  . Not on  file   Social History Narrative  . No narrative on file    FAMILY HISTORY: Family History    Problem Relation Age of Onset  . Hypertension Mother   . Hypertension Father     ALLERGIES:  is allergic to ace inhibitors; atenolol; iodine; metoprolol tartrate; omeprazole; povidone-iodine; quinolones; rofecoxib; ciprofloxacin; doxycycline calcium; nitrofurantoin; and sulfa antibiotics.  MEDICATIONS:  Current Outpatient Prescriptions  Medication Sig Dispense Refill  . amoxicillin-clavulanate (AUGMENTIN) 875-125 MG tablet Take 1 tablet by mouth every 12 (twelve) hours. 12 tablet 0  . atorvastatin (LIPITOR) 40 MG tablet Take 40 mg by mouth daily.     Marland Kitchen diltiazem (CARDIZEM CD) 120 MG 24 hr capsule TAKE 1 CAPSULE BY MOUTH EVERY OTHER DAY    . levothyroxine (SYNTHROID, LEVOTHROID) 50 MCG tablet TAKE 1 TABLET BY MOUTH DAILY ON AN EMPTY STOMACH WITH A GLASS OF WATER 30-60 MIN BEFORE BREAKFAST    . Cholecalciferol (VITAMIN D3) 1000 units CAPS Take 1,000 Units by mouth daily.    Marland Kitchen dexamethasone (DECADRON) 4 MG tablet Take 2 tablets (8 mg total) by mouth daily. Start the day after chemotherapy for 2 days. (Patient not taking: Reported on 11/26/2016) 30 tablet 1  . lidocaine-prilocaine (EMLA) cream Apply to affected area once (Patient not taking: Reported on 11/26/2016) 30 g 3  . LORazepam (ATIVAN) 0.5 MG tablet Take 1 tablet (0.5 mg total) by mouth every 6 (six) hours as needed (Nausea or vomiting). (Patient not taking: Reported on 11/26/2016) 30 tablet 0  . Multiple Vitamin (MULTI-VITAMINS) TABS Take 1 tablet by mouth daily.    . ondansetron (ZOFRAN) 8 MG tablet Take 1 tablet (8 mg total) by mouth 2 (two) times daily as needed for refractory nausea / vomiting. Start on day 3 after chemo. (Patient not taking: Reported on 11/26/2016) 30 tablet 1  . prochlorperazine (COMPAZINE) 10 MG tablet Take 1 tablet (10 mg total) by mouth every 6 (six) hours as needed (Nausea or vomiting). (Patient not taking: Reported on 11/26/2016) 30 tablet 1  . promethazine (PHENERGAN) 25 MG tablet Take 1 tablet (25 mg total)  by mouth every 6 (six) hours as needed for nausea or vomiting. (Patient not taking: Reported on 11/26/2016) 20 tablet 0   No current facility-administered medications for this visit.    Facility-Administered Medications Ordered in Other Visits  Medication Dose Route Frequency Provider Last Rate Last Dose  . CARBOplatin (PARAPLATIN) 480 mg in sodium chloride 0.9 % 250 mL chemo infusion  480 mg Intravenous Once Earlie Server, MD      . PACLitaxel (TAXOL) 342 mg in sodium chloride 0.9 % 500 mL chemo infusion (> 80mg /m2)  175 mg/m2 (Treatment Plan Recorded) Intravenous Once Earlie Server, MD 186 mL/hr at 11/26/16 1203 342 mg at 11/26/16 1203      .  PHYSICAL EXAMINATION: ECOG PERFORMANCE STATUS: 1 - Symptomatic but completely ambulatory Vitals:   11/26/16 0930  BP: 138/79  Pulse: 96  Resp: 18  Temp: 97.9 F (36.6 C)   Filed Weights   11/26/16 0930  Weight: 166 lb 4.8 oz (75.4 kg)   GENERAL: No distress, well nourished.  SKIN:  No rashes or significant lesions  HEAD: Normocephalic, No masses, lesions, tenderness or abnormalities  EYES: Conjunctiva are pink, non icteric ENT: External ears normal ,lips , buccal mucosa, and tongue normal and mucous membranes are moist  LYMPH: No palpable cervical and axillary lymphadenopathy  LUNGS: Clear to auscultation, no crackles or wheezes HEART: Regular rate & rhythm, no murmurs, no  gallops, S1 normal and S2 normal  ABDOMEN: Abdomen soft, mildly distended nontender, normal bowel sounds, I did not appreciate any  masses or organomegaly  MUSCULOSKELETAL: No CVA tenderness and no tenderness on percussion of the back or rib cage.  EXTREMITIES: No edema, no skin discoloration or tenderness NEURO: Alert & oriented, no focal motor/sensory deficits.  LABORATORY DATA:  I have reviewed the data as listed Lab Results  Component Value Date   WBC 5.1 11/26/2016   HGB 12.5 11/26/2016   HCT 37.7 11/26/2016   MCV 91.5 11/26/2016   PLT 322 11/26/2016    Recent  Labs  11/20/16 0834 11/21/16 0454 11/22/16 0418 11/26/16 0847  NA 137 137 139 137  K 4.2 4.1 3.9 3.9  CL 102 102 106 102  CO2 25 25 25 25   GLUCOSE 117* 105* 113* 142*  BUN 17 17 16 10   CREATININE 1.21* 1.07* 0.87 0.89  CALCIUM 8.9 8.2* 8.1* 8.8*  GFRNONAA 41* 47* >60 59*  GFRAA 47* 55* >60 >60  PROT 7.8 6.5  --  7.2  ALBUMIN 3.4* 2.8*  --  3.0*  AST 36 29  --  42*  ALT 18 13*  --  24  ALKPHOS 69 58  --  66  BILITOT 1.5* 1.2  --  0.7    RADIOGRAPHIC STUDIES: I have personally reviewed the radiological images as listed and agreed with the findings in the report. 11/20/2016 CT abdomen Pelvis, CT Angio Chest PE protocol IMPRESSION: No definite evidence of pulmonary embolus. 7.1 x 4.4 cm lobulated right adnexal mass is noted concerning for ovarian malignancy. Moderate ascites is noted. Aortic atherosclerosis. Minimal cholelithiasis. Moderate size sliding-type hiatal hernia is noted. Moderate wall thickening of distal esophagus is noted concerning for inflammation or neoplasm. Endoscopy is recommended for further evaluation.Stable 3.3 cm cystic lesion arising from pancreatic tail. Coronary artery calcifications are noted suggesting coronary artery disease.   ASSESSMENT & PLAN:  1. Malignant neoplasm of ovary, unspecified laterality ([place])   2. Malignant ascites   3. History of breast cancer    # Pathology results discussed with patient. She has also been seen by GYN ONC and was recommended for new adjuvant chemotherapy. Chemotherapy class was offered, patient said that he she has chemotherapy class in the past for her breast cancer treatment, not needing another chemotherapy class.  I explained to the patient the risks and benefits of carboplatin and Taxol, Avastin including all but not limited to hair loss, mouth sore, nausea, vomiting, low blood counts, bleeding, and risk of life threatening infection and even death, secondary malignancy, worsening of high blood  pressure, proteinuria etc.  patient and her daughter voices understanding and willing to proceed.  # Patient does not have Mediport placed. As she is very symptomatic and chemotherapy need to be urgently started, the first dose of carboplatin and Taxol and Avastin will be given through peripheral vein. Patient and her daughter are aware that leak of the chemotherapy medication can potentially cause soft tissue irritation. They also is interested in Mediport placement. We'll refer to surgery to obtain Mediport placement. #Antiemetics-Zofran and Compazine, phenergan; EMLA cream sent to pharmacy. A list of antiemetics including Zofran, Compazine, Phenergan, steroids were printed and explained to patient daughter. Patient lives by herself. I advised her daughter to moving with patient for at least some time during the first cycle and help patient to cope with her chemotherapy induced symptoms and help with the medication as well.  daughter voices understanding..   #given  the history of both breast cancer and ovarian cancer, recommend patient getting genetic testing. Since she already got labs today she prefers to have  Genetic testing lab done at next visit.  All questions were answered. The patient knows to call the clinic with any problems questions or concerns.  Return of visit:  3 weeks on day 1 of cycle 2 carbo, Taxol, Avastin, with CBC, CMP, UA done prior to the visit.  Thank you for this kind referral and the opportunity to participate in the care of this patient. A copy of today's note is routed to referring provider    Earlie Server, MD, PhD Hematology Oncology [facility] at [facility] Pager- [phone] 11/26/2016

## 2016-11-26 NOTE — Telephone Encounter (Signed)
Attempted to contact the patient to schedule her port placement and had to leave a message for her to return my call.

## 2016-11-26 NOTE — Progress Notes (Signed)
Here for follow up. Per pt she has NO scripts for anti nausea medications. Dr Tasia Catchings related that scripts now sent to pharmacy. Pt and daughter informed and medication teaching done/CAW

## 2016-11-29 ENCOUNTER — Encounter (INDEPENDENT_AMBULATORY_CARE_PROVIDER_SITE_OTHER): Payer: Self-pay

## 2016-11-29 NOTE — Patient Instructions (Signed)

## 2016-11-30 ENCOUNTER — Telehealth: Payer: Self-pay | Admitting: *Deleted

## 2016-11-30 ENCOUNTER — Inpatient Hospital Stay: Payer: Medicare Other

## 2016-11-30 ENCOUNTER — Ambulatory Visit: Payer: Medicare Other | Admitting: Oncology

## 2016-11-30 ENCOUNTER — Inpatient Hospital Stay (HOSPITAL_BASED_OUTPATIENT_CLINIC_OR_DEPARTMENT_OTHER): Payer: Medicare Other | Admitting: Oncology

## 2016-11-30 VITALS — BP 123/76 | HR 109 | Temp 97.1°F | Resp 18

## 2016-11-30 DIAGNOSIS — K921 Melena: Secondary | ICD-10-CM

## 2016-11-30 DIAGNOSIS — Z923 Personal history of irradiation: Secondary | ICD-10-CM | POA: Diagnosis not present

## 2016-11-30 DIAGNOSIS — R197 Diarrhea, unspecified: Secondary | ICD-10-CM

## 2016-11-30 DIAGNOSIS — Z853 Personal history of malignant neoplasm of breast: Secondary | ICD-10-CM

## 2016-11-30 DIAGNOSIS — R180 Malignant ascites: Secondary | ICD-10-CM

## 2016-11-30 DIAGNOSIS — Z79899 Other long term (current) drug therapy: Secondary | ICD-10-CM

## 2016-11-30 DIAGNOSIS — C569 Malignant neoplasm of unspecified ovary: Secondary | ICD-10-CM

## 2016-11-30 DIAGNOSIS — Z5189 Encounter for other specified aftercare: Secondary | ICD-10-CM

## 2016-11-30 DIAGNOSIS — R000 Tachycardia, unspecified: Secondary | ICD-10-CM | POA: Diagnosis not present

## 2016-11-30 DIAGNOSIS — E871 Hypo-osmolality and hyponatremia: Secondary | ICD-10-CM | POA: Diagnosis not present

## 2016-11-30 DIAGNOSIS — Z5111 Encounter for antineoplastic chemotherapy: Secondary | ICD-10-CM | POA: Diagnosis not present

## 2016-11-30 LAB — CBC WITH DIFFERENTIAL/PLATELET
BASOS ABS: 0 10*3/uL (ref 0–0.1)
BASOS PCT: 0 %
Eosinophils Absolute: 0 10*3/uL (ref 0–0.7)
Eosinophils Relative: 1 %
HEMATOCRIT: 42.9 % (ref 35.0–47.0)
HEMOGLOBIN: 14 g/dL (ref 12.0–16.0)
LYMPHS PCT: 13 %
Lymphs Abs: 0.8 10*3/uL — ABNORMAL LOW (ref 1.0–3.6)
MCH: 30.2 pg (ref 26.0–34.0)
MCHC: 32.6 g/dL (ref 32.0–36.0)
MCV: 92.4 fL (ref 80.0–100.0)
MONO ABS: 0 10*3/uL — AB (ref 0.2–0.9)
Monocytes Relative: 1 %
NEUTROS ABS: 5.3 10*3/uL (ref 1.4–6.5)
NEUTROS PCT: 85 %
Platelets: 278 10*3/uL (ref 150–440)
RBC: 4.65 MIL/uL (ref 3.80–5.20)
RDW: 14 % (ref 11.5–14.5)
WBC: 6.3 10*3/uL (ref 3.6–11.0)

## 2016-11-30 LAB — COMPREHENSIVE METABOLIC PANEL
ALBUMIN: 3.9 g/dL (ref 3.5–5.0)
ALT: 40 U/L (ref 14–54)
ANION GAP: 15 (ref 5–15)
AST: 55 U/L — ABNORMAL HIGH (ref 15–41)
Alkaline Phosphatase: 77 U/L (ref 38–126)
BUN: 23 mg/dL — ABNORMAL HIGH (ref 6–20)
CALCIUM: 9.1 mg/dL (ref 8.9–10.3)
CO2: 21 mmol/L — AB (ref 22–32)
Chloride: 95 mmol/L — ABNORMAL LOW (ref 101–111)
Creatinine, Ser: 0.96 mg/dL (ref 0.44–1.00)
GFR calc non Af Amer: 54 mL/min — ABNORMAL LOW (ref >60)
GLUCOSE: 115 mg/dL — AB (ref 65–99)
POTASSIUM: 4.1 mmol/L (ref 3.5–5.1)
SODIUM: 131 mmol/L — AB (ref 135–145)
TOTAL PROTEIN: 8.6 g/dL — AB (ref 6.5–8.1)
Total Bilirubin: 1.5 mg/dL — ABNORMAL HIGH (ref 0.3–1.2)

## 2016-11-30 MED ORDER — PROMETHAZINE HCL 25 MG PO TABS: 25.0000 mg | ORAL_TABLET | Freq: Four times a day (QID) | ORAL | 0 refills | Status: DC | PRN

## 2016-11-30 MED ORDER — MEGESTROL ACETATE 20 MG PO TABS: 20.0000 mg | ORAL_TABLET | Freq: Every day | ORAL | 0 refills | Status: AC

## 2016-11-30 NOTE — Telephone Encounter (Signed)
Patient called then daughter to report that she is having yellow watery diarrhea with streaks of blood in it. She had her first chemotherapy on Friday and is in chemotherapy class today.  I discussed with J Burns who ordered labs, and will see her in chemotherapy class to see if she wants to be seen. I attempted to call daughter back and it went to voice mail

## 2016-11-30 NOTE — Progress Notes (Signed)
Patient here today as acute add on for symptom management clinic. Patient reports bloody diarrhea that started last night.

## 2016-11-30 NOTE — Progress Notes (Signed)
Symptom Management Consult note [facility]  [phone] [phone]  Patient Care Team: Adin Hector, MD as PCP - General (Internal Medicine) Clent Jacks, RN as Registered Nurse   Name of the patient: Ebony Navarro  [id number]  10-10-1933   Date of visit: 11/30/16  Diagnosis- Ovarian Cancer   Chief complaint/ Reason for visit- Diarrhea and blood in stool  Heme/Onc history: Ebony Navarro is a  83 y.o.  female with PMH listed below who who presented as a follow-up of his recent hospitalization due to malignant ascites.This is a 83 year old female who has a remote history breast cancer x2, status post surgery and adjuvant chemotherapy(likely AC), hypertension, thyroid disease presented with 2 weeks of increased abdominal distention/discomfort/dyspepsia and shortness of breath. Patient was found to have large amount of ascites. CT abdomen and pelvis with contrast showed 7.1 x 4.4 cm lobulated right ovarian mass concerning for ovarian malignancy. There is also moderate ascites. There is also moderate wall thickening of distal esophagus plus concerning for inflammation or malignancy. Patient reports that she has been her usual health until couple of weeks ago her abdomen started to be more distended with worsening of abdomen discomfort.she is also belching forcefully and with frequency.CA125 is 2,658  Patient's left breast cancer was in 2002, treatment including lumpectomy, chemotherapy and radiation. She also developed right side breast cancer in 2005 status post lumpectomy and radiation.  Ultrasound-guided paracentesis was done during her paracentesis, one of 5 L fluid was removed and the cytology was sent. Pathology reveals positive for malignancy, metastatic adenocarcinoma consistent with gynecologic origin.  Patient was seen by GYN oncology Dr. Limmie Patricia for evaluation and was recommended to start neoadjuvant chemotherapy  with carboplatinum AUC of 6, and Taxol 1 75 mg/m x 4 cycles. Due to the large amount of ascites, was suggested to also start on Avastin for 2 cycles. Patient will be evaluated again by GYN onc for possible debulking surgery.  Interval history- Patient was last seen in medical oncology by Dr. Tasia Catchings on 11/26/2016 for her first dose of carboplatin and Taxol and Avastin. At this visit she felt well. She offered no specific concerns. She was given her first dose of chemotherapy peripherally last Friday without incidence. She has been referred for port placement within the next few days. She was to return to clinic in 3 weeks for cycle 2.  Patient presents today for concerns of diarrhea and several episodes of small amounts of bright red blood in her stool. She states she had her first chemotherapy last Friday and tolerated it well. She has not had any nausea or vomiting but a decrease in her appetite. She's had mild fatigue but states this was to be expected. Her biggest concern is diarrhea that began yesterday. She states she had 5-6 bowel movements yesterday and 3 already today. Her bowel movements are yellow and brown with occasional streaking of bright red blood. She denies history of GI bleeding in the past. She denies any pain or straining with bowel movements. She denies any distinct smell to her bowel movement.She admits to hemorrhoids. Today she presented to her new patient chemotherapy class and was instructed to come to symptom management and to have labs drawn. She has not taken any medications to help with her diarrhea.  ECOG FS:1 - Symptomatic but completely ambulatory  Review of systems- Review of Systems  Constitutional: Positive for malaise/fatigue. Negative for chills and fever.  HENT: Negative.   Eyes:  Negative.   Respiratory: Negative.   Cardiovascular: Negative.  Negative for chest pain.  Gastrointestinal: Positive for blood in stool and diarrhea. Negative for abdominal pain, nausea and  vomiting.  Genitourinary: Negative.   Musculoskeletal: Negative.   Skin: Negative.   Neurological: Positive for weakness.  Endo/Heme/Allergies: Negative.   Psychiatric/Behavioral: Negative.      Current treatment- Carbo/Taxol X 4 and Avastin X 2  Allergies  Allergen Reactions  . Ace Inhibitors Other (See Comments)    hyperkalemia  . Atenolol Other (See Comments)    Worsening palpitations  . Iodine Other (See Comments)    11/20/16: per conversation with pt, pt with allergy to topical iodine and betadine.  Pt states she has had IV contrast in the past with now issues.  . Metoprolol Tartrate Other (See Comments)    intolerant  . Omeprazole Diarrhea  . Povidone-Iodine Other (See Comments)  . Quinolones Other (See Comments)  . Rofecoxib Nausea Only  . Ciprofloxacin Rash  . Doxycycline Calcium Rash  . Nitrofurantoin Rash  . Sulfa Antibiotics Rash     Past Medical History:  Diagnosis Date  . Breast cancer ([place]) 2005   Right, radiation and lumpectomy  . Breast cancer ([place]) 2002   Left, Chemo, radiation and lumpectomy  . Cancer ([place])   . Hypertension   . Ovarian cancer ([place]) 11/24/2016     Past Surgical History:  Procedure Laterality Date  . BREAST BIOPSY Left 2010   negative stereotactic biopsy  . BREAST LUMPECTOMY Right 2005   positive  . BREAST LUMPECTOMY Left 2002   positive    Social History   Social History  . Marital status: Widowed    Spouse name: N/A  . Number of children: N/A  . Years of education: N/A   Occupational History  . Not on file.   Social History Main Topics  . Smoking status: Never Smoker  . Smokeless tobacco: Never Used  . Alcohol use No  . Drug use: No  . Sexual activity: No   Other Topics Concern  . Not on file   Social History Narrative  . No narrative on file    Family History  Problem Relation Age of Onset  . Hypertension Mother   . Hypertension Father      Current Outpatient Prescriptions:  .   amoxicillin-clavulanate (AUGMENTIN) 875-125 MG tablet, Take 1 tablet by mouth every 12 (twelve) hours., Disp: 12 tablet, Rfl: 0 .  atorvastatin (LIPITOR) 40 MG tablet, Take 40 mg by mouth daily. , Disp: , Rfl:  .  Cholecalciferol (VITAMIN D3) 1000 units CAPS, Take 1,000 Units by mouth daily., Disp: , Rfl:  .  diltiazem (CARDIZEM CD) 120 MG 24 hr capsule, TAKE 1 CAPSULE BY MOUTH EVERY OTHER DAY, Disp: , Rfl:  .  levothyroxine (SYNTHROID, LEVOTHROID) 50 MCG tablet, TAKE 1 TABLET BY MOUTH DAILY ON AN EMPTY STOMACH WITH A GLASS OF WATER 30-60 MIN BEFORE BREAKFAST, Disp: , Rfl:  .  lidocaine-prilocaine (EMLA) cream, Apply to affected area once, Disp: 30 g, Rfl: 3 .  Multiple Vitamin (MULTI-VITAMINS) TABS, Take 1 tablet by mouth daily., Disp: , Rfl:  .  dexamethasone (DECADRON) 4 MG tablet, Take 2 tablets (8 mg total) by mouth daily. Start the day after chemotherapy for 2 days. (Patient not taking: Reported on 11/26/2016), Disp: 30 tablet, Rfl: 1 .  LORazepam (ATIVAN) 0.5 MG tablet, Take 1 tablet (0.5 mg total) by mouth every 6 (six) hours as needed (Nausea or vomiting). (Patient not taking:  Reported on 11/26/2016), Disp: 30 tablet, Rfl: 0 .  megestrol (MEGACE) 20 MG tablet, Take 1 tablet (20 mg total) by mouth daily., Disp: 30 tablet, Rfl: 0 .  ondansetron (ZOFRAN) 8 MG tablet, Take 1 tablet (8 mg total) by mouth 2 (two) times daily as needed for refractory nausea / vomiting. Start on day 3 after chemo. (Patient not taking: Reported on 11/26/2016), Disp: 30 tablet, Rfl: 1 .  prochlorperazine (COMPAZINE) 10 MG tablet, Take 1 tablet (10 mg total) by mouth every 6 (six) hours as needed (Nausea or vomiting). (Patient not taking: Reported on 11/26/2016), Disp: 30 tablet, Rfl: 1 .  promethazine (PHENERGAN) 25 MG tablet, Take 1 tablet (25 mg total) by mouth every 6 (six) hours as needed for nausea or vomiting., Disp: 20 tablet, Rfl: 0  Physical exam:  Vitals:   11/30/16 1225  BP: 123/76  Pulse: (!) 109  Resp:  18  Temp: (!) 97.1 F (36.2 C)  TempSrc: Tympanic   Physical Exam  Constitutional: She is oriented to person, place, and time and well-developed, well-nourished, and in no distress.  HENT:  Head: Normocephalic and atraumatic.  Eyes: Pupils are equal, round, and reactive to light.  Neck: Normal range of motion. Neck supple.  Cardiovascular: Normal rate, regular rhythm and normal heart sounds.   Pulmonary/Chest: Effort normal and breath sounds normal.  Abdominal: Soft. Bowel sounds are normal.  Genitourinary: Cervix normal.  Musculoskeletal: Normal range of motion.  Neurological: She is alert and oriented to person, place, and time.  Skin: Skin is warm and dry.     CMP Latest Ref Rng & Units 11/30/2016  Glucose 65 - 99 mg/dL 115(H)  BUN 6 - 20 mg/dL 23(H)  Creatinine 0.44 - 1.00 mg/dL 0.96  Sodium 135 - 145 mmol/L 131(L)  Potassium 3.5 - 5.1 mmol/L 4.1  Chloride 101 - 111 mmol/L 95(L)  CO2 22 - 32 mmol/L 21(L)  Calcium 8.9 - 10.3 mg/dL 9.1  Total Protein 6.5 - 8.1 g/dL 8.6(H)  Total Bilirubin 0.3 - 1.2 mg/dL 1.5(H)  Alkaline Phos 38 - 126 U/L 77  AST 15 - 41 U/L 55(H)  ALT 14 - 54 U/L 40   CBC Latest Ref Rng & Units 11/30/2016  WBC 3.6 - 11.0 K/uL 6.3  Hemoglobin 12.0 - 16.0 g/dL 14.0  Hematocrit 35.0 - 47.0 % 42.9  Platelets 150 - 440 K/uL 278    No images are attached to the encounter.  Dg Chest 2 View  Result Date: 11/20/2016 CLINICAL DATA:  Increasing shortness of breath over the past month. EXAM: CHEST  2 VIEW COMPARISON:  Chest x-ray dated August 27, 2012. FINDINGS: The cardiomediastinal silhouette is normal in size. Normal pulmonary vascularity. Trace bilateral pleural effusions. No focal consolidation or pneumothorax. No acute osseous abnormality. IMPRESSION: Trace bilateral pleural effusions. Electronically Signed   By: Titus Dubin M.D.   On: 11/20/2016 09:45   Ct Angio Chest Pe W And/or Wo Contrast  Result Date: 11/20/2016 CLINICAL DATA:  Shortness of  breath.  Abdominal distension. EXAM: CT ANGIOGRAPHY CHEST CT ABDOMEN AND PELVIS WITH CONTRAST TECHNIQUE: Multidetector CT imaging of the chest was performed using the standard protocol during bolus administration of intravenous contrast. Multiplanar CT image reconstructions and MIPs were obtained to evaluate the vascular anatomy. Multidetector CT imaging of the abdomen and pelvis was performed using the standard protocol during bolus administration of intravenous contrast. CONTRAST:  75 mL of Isovue 370 intravenously. COMPARISON:  Radiographs of same day.  CT scan of  Jun 21, 2013. FINDINGS: CTA CHEST FINDINGS Cardiovascular: Satisfactory opacification of the pulmonary arteries to the segmental level. No evidence of pulmonary embolism. Normal heart size. No pericardial effusion. Atherosclerosis of thoracic aorta is noted without aneurysm formation. Coronary artery calcifications are noted. Mediastinum/Nodes: Moderate size sliding-type hiatal hernia is noted. Wall thickening of distal esophagus is noted concerning for inflammation. Thyroid gland is unremarkable. No mediastinal adenopathy is noted. Lungs/Pleura: No pneumothorax is noted. Mild bilateral pleural effusions are noted with adjacent subsegmental atelectasis. Musculoskeletal: No chest wall abnormality. No acute or significant osseous findings. Postsurgical changes are seen involving both breasts. Review of the MIP images confirms the above findings. CT ABDOMEN and PELVIS FINDINGS Hepatobiliary: Minimal cholelithiasis is noted. No abnormality is seen in the liver. Pancreas: Stable 3.3 cm cystic lesion seen arising from pancreatic tail. No ductal dilatation or inflammation is noted. Spleen: Normal in size without focal abnormality. Adrenals/Urinary Tract: Adrenal glands appear normal. Stable bilateral renal cysts are noted. No hydronephrosis or renal obstruction is noted. No renal or ureteral calculi are noted. Urinary bladder is mildly distended, with  air-fluid level present superiorly, suggesting recent instrumentation. Stomach/Bowel: Stomach is within normal limits. Appendix appears normal. No evidence of bowel wall thickening, distention, or inflammatory changes. Vascular/Lymphatic: Aortic atherosclerosis. No enlarged abdominal or pelvic lymph nodes. Reproductive: 7.1 x 4.7 cm lobulated and heterogeneously enhancing right adnexal mass is noted concerning for ovarian malignancy. Uterus and left ovary are unremarkable. Other: Moderate ascites is noted.  No definite hernia is noted. Musculoskeletal: No acute or significant osseous findings. Review of the MIP images confirms the above findings. IMPRESSION: No definite evidence of pulmonary embolus. 7.1 x 4.4 cm lobulated right adnexal mass is noted concerning for ovarian malignancy. Moderate ascites is noted. Aortic atherosclerosis. Minimal cholelithiasis. Moderate size sliding-type hiatal hernia is noted. Moderate wall thickening of distal esophagus is noted concerning for inflammation or neoplasm. Endoscopy is recommended for further evaluation. Stable 3.3 cm cystic lesion arising from pancreatic tail. Coronary artery calcifications are noted suggesting coronary artery disease. Electronically Signed   By: Marijo Conception, M.D.   On: 11/20/2016 13:31   Ct Abdomen Pelvis W Contrast  Result Date: 11/20/2016 CLINICAL DATA:  Shortness of breath.  Abdominal distension. EXAM: CT ANGIOGRAPHY CHEST CT ABDOMEN AND PELVIS WITH CONTRAST TECHNIQUE: Multidetector CT imaging of the chest was performed using the standard protocol during bolus administration of intravenous contrast. Multiplanar CT image reconstructions and MIPs were obtained to evaluate the vascular anatomy. Multidetector CT imaging of the abdomen and pelvis was performed using the standard protocol during bolus administration of intravenous contrast. CONTRAST:  75 mL of Isovue 370 intravenously. COMPARISON:  Radiographs of same day.  CT scan of Jun 21, 2013. FINDINGS: CTA CHEST FINDINGS Cardiovascular: Satisfactory opacification of the pulmonary arteries to the segmental level. No evidence of pulmonary embolism. Normal heart size. No pericardial effusion. Atherosclerosis of thoracic aorta is noted without aneurysm formation. Coronary artery calcifications are noted. Mediastinum/Nodes: Moderate size sliding-type hiatal hernia is noted. Wall thickening of distal esophagus is noted concerning for inflammation. Thyroid gland is unremarkable. No mediastinal adenopathy is noted. Lungs/Pleura: No pneumothorax is noted. Mild bilateral pleural effusions are noted with adjacent subsegmental atelectasis. Musculoskeletal: No chest wall abnormality. No acute or significant osseous findings. Postsurgical changes are seen involving both breasts. Review of the MIP images confirms the above findings. CT ABDOMEN and PELVIS FINDINGS Hepatobiliary: Minimal cholelithiasis is noted. No abnormality is seen in the liver. Pancreas: Stable 3.3 cm cystic lesion seen arising  from pancreatic tail. No ductal dilatation or inflammation is noted. Spleen: Normal in size without focal abnormality. Adrenals/Urinary Tract: Adrenal glands appear normal. Stable bilateral renal cysts are noted. No hydronephrosis or renal obstruction is noted. No renal or ureteral calculi are noted. Urinary bladder is mildly distended, with air-fluid level present superiorly, suggesting recent instrumentation. Stomach/Bowel: Stomach is within normal limits. Appendix appears normal. No evidence of bowel wall thickening, distention, or inflammatory changes. Vascular/Lymphatic: Aortic atherosclerosis. No enlarged abdominal or pelvic lymph nodes. Reproductive: 7.1 x 4.7 cm lobulated and heterogeneously enhancing right adnexal mass is noted concerning for ovarian malignancy. Uterus and left ovary are unremarkable. Other: Moderate ascites is noted.  No definite hernia is noted. Musculoskeletal: No acute or significant osseous  findings. Review of the MIP images confirms the above findings. IMPRESSION: No definite evidence of pulmonary embolus. 7.1 x 4.4 cm lobulated right adnexal mass is noted concerning for ovarian malignancy. Moderate ascites is noted. Aortic atherosclerosis. Minimal cholelithiasis. Moderate size sliding-type hiatal hernia is noted. Moderate wall thickening of distal esophagus is noted concerning for inflammation or neoplasm. Endoscopy is recommended for further evaluation. Stable 3.3 cm cystic lesion arising from pancreatic tail. Coronary artery calcifications are noted suggesting coronary artery disease. Electronically Signed   By: Marijo Conception, M.D.   On: 11/20/2016 13:31   US Paracentesis  Result Date: 11/22/2016 INDICATION: Ascites EXAM: ULTRASOUND GUIDED PARACENTESIS MEDICATIONS: None. COMPLICATIONS: None immediate. PROCEDURE: Informed written consent was obtained from the patient after a discussion of the risks, benefits and alternatives to treatment. A timeout was performed prior to the initiation of the procedure. Initial ultrasound scanning demonstrates a large amount of ascites within the right lower abdominal quadrant. The right lower abdomen was prepped and draped in the usual sterile fashion. 1% lidocaine was used for local anesthesia. Following this, a 6 Fr Safe-T-Centesis catheter was introduced. An ultrasound image was saved for documentation purposes. The paracentesis was performed. The catheter was removed and a dressing was applied. The patient tolerated the procedure well without immediate post procedural complication. FINDINGS: A total of approximately 4.7 L of yellow fluid was removed. Samples were sent to the laboratory as requested by the clinical team. IMPRESSION: Successful ultrasound-guided paracentesis yielding 4.7 liters of peritoneal fluid. Electronically Signed   By: Inez Catalina M.D.   On: 11/22/2016 12:15     Assessment and plan- Patient is a 83 y.o. female who presents for  diarrhea X 2 days and small amount of bright red streaks of blood in stool. Labs were drawn indicating hyponatremia (131), elevated bilirubin (1.5) and slightly elevated kidney function (BUN 23). She is mildly tachycardic (109). BP stable.   1. Diarrhea: Spoke at length with the patient and the daughter about diarrhea and potential for severe dehydration if it continues. Patient states she thinks her diarrhea is getting better. Encouraged her to try OTC Imodium if the diarrhea does not subside. She was also given her 3 stool cards to return to [place] as she collects. 2. Blood in stool: Hemoccult cards given for patient to collect. Hemoglobin stable. Patient has a history of hemorrhoids. We'll continue to monitor this. 3. Hyponatremia/Dehydation: Offered patient IV hydration today given her hyponatremia and tachycardia. They declined for today but will call us tomorrow to give [place] an update on her diarrhea and hydration. Encouraged patient to drink plenty of fluids. Educated patient and daughter on appropriate liquids and supplements to help increase her hydration status. 4. Decline in appetite: RX Megace. Patient is hesitant to  take any additional medications. Megace was prescribed but she understands she does not have to take if she does not want to. She would like to try to increase her intake on her own. I educated her on the side effects of this medication.  5. RTC as scheduled to see Dr. Tasia Catchings for Cycle 2 of Carbo/Taxol and Avastin.    Visit Diagnosis 1. Diarrhea, unspecified type   2. Hematochezia   3. Malignant neoplasm of ovary, unspecified laterality [facility])     Patient expressed understanding and was in agreement with this plan. She also understands that She can call clinic at any time with any questions, concerns, or complaints.   Greater than 50% of this visit was spent on counseling/coordinating care for this patient.  Marisue Humble [facility] Pager-  [phone] 12/01/2016 10:20 AM

## 2016-11-30 NOTE — Telephone Encounter (Signed)
I went down and spoke with daughter in person and told her about lab appointment and that Sonia Baller would be speaking with then to see if she wants to be examined

## 2016-12-03 ENCOUNTER — Other Ambulatory Visit (INDEPENDENT_AMBULATORY_CARE_PROVIDER_SITE_OTHER): Payer: Self-pay | Admitting: Vascular Surgery

## 2016-12-08 MED ORDER — CEFAZOLIN SODIUM-DEXTROSE 2-4 GM/100ML-% IV SOLN
2.0000 g | Freq: Once | INTRAVENOUS | Status: AC
  Administered 2016-12-09: 2 g via INTRAVENOUS

## 2016-12-09 ENCOUNTER — Encounter
Admission: RE | Disposition: A | Discharge status: Home / Self Care | Payer: Self-pay | Source: Ambulatory Visit | Attending: Vascular Surgery

## 2016-12-09 ENCOUNTER — Ambulatory Visit
Admission: RE | Admit: 2016-12-09 | Discharge: 2016-12-09 | Disposition: A | Discharge status: Home / Self Care | Payer: Medicare Other | Source: Ambulatory Visit | Attending: Vascular Surgery | Admitting: Vascular Surgery

## 2016-12-09 DIAGNOSIS — C569 Malignant neoplasm of unspecified ovary: Secondary | ICD-10-CM | POA: Diagnosis not present

## 2016-12-09 DIAGNOSIS — Z882 Allergy status to sulfonamides status: Secondary | ICD-10-CM | POA: Diagnosis not present

## 2016-12-09 DIAGNOSIS — Z888 Allergy status to other drugs, medicaments and biological substances status: Secondary | ICD-10-CM | POA: Insufficient documentation

## 2016-12-09 DIAGNOSIS — Z8249 Family history of ischemic heart disease and other diseases of the circulatory system: Secondary | ICD-10-CM | POA: Diagnosis not present

## 2016-12-09 DIAGNOSIS — I10 Essential (primary) hypertension: Secondary | ICD-10-CM | POA: Diagnosis not present

## 2016-12-09 DIAGNOSIS — Z79899 Other long term (current) drug therapy: Secondary | ICD-10-CM | POA: Diagnosis not present

## 2016-12-09 DIAGNOSIS — Z9889 Other specified postprocedural states: Secondary | ICD-10-CM | POA: Diagnosis not present

## 2016-12-09 DIAGNOSIS — Z881 Allergy status to other antibiotic agents status: Secondary | ICD-10-CM | POA: Diagnosis not present

## 2016-12-09 HISTORY — PX: PORTA CATH INSERTION: CATH118285

## 2016-12-09 HISTORY — DX: Hyperlipidemia, unspecified: E78.5

## 2016-12-09 SURGERY — PORTA CATH INSERTION
Anesthesia: Moderate Sedation

## 2016-12-09 MED ORDER — MIDAZOLAM HCL 5 MG/5ML IJ SOLN
INTRAMUSCULAR | Status: AC
  Filled 2016-12-09: qty 5

## 2016-12-09 MED ORDER — SODIUM CHLORIDE 0.9 % IR SOLN
Freq: Once | Status: DC
  Filled 2016-12-09: qty 2

## 2016-12-09 MED ORDER — HEPARIN (PORCINE) IN NACL 2-0.9 UNIT/ML-% IJ SOLN
INTRAMUSCULAR | Status: AC
  Filled 2016-12-09: qty 500

## 2016-12-09 MED ORDER — HYDROMORPHONE HCL 1 MG/ML IJ SOLN: 1.0000 mg | Freq: Once | INTRAMUSCULAR | Status: DC | PRN

## 2016-12-09 MED ORDER — LIDOCAINE-EPINEPHRINE (PF) 1 %-1:200000 IJ SOLN
INTRAMUSCULAR | Status: AC
  Filled 2016-12-09: qty 30

## 2016-12-09 MED ORDER — SODIUM CHLORIDE 0.9 % IV SOLN
INTRAVENOUS | Status: DC
  Administered 2016-12-09: 1000 mL via INTRAVENOUS

## 2016-12-09 MED ORDER — ONDANSETRON HCL 4 MG/2ML IJ SOLN: 4.0000 mg | Freq: Four times a day (QID) | INTRAMUSCULAR | Status: DC | PRN

## 2016-12-09 MED ORDER — FENTANYL CITRATE (PF) 100 MCG/2ML IJ SOLN
INTRAMUSCULAR | Status: AC
  Filled 2016-12-09: qty 2

## 2016-12-09 MED ORDER — FENTANYL CITRATE (PF) 100 MCG/2ML IJ SOLN
INTRAMUSCULAR | Status: DC | PRN
Start: 2016-12-09 — End: 2016-12-09
  Administered 2016-12-09: 50 ug via INTRAVENOUS

## 2016-12-09 MED ORDER — MIDAZOLAM HCL 2 MG/2ML IJ SOLN
INTRAMUSCULAR | Status: DC | PRN
  Administered 2016-12-09: 2 mg via INTRAVENOUS

## 2016-12-09 SURGICAL SUPPLY — 11 items
DERMABOND ADVANCED (GAUZE/BANDAGES/DRESSINGS) ×1
DERMABOND ADVANCED .7 DNX12 (GAUZE/BANDAGES/DRESSINGS) ×1 IMPLANT
KIT PORT POWER 8FR ISP CVUE (Miscellaneous) ×2 IMPLANT
PACK ANGIOGRAPHY (CUSTOM PROCEDURE TRAY) ×2 IMPLANT
PAD GROUND ADULT SPLIT (MISCELLANEOUS) ×2 IMPLANT
PENCIL ELECTRO HAND CTR (MISCELLANEOUS) ×2 IMPLANT
PREP CHG 10.5 TEAL (MISCELLANEOUS) ×2 IMPLANT
SPONGE XRAY 4X4 16PLY STRL (MISCELLANEOUS) ×2 IMPLANT
SUT MNCRL AB 4-0 PS2 18 (SUTURE) ×2 IMPLANT
SUT PROLENE 0 CT 1 30 (SUTURE) ×2 IMPLANT
SUTURE VIC 3-0 (SUTURE) ×2 IMPLANT

## 2016-12-09 NOTE — Op Note (Signed)
       VEIN AND VASCULAR SURGERY       Operative Note  Date: 12/09/2016  Preoperative diagnosis:  1. Ovarian cancer  Postoperative diagnosis:  Same as above  Procedures: #1. Ultrasound guidance for vascular access to the right internal jugular vein. #2. Fluoroscopic guidance for placement of catheter. #3. Placement of CT compatible Port-A-Cath, right internal jugular vein.  Surgeon: Leotis Pain, MD.   Anesthesia: Local with moderate conscious sedation for approximately 15  minutes using 2 mg of Versed and 50 mcg of Fentanyl  Fluoroscopy time: less than 1 minute  Contrast used: 0  Estimated blood loss: 5 cc  Indication for the procedure:  The patient is a 83 y.o.female with ovarian cancer.  The patient needs a Port-A-Cath for durable venous access, chemotherapy, lab draws, and CT scans. We are asked to place this. Risks and benefits were discussed and informed consent was obtained.  Description of procedure: The patient was brought to the vascular and interventional radiology suite.  Moderate conscious sedation was administered throughout the procedure during a face to face encounter with the patient with my supervision of the RN administering medicines and monitoring the patient's vital signs, pulse oximetry, telemetry and mental status throughout from the start of the procedure until the patient was taken to the recovery room. The right neck chest and shoulder were sterilely prepped and draped, and a sterile surgical field was created. Ultrasound was used to help visualize a patent right internal jugular vein. This was then accessed under direct ultrasound guidance without difficulty with the Seldinger needle and a permanent image was recorded. A J-wire was placed. After skin nick and dilatation, the peel-away sheath was then placed over the wire. I then anesthetized an area under the clavicle approximately 1-2 fingerbreadths. A transverse incision was created and an inferior pocket  was created with electrocautery and blunt dissection. The port was then brought onto the field, placed into the pocket and secured to the chest wall with 2 Prolene sutures. The catheter was connected to the port and tunneled from the subclavicular incision to the access site. Fluoroscopic guidance was then used to cut the catheter to an appropriate length. The catheter was then placed through the peel-away sheath and the peel-away sheath was removed. The catheter tip was parked in excellent location under fluorocoscopic guidance in the SVC just above the right atrium. The pocket was then irrigated with antibiotic impregnated saline and the wound was closed with a running 3-0 Vicryl and a 4-0 Monocryl. The access incision was closed with a single 4-0 Monocryl. The Huber needle was used to withdraw blood and flush the port with heparinized saline. Dermabond was then placed as a dressing. The patient tolerated the procedure well and was taken to the recovery room in stable condition.   Leotis Pain 12/09/2016 10:20 AM   This note was created with Dragon Medical transcription system. Any errors in dictation are purely unintentional.

## 2016-12-09 NOTE — H&P (Signed)
[facility] History & Physical Update  The patient was interviewed and re-examined.  The patient's previous History and Physical has been reviewed and is unchanged.  There is no change in the plan of care. We plan to proceed with the scheduled procedure.  Leotis Pain, MD  12/09/2016, 9:41 AM

## 2016-12-10 ENCOUNTER — Encounter: Payer: Self-pay | Admitting: Vascular Surgery

## 2016-12-16 NOTE — Progress Notes (Signed)
Hematology/Oncology Follow Up Visit [facility] Telephone:([phone] [phone]   Patient Care Team: Adin Hector, MD as PCP - General (Internal Medicine) Clent Jacks, RN as Registered Nurse  REASON FOR VISIT Follow up for treatment of recently diagnosed ovarian cancer.   HISTORY OF PRESENTING ILLNESS:  Ebony Navarro is a  83 y.o.  female with PMH listed below who who presented as a follow-up of his recent hospitalization due to malignant ascites.This is a 83 year old female who has a remote history breast cancer x2, status post surgery and adjuvant chemotherapy(likely AC), hypertension, thyroid disease presented with 2 weeks of increased abdominal distention/discomfort/dyspepsia and shortness of breath. Patient was found to have large amount of ascites. CT abdomen and pelvis with contrast showed 7.1 x 4.4 cm lobulated right ovarian mass concerning for ovarian malignancy. There is also moderate ascites. There is also moderate wall thickening of distal esophagus plus concerning for inflammation or malignancy. Patient reports that she has been her usual health until couple of weeks ago her abdomen started to be more distended with worsening of abdomen discomfort.she is also belching forcefully and with frequency.CA125 is 2,658 Ultrasound-guided paracentesis was done during her paracentesis, one of 5 L fluid was removed and the cytology was sent. Pathology reveals positive for malignancy, metastatic adenocarcinoma consistent with gynecologic origin.  # Patient was seen by GYN oncology Dr. Limmie Patricia for evaluation and was recommended to start neoadjuvant chemotherapy with carboplatinum AUC of 6, and Taxol 1 75 mg/m x 4 cycles. Due to the large amount of ascites, was suggested to also start on Avastin for 2 cycles. Patient will be evaluated again by GYN onc for possible debulking surgery. #Patient's left breast cancer was in 2002, treatment including  lumpectomy, chemotherapy and radiation. She also developed right side breast cancer in 2005 status post lumpectomy and radiation. She has been referred to genetic counselor Ofri for genetic testing.   Today she presents to follow up prior to chemotherapy. Doing better, less fatigued, abdominal distention improved. Appetite is better. Noticed cloudy urine. Denies dysuria or increased frequency.   Review of Systems  Constitutional: Negative for fever, night sweats, positive for decreased in appetite.+ weight loss HENT: Negative for ear pain, hearing loss, nasal bleeding Eyes: Negative for eye pain, double vision   Respiratory: Negative for wheezing, shortness of breath, cough Cardiovascular: Negative for chest pain, palpitation.   Gastrointestinal: Negative abdominal pain, diarrhea, nausea vomiting, belching and abdomen distention has improved.  Endocrine: Negative  Genitourinary: Negative for dysuria, hematuria, frequency. Cloudy urine Skin: Negative for rash, iching, bruising Neurological: Negative for headache, dizziness, seizure Hematological: Negative for easy bruising/bleeding, lymph node enlargement Psychiatric/Behavioral: Negative for depression, anxiety, suicidality  MEDICAL HISTORY:  Past Medical History:  Diagnosis Date  . Breast cancer ([place]) 2005   Right, radiation and lumpectomy  . Breast cancer ([place]) 2002   Left, Chemo, radiation and lumpectomy  . Cancer ([place])   . Hyperlipidemia   . Hypertension   . Ovarian cancer ([place]) 11/24/2016    SURGICAL HISTORY: Past Surgical History:  Procedure Laterality Date  . BREAST BIOPSY Left 2010   negative stereotactic biopsy  . BREAST LUMPECTOMY Right 2005   positive  . BREAST LUMPECTOMY Left 2002   positive    SOCIAL HISTORY: Social History   Socioeconomic History  . Marital status: Widowed    Spouse name: Not on file  . Number of children: Not on file  . Years of education: Not on file  .  Highest education level: Not on  file  Social Needs  . Financial resource strain: Not on file  . Food insecurity - worry: Not on file  . Food insecurity - inability: Not on file  . Transportation needs - medical: Not on file  . Transportation needs - non-medical: Not on file  Occupational History  . Not on file  Tobacco Use  . Smoking status: Never Smoker  . Smokeless tobacco: Never Used  Substance and Sexual Activity  . Alcohol use: No  . Drug use: No  . Sexual activity: No    Birth control/protection: Post-menopausal  Other Topics Concern  . Not on file  Social History Narrative  . Not on file    FAMILY HISTORY: Family History  Problem Relation Age of Onset  . Hypertension Mother   . Hypertension Father     ALLERGIES:  is allergic to ace inhibitors; atenolol; iodine; metoprolol tartrate; omeprazole; povidone-iodine; rofecoxib; ciprofloxacin; doxycycline calcium; nitrofurantoin; quinolones; and sulfa antibiotics.  MEDICATIONS:  Current Outpatient Medications  Medication Sig Dispense Refill  . atorvastatin (LIPITOR) 40 MG tablet Take 40 mg by mouth daily.     Marland Kitchen diltiazem (CARDIZEM CD) 120 MG 24 hr capsule TAKE 120 MG BY MOUTH EVERY OTHER DAY    . levothyroxine (SYNTHROID, LEVOTHROID) 50 MCG tablet TAKE 1 TABLET BY MOUTH DAILY ON AN EMPTY STOMACH WITH A GLASS OF WATER 30-60 MIN BEFORE BREAKFAST    . lidocaine-prilocaine (EMLA) cream Apply to affected area once 30 g 3  . LORazepam (ATIVAN) 0.5 MG tablet Take 1 tablet (0.5 mg total) by mouth every 6 (six) hours as needed (Nausea or vomiting). 30 tablet 0  . megestrol (MEGACE) 20 MG tablet Take 1 tablet (20 mg total) by mouth daily. 30 tablet 0  . ondansetron (ZOFRAN) 8 MG tablet Take 1 tablet (8 mg total) by mouth 2 (two) times daily as needed for refractory nausea / vomiting. Start on day 3 after chemo. 30 tablet 1  . prochlorperazine (COMPAZINE) 10 MG tablet Take 1 tablet (10 mg total) by mouth every 6 (six) hours as needed (Nausea or vomiting). 30 tablet 1    . promethazine (PHENERGAN) 25 MG tablet Take 1 tablet (25 mg total) by mouth every 6 (six) hours as needed for nausea or vomiting. 20 tablet 0   No current facility-administered medications for this visit.       Marland Kitchen  PHYSICAL EXAMINATION: ECOG PERFORMANCE STATUS: 1 - Symptomatic but completely ambulatory Vitals:   12/17/16 0902  BP: 112/74  Pulse: 98  Temp: 98.2 F (36.8 C)   Filed Weights   12/17/16 0902  Weight: 154 lb 9 oz (70.1 kg)   GENERAL: No distress, well nourished.  SKIN:  No rashes or significant lesions  HEAD: Normocephalic, No masses, lesions, tenderness or abnormalities  EYES: Conjunctiva are pink, non icteric ENT: External ears normal ,lips , buccal mucosa, and tongue normal and mucous membranes are moist  LYMPH: No palpable cervical and axillary lymphadenopathy  LUNGS: Clear to auscultation, no crackles or wheezes HEART: Regular rate & rhythm, no murmurs, no gallops, S1 normal and S2 normal  ABDOMEN: bdomen soft, mildly distended nontender, normal bowel sounds, I did not appreciate any  masses or organomegaly  MUSCULOSKELETAL: No CVA tenderness and no tenderness on percussion of the back or rib cage.  EXTREMITIES: No edema, no skin discoloration or tenderness NEURO: Alert & oriented, no focal motor/sensory deficits.  LABORATORY DATA:  I have reviewed the data as listed Lab Results  Component Value Date   WBC 6.3 11/30/2016   HGB 14.0 11/30/2016   HCT 42.9 11/30/2016   MCV 92.4 11/30/2016   PLT 278 11/30/2016   Recent Labs    11/21/16 0454 11/22/16 0418 11/26/16 0847 11/30/16 1200  NA 137 139 137 131*  K 4.1 3.9 3.9 4.1  CL 102 106 102 95*  CO2 25 25 25  21*  GLUCOSE 105* 113* 142* 115*  BUN 17 16 10  23*  CREATININE 1.07* 0.87 0.89 0.96  CALCIUM 8.2* 8.1* 8.8* 9.1  GFRNONAA 47* >60 59* 54*  GFRAA 55* >60 >60 >60  PROT 6.5  --  7.2 8.6*  ALBUMIN 2.8*  --  3.0* 3.9  AST 29  --  42* 55*  ALT 13*  --  24 40  ALKPHOS 58  --  66 77  BILITOT 1.2   --  0.7 1.5*     ASSESSMENT & PLAN:  1. Malignant neoplasm of ovary, unspecified laterality ([place])   2. Malignant ascites   3. History of breast cancer   4. Encounter for antineoplastic chemotherapy    # OK to proceed to cycle 2 Carboplatin + Taxol + Avastin.   #Recommend patient getting genetic testing, has referred her to genetic counselor  All questions were answered. The patient knows to call the clinic with any problems questions or concerns.  Return of visit:  3 weeks on day 1 of cycle 3 carbo, Taxol,  with CBC, CMP, UA done prior to the visit.  Thank you for this kind referral and the opportunity to participate in the care of this patient. A copy of today's note is routed to referring provider    Earlie Server, MD, PhD Hematology Oncology [facility] Pager- [phone] 12/16/2016

## 2016-12-17 ENCOUNTER — Inpatient Hospital Stay: Payer: Medicare Other

## 2016-12-17 ENCOUNTER — Other Ambulatory Visit: Payer: Self-pay

## 2016-12-17 ENCOUNTER — Inpatient Hospital Stay: Payer: Medicare Other | Attending: Oncology

## 2016-12-17 ENCOUNTER — Encounter: Payer: Self-pay | Admitting: Oncology

## 2016-12-17 ENCOUNTER — Inpatient Hospital Stay (HOSPITAL_BASED_OUTPATIENT_CLINIC_OR_DEPARTMENT_OTHER): Payer: Medicare Other | Admitting: Oncology

## 2016-12-17 VITALS — BP 116/72 | HR 88 | Resp 20

## 2016-12-17 VITALS — BP 112/74 | HR 98 | Temp 98.2°F | Wt 154.6 lb

## 2016-12-17 DIAGNOSIS — Z853 Personal history of malignant neoplasm of breast: Secondary | ICD-10-CM | POA: Diagnosis not present

## 2016-12-17 DIAGNOSIS — R180 Malignant ascites: Secondary | ICD-10-CM

## 2016-12-17 DIAGNOSIS — Z79899 Other long term (current) drug therapy: Secondary | ICD-10-CM

## 2016-12-17 DIAGNOSIS — Z5111 Encounter for antineoplastic chemotherapy: Secondary | ICD-10-CM

## 2016-12-17 DIAGNOSIS — C569 Malignant neoplasm of unspecified ovary: Secondary | ICD-10-CM

## 2016-12-17 DIAGNOSIS — Z923 Personal history of irradiation: Secondary | ICD-10-CM | POA: Insufficient documentation

## 2016-12-17 DIAGNOSIS — I10 Essential (primary) hypertension: Secondary | ICD-10-CM | POA: Insufficient documentation

## 2016-12-17 DIAGNOSIS — E785 Hyperlipidemia, unspecified: Secondary | ICD-10-CM | POA: Insufficient documentation

## 2016-12-17 LAB — URINALYSIS, COMPLETE (UACMP) WITH MICROSCOPIC
BILIRUBIN URINE: NEGATIVE
Bacteria, UA: NONE SEEN
Glucose, UA: NEGATIVE mg/dL
Hgb urine dipstick: NEGATIVE
Ketones, ur: NEGATIVE mg/dL
NITRITE: NEGATIVE
PH: 6 (ref 5.0–8.0)
Protein, ur: 100 mg/dL — AB
SPECIFIC GRAVITY, URINE: 1.013 (ref 1.005–1.030)
SQUAMOUS EPITHELIAL / LPF: NONE SEEN

## 2016-12-17 LAB — CBC WITH DIFFERENTIAL/PLATELET
Basophils Absolute: 0 10*3/uL (ref 0–0.1)
Basophils Relative: 1 %
EOS ABS: 0 10*3/uL (ref 0–0.7)
Eosinophils Relative: 1 %
HCT: 35.5 % (ref 35.0–47.0)
HEMOGLOBIN: 12.1 g/dL (ref 12.0–16.0)
LYMPHS ABS: 1.2 10*3/uL (ref 1.0–3.6)
Lymphocytes Relative: 24 %
MCH: 31.1 pg (ref 26.0–34.0)
MCHC: 34 g/dL (ref 32.0–36.0)
MCV: 91.4 fL (ref 80.0–100.0)
MONOS PCT: 9 %
Monocytes Absolute: 0.5 10*3/uL (ref 0.2–0.9)
NEUTROS ABS: 3.4 10*3/uL (ref 1.4–6.5)
NEUTROS PCT: 65 %
Platelets: 234 10*3/uL (ref 150–440)
RBC: 3.89 MIL/uL (ref 3.80–5.20)
RDW: 15 % — ABNORMAL HIGH (ref 11.5–14.5)
WBC: 5.2 10*3/uL (ref 3.6–11.0)

## 2016-12-17 LAB — COMPREHENSIVE METABOLIC PANEL
ALK PHOS: 72 U/L (ref 38–126)
ALT: 14 U/L (ref 14–54)
ANION GAP: 8 (ref 5–15)
AST: 27 U/L (ref 15–41)
Albumin: 3.6 g/dL (ref 3.5–5.0)
BILIRUBIN TOTAL: 0.9 mg/dL (ref 0.3–1.2)
BUN: 29 mg/dL — ABNORMAL HIGH (ref 6–20)
CALCIUM: 9.1 mg/dL (ref 8.9–10.3)
CO2: 23 mmol/L (ref 22–32)
Chloride: 103 mmol/L (ref 101–111)
Creatinine, Ser: 0.96 mg/dL (ref 0.44–1.00)
GFR, EST NON AFRICAN AMERICAN: 54 mL/min — AB (ref >60)
Glucose, Bld: 146 mg/dL — ABNORMAL HIGH (ref 65–99)
Potassium: 4.4 mmol/L (ref 3.5–5.1)
Sodium: 134 mmol/L — ABNORMAL LOW (ref 135–145)
TOTAL PROTEIN: 7.7 g/dL (ref 6.5–8.1)

## 2016-12-17 MED ORDER — SODIUM CHLORIDE 0.9 % IV SOLN
1000.0000 mg | Freq: Once | INTRAVENOUS | Status: AC
  Administered 2016-12-17: 1000 mg via INTRAVENOUS
  Filled 2016-12-17: qty 32

## 2016-12-17 MED ORDER — SODIUM CHLORIDE 0.9 % IV SOLN
20.0000 mg | Freq: Once | INTRAVENOUS | Status: AC
  Administered 2016-12-17: 20 mg via INTRAVENOUS
  Filled 2016-12-17: qty 2

## 2016-12-17 MED ORDER — SODIUM CHLORIDE 0.9 % IV SOLN
Freq: Once | INTRAVENOUS | Status: AC
  Administered 2016-12-17: 10:00:00 via INTRAVENOUS
  Filled 2016-12-17: qty 1000

## 2016-12-17 MED ORDER — DIPHENHYDRAMINE HCL 50 MG/ML IJ SOLN
50.0000 mg | Freq: Once | INTRAMUSCULAR | Status: AC
  Administered 2016-12-17: 50 mg via INTRAVENOUS
  Filled 2016-12-17: qty 1

## 2016-12-17 MED ORDER — HEPARIN SOD (PORK) LOCK FLUSH 100 UNIT/ML IV SOLN
500.0000 [IU] | Freq: Once | INTRAVENOUS | Status: AC | PRN
  Administered 2016-12-17: 500 [IU]

## 2016-12-17 MED ORDER — SODIUM CHLORIDE 0.9 % IV SOLN
342.0000 mg | Freq: Once | INTRAVENOUS | Status: AC
Start: 2016-12-17 — End: 2016-12-17
  Administered 2016-12-17: 342 mg via INTRAVENOUS
  Filled 2016-12-17: qty 57

## 2016-12-17 MED ORDER — PACLITAXEL CHEMO INJECTION 300 MG/50ML: 175.0000 mg/m2 | Freq: Once | INTRAVENOUS | Status: DC

## 2016-12-17 MED ORDER — SODIUM CHLORIDE 0.9 % IV SOLN
481.2000 mg | Freq: Once | INTRAVENOUS | Status: AC
  Administered 2016-12-17: 480 mg via INTRAVENOUS
  Filled 2016-12-17: qty 48

## 2016-12-17 MED ORDER — PALONOSETRON HCL INJECTION 0.25 MG/5ML
0.2500 mg | Freq: Once | INTRAVENOUS | Status: AC
  Administered 2016-12-17: 0.25 mg via INTRAVENOUS
  Filled 2016-12-17: qty 5

## 2016-12-17 MED ORDER — FAMOTIDINE IN NACL 20-0.9 MG/50ML-% IV SOLN
20.0000 mg | Freq: Once | INTRAVENOUS | Status: AC
  Administered 2016-12-17: 20 mg via INTRAVENOUS
  Filled 2016-12-17: qty 50

## 2016-12-17 NOTE — Progress Notes (Signed)
Avastin dose has been 1200mg . Wt is now 70.1kg and calculated dose is now 1050mg  which is more than 10% difference. Dose was changed to 1000mg .

## 2016-12-17 NOTE — Progress Notes (Signed)
Patient here today for follow up.  Lab called with concerns about the looks of her urine sample, very cloudy with a orange color to it

## 2016-12-18 LAB — CA 125: Cancer Antigen (CA) 125: 423.5 U/mL — ABNORMAL HIGH (ref 0.0–38.1)

## 2016-12-19 ENCOUNTER — Emergency Department: Payer: Medicare Other

## 2016-12-19 ENCOUNTER — Encounter: Payer: Self-pay | Admitting: Emergency Medicine

## 2016-12-19 ENCOUNTER — Inpatient Hospital Stay
Admission: EM | Admit: 2016-12-19 | Discharge: 2017-01-01 | DRG: 280 | Disposition: A | Discharge status: Skilled Nursing Facility | Payer: Medicare Other | Attending: Internal Medicine | Admitting: Internal Medicine

## 2016-12-19 ENCOUNTER — Other Ambulatory Visit: Payer: Self-pay

## 2016-12-19 DIAGNOSIS — Z881 Allergy status to other antibiotic agents status: Secondary | ICD-10-CM | POA: Diagnosis not present

## 2016-12-19 DIAGNOSIS — Z853 Personal history of malignant neoplasm of breast: Secondary | ICD-10-CM | POA: Diagnosis not present

## 2016-12-19 DIAGNOSIS — J9601 Acute respiratory failure with hypoxia: Secondary | ICD-10-CM | POA: Diagnosis present

## 2016-12-19 DIAGNOSIS — C786 Secondary malignant neoplasm of retroperitoneum and peritoneum: Secondary | ICD-10-CM | POA: Diagnosis present

## 2016-12-19 DIAGNOSIS — J690 Pneumonitis due to inhalation of food and vomit: Secondary | ICD-10-CM | POA: Diagnosis present

## 2016-12-19 DIAGNOSIS — I350 Nonrheumatic aortic (valve) stenosis: Secondary | ICD-10-CM | POA: Diagnosis present

## 2016-12-19 DIAGNOSIS — K922 Gastrointestinal hemorrhage, unspecified: Secondary | ICD-10-CM

## 2016-12-19 DIAGNOSIS — Z888 Allergy status to other drugs, medicaments and biological substances status: Secondary | ICD-10-CM

## 2016-12-19 DIAGNOSIS — D702 Other drug-induced agranulocytosis: Secondary | ICD-10-CM

## 2016-12-19 DIAGNOSIS — Z882 Allergy status to sulfonamides status: Secondary | ICD-10-CM

## 2016-12-19 DIAGNOSIS — Z79890 Hormone replacement therapy (postmenopausal): Secondary | ICD-10-CM

## 2016-12-19 DIAGNOSIS — I959 Hypotension, unspecified: Secondary | ICD-10-CM | POA: Diagnosis not present

## 2016-12-19 DIAGNOSIS — N179 Acute kidney failure, unspecified: Secondary | ICD-10-CM | POA: Diagnosis present

## 2016-12-19 DIAGNOSIS — Z66 Do not resuscitate: Secondary | ICD-10-CM | POA: Diagnosis not present

## 2016-12-19 DIAGNOSIS — E785 Hyperlipidemia, unspecified: Secondary | ICD-10-CM | POA: Diagnosis present

## 2016-12-19 DIAGNOSIS — Z9221 Personal history of antineoplastic chemotherapy: Secondary | ICD-10-CM | POA: Diagnosis not present

## 2016-12-19 DIAGNOSIS — E039 Hypothyroidism, unspecified: Secondary | ICD-10-CM | POA: Diagnosis present

## 2016-12-19 DIAGNOSIS — I110 Hypertensive heart disease with heart failure: Secondary | ICD-10-CM | POA: Diagnosis present

## 2016-12-19 DIAGNOSIS — D61818 Other pancytopenia: Secondary | ICD-10-CM | POA: Diagnosis present

## 2016-12-19 DIAGNOSIS — I5021 Acute systolic (congestive) heart failure: Secondary | ICD-10-CM | POA: Diagnosis present

## 2016-12-19 DIAGNOSIS — D649 Anemia, unspecified: Secondary | ICD-10-CM | POA: Diagnosis not present

## 2016-12-19 DIAGNOSIS — Z79899 Other long term (current) drug therapy: Secondary | ICD-10-CM | POA: Diagnosis not present

## 2016-12-19 DIAGNOSIS — K920 Hematemesis: Secondary | ICD-10-CM | POA: Diagnosis present

## 2016-12-19 DIAGNOSIS — I4891 Unspecified atrial fibrillation: Secondary | ICD-10-CM | POA: Diagnosis present

## 2016-12-19 DIAGNOSIS — J810 Acute pulmonary edema: Secondary | ICD-10-CM | POA: Diagnosis not present

## 2016-12-19 DIAGNOSIS — J9600 Acute respiratory failure, unspecified whether with hypoxia or hypercapnia: Secondary | ICD-10-CM

## 2016-12-19 DIAGNOSIS — J811 Chronic pulmonary edema: Secondary | ICD-10-CM | POA: Diagnosis not present

## 2016-12-19 DIAGNOSIS — I480 Paroxysmal atrial fibrillation: Secondary | ICD-10-CM | POA: Diagnosis not present

## 2016-12-19 DIAGNOSIS — Z7189 Other specified counseling: Secondary | ICD-10-CM | POA: Diagnosis not present

## 2016-12-19 DIAGNOSIS — Z923 Personal history of irradiation: Secondary | ICD-10-CM

## 2016-12-19 DIAGNOSIS — Z7952 Long term (current) use of systemic steroids: Secondary | ICD-10-CM

## 2016-12-19 DIAGNOSIS — R0602 Shortness of breath: Secondary | ICD-10-CM

## 2016-12-19 DIAGNOSIS — R180 Malignant ascites: Secondary | ICD-10-CM | POA: Diagnosis not present

## 2016-12-19 DIAGNOSIS — D696 Thrombocytopenia, unspecified: Secondary | ICD-10-CM | POA: Diagnosis not present

## 2016-12-19 DIAGNOSIS — I10 Essential (primary) hypertension: Secondary | ICD-10-CM | POA: Diagnosis not present

## 2016-12-19 DIAGNOSIS — C561 Malignant neoplasm of right ovary: Secondary | ICD-10-CM | POA: Diagnosis present

## 2016-12-19 DIAGNOSIS — D709 Neutropenia, unspecified: Secondary | ICD-10-CM | POA: Diagnosis not present

## 2016-12-19 DIAGNOSIS — C569 Malignant neoplasm of unspecified ovary: Secondary | ICD-10-CM | POA: Diagnosis not present

## 2016-12-19 DIAGNOSIS — I214 Non-ST elevation (NSTEMI) myocardial infarction: Secondary | ICD-10-CM | POA: Diagnosis present

## 2016-12-19 LAB — HEMOGLOBIN: HEMOGLOBIN: 12.1 g/dL (ref 12.0–16.0)

## 2016-12-19 LAB — CBC
HCT: 34.2 % — ABNORMAL LOW (ref 35.0–47.0)
HEMATOCRIT: 37.3 % (ref 35.0–47.0)
HEMOGLOBIN: 11.1 g/dL — AB (ref 12.0–16.0)
HEMOGLOBIN: 12.1 g/dL (ref 12.0–16.0)
MCH: 30.3 pg (ref 26.0–34.0)
MCH: 30.2 pg (ref 26.0–34.0)
MCHC: 32.5 g/dL (ref 32.0–36.0)
MCHC: 32.4 g/dL (ref 32.0–36.0)
MCV: 93.3 fL (ref 80.0–100.0)
MCV: 93.1 fL (ref 80.0–100.0)
PLATELETS: 219 10*3/uL (ref 150–440)
Platelets: 228 10*3/uL (ref 150–440)
RBC: 3.66 MIL/uL — AB (ref 3.80–5.20)
RBC: 4 MIL/uL (ref 3.80–5.20)
RDW: 15.3 % — ABNORMAL HIGH (ref 11.5–14.5)
RDW: 15.3 % — AB (ref 11.5–14.5)
WBC: 11.7 10*3/uL — AB (ref 3.6–11.0)
WBC: 11.8 10*3/uL — ABNORMAL HIGH (ref 3.6–11.0)

## 2016-12-19 LAB — BASIC METABOLIC PANEL
ANION GAP: 9 (ref 5–15)
Anion gap: 10 (ref 5–15)
BUN: 40 mg/dL — AB (ref 6–20)
BUN: 43 mg/dL — AB (ref 6–20)
CHLORIDE: 104 mmol/L (ref 101–111)
CO2: 20 mmol/L — AB (ref 22–32)
CO2: 20 mmol/L — AB (ref 22–32)
Calcium: 7.4 mg/dL — ABNORMAL LOW (ref 8.9–10.3)
Calcium: 8.4 mg/dL — ABNORMAL LOW (ref 8.9–10.3)
Chloride: 106 mmol/L (ref 101–111)
Creatinine, Ser: 0.92 mg/dL (ref 0.44–1.00)
Creatinine, Ser: 1.03 mg/dL — ABNORMAL HIGH (ref 0.44–1.00)
GFR calc Af Amer: >60 mL/min (ref >60)
GFR calc Af Amer: 57 mL/min — ABNORMAL LOW (ref >60)
GFR calc non Af Amer: 57 mL/min — ABNORMAL LOW (ref >60)
GFR calc non Af Amer: 50 mL/min — ABNORMAL LOW (ref >60)
GLUCOSE: 161 mg/dL — AB (ref 65–99)
Glucose, Bld: 144 mg/dL — ABNORMAL HIGH (ref 65–99)
POTASSIUM: 4 mmol/L (ref 3.5–5.1)
POTASSIUM: 4.7 mmol/L (ref 3.5–5.1)
Sodium: 136 mmol/L (ref 135–145)
Sodium: 133 mmol/L — ABNORMAL LOW (ref 135–145)

## 2016-12-19 LAB — LIPID PANEL
Cholesterol: 154 mg/dL (ref 0–200)
HDL: 52 mg/dL (ref >40)
LDL CALC: 82 mg/dL (ref 0–99)
TRIGLYCERIDES: 101 mg/dL (ref <150)
Total CHOL/HDL Ratio: 3 RATIO
VLDL: 20 mg/dL (ref 0–40)

## 2016-12-19 LAB — PROTIME-INR
INR: 1.12
Prothrombin Time: 14.3 seconds (ref 11.4–15.2)

## 2016-12-19 LAB — TROPONIN I
TROPONIN I: 3.16 ng/mL — AB (ref <0.03)
TROPONIN I: 13.33 ng/mL — AB (ref <0.03)
Troponin I: 6.72 ng/mL (ref <0.03)
Troponin I: 10.05 ng/mL (ref <0.03)

## 2016-12-19 LAB — APTT: APTT: 29 s (ref 24–36)

## 2016-12-19 MED ORDER — MEGESTROL ACETATE 20 MG PO TABS
20.0000 mg | ORAL_TABLET | Freq: Every day | ORAL | Status: DC
  Administered 2016-12-19: 20 mg via ORAL
  Filled 2016-12-19 (×2): qty 1

## 2016-12-19 MED ORDER — NITROGLYCERIN 2 % TD OINT
1.0000 [in_us] | TOPICAL_OINTMENT | Freq: Four times a day (QID) | TRANSDERMAL | Status: DC
  Administered 2016-12-19 (×3): 1 [in_us] via TOPICAL
  Filled 2016-12-19 (×4): qty 1

## 2016-12-19 MED ORDER — MAGNESIUM SULFATE 2 GM/50ML IV SOLN
2.0000 g | Freq: Once | INTRAVENOUS | Status: AC
  Administered 2016-12-19: 2 g via INTRAVENOUS
  Filled 2016-12-19: qty 50

## 2016-12-19 MED ORDER — DILTIAZEM HCL ER COATED BEADS 120 MG PO CP24
120.0000 mg | ORAL_CAPSULE | Freq: Every day | ORAL | Status: DC
  Administered 2016-12-19: 120 mg via ORAL
  Filled 2016-12-19: qty 1

## 2016-12-19 MED ORDER — NITROGLYCERIN 0.4 MG SL SUBL: 0.4000 mg | SUBLINGUAL_TABLET | SUBLINGUAL | Status: DC | PRN

## 2016-12-19 MED ORDER — PANTOPRAZOLE SODIUM 40 MG IV SOLR: 40.0000 mg | Freq: Two times a day (BID) | INTRAVENOUS | Status: DC

## 2016-12-19 MED ORDER — SODIUM CHLORIDE 0.9% FLUSH
3.0000 mL | Freq: Two times a day (BID) | INTRAVENOUS | Status: DC
  Administered 2016-12-19 – 2017-01-01 (×25): 3 mL via INTRAVENOUS

## 2016-12-19 MED ORDER — ATORVASTATIN CALCIUM 20 MG PO TABS: 40.0000 mg | ORAL_TABLET | Freq: Every day | ORAL | Status: DC

## 2016-12-19 MED ORDER — ASPIRIN EC 81 MG PO TBEC: 81.0000 mg | DELAYED_RELEASE_TABLET | Freq: Every day | ORAL | Status: DC

## 2016-12-19 MED ORDER — SODIUM CHLORIDE 0.9% FLUSH
3.0000 mL | INTRAVENOUS | Status: DC | PRN
  Administered 2016-12-27: 3 mL via INTRAVENOUS
  Filled 2016-12-19: qty 3

## 2016-12-19 MED ORDER — HEPARIN BOLUS VIA INFUSION
4000.0000 [IU] | Freq: Once | INTRAVENOUS | Status: DC
  Filled 2016-12-19: qty 4000

## 2016-12-19 MED ORDER — HEPARIN (PORCINE) IN NACL 100-0.45 UNIT/ML-% IJ SOLN
850.0000 [IU]/h | INTRAMUSCULAR | Status: DC
  Administered 2016-12-19: 850 [IU]/h via INTRAVENOUS
  Filled 2016-12-19: qty 250

## 2016-12-19 MED ORDER — PENTAFLUOROPROP-TETRAFLUOROETH EX AERO
INHALATION_SPRAY | CUTANEOUS | Status: AC
  Administered 2016-12-19: 02:00:00
  Filled 2016-12-19: qty 30

## 2016-12-19 MED ORDER — ONDANSETRON HCL 4 MG/2ML IJ SOLN
4.0000 mg | Freq: Four times a day (QID) | INTRAMUSCULAR | Status: DC | PRN
  Administered 2016-12-19 – 2016-12-22 (×3): 4 mg via INTRAVENOUS
  Filled 2016-12-19 (×3): qty 2

## 2016-12-19 MED ORDER — ASPIRIN 81 MG PO CHEW
324.0000 mg | CHEWABLE_TABLET | Freq: Once | ORAL | Status: AC
Start: 2016-12-19 — End: 2016-12-19
  Administered 2016-12-19: 324 mg via ORAL
  Filled 2016-12-19: qty 4

## 2016-12-19 MED ORDER — ATORVASTATIN CALCIUM 20 MG PO TABS
40.0000 mg | ORAL_TABLET | Freq: Every day | ORAL | Status: DC
  Administered 2016-12-19 – 2017-01-01 (×14): 40 mg via ORAL
  Filled 2016-12-19 (×14): qty 2

## 2016-12-19 MED ORDER — MORPHINE SULFATE (PF) 2 MG/ML IV SOLN
2.0000 mg | INTRAVENOUS | Status: DC | PRN
  Administered 2016-12-21: 1 mg via INTRAVENOUS
  Administered 2016-12-25 – 2016-12-29 (×6): 2 mg via INTRAVENOUS
  Filled 2016-12-19 (×7): qty 1

## 2016-12-19 MED ORDER — HEPARIN BOLUS VIA INFUSION
4000.0000 [IU] | Freq: Once | INTRAVENOUS | Status: AC
  Administered 2016-12-19: 4000 [IU] via INTRAVENOUS
  Filled 2016-12-19: qty 4000

## 2016-12-19 MED ORDER — METOPROLOL TARTRATE 25 MG PO TABS
12.5000 mg | ORAL_TABLET | Freq: Two times a day (BID) | ORAL | Status: DC
  Administered 2016-12-19: 12.5 mg via ORAL
  Administered 2016-12-19: 11:00:00 via ORAL
  Administered 2016-12-20: 12.5 mg via ORAL
  Filled 2016-12-19 (×3): qty 1

## 2016-12-19 MED ORDER — SODIUM CHLORIDE 0.9 % IV SOLN
8.0000 mg/h | INTRAVENOUS | Status: DC
  Administered 2016-12-19 – 2016-12-20 (×3): 8 mg/h via INTRAVENOUS
  Filled 2016-12-19 (×3): qty 80

## 2016-12-19 MED ORDER — ONDANSETRON HCL 4 MG/2ML IJ SOLN
INTRAMUSCULAR | Status: AC
  Administered 2016-12-19: 4 mg
  Filled 2016-12-19: qty 2

## 2016-12-19 MED ORDER — SODIUM CHLORIDE 0.9 % IV SOLN
INTRAVENOUS | Status: DC
  Administered 2016-12-19: 15:00:00 via INTRAVENOUS

## 2016-12-19 MED ORDER — ASPIRIN 300 MG RE SUPP: 300.0000 mg | RECTAL | Status: DC

## 2016-12-19 MED ORDER — ACETAMINOPHEN 325 MG PO TABS: 650.0000 mg | ORAL_TABLET | ORAL | Status: DC | PRN

## 2016-12-19 MED ORDER — LEVOTHYROXINE SODIUM 50 MCG PO TABS
50.0000 ug | ORAL_TABLET | Freq: Every day | ORAL | Status: DC
  Administered 2016-12-19 – 2017-01-01 (×14): 50 ug via ORAL
  Filled 2016-12-19 (×14): qty 1

## 2016-12-19 MED ORDER — DEXTROSE 5 % IV SOLN
2.0000 g | Freq: Once | INTRAVENOUS | Status: DC
  Filled 2016-12-19: qty 2

## 2016-12-19 MED ORDER — SODIUM CHLORIDE 0.9 % IV SOLN: 250.0000 mL | INTRAVENOUS | Status: DC | PRN

## 2016-12-19 MED ORDER — PANTOPRAZOLE SODIUM 40 MG IV SOLR
80.0000 mg | Freq: Once | INTRAVENOUS | Status: AC
  Administered 2016-12-19: 11:00:00 80 mg via INTRAVENOUS
  Filled 2016-12-19: qty 80

## 2016-12-19 MED ORDER — IOPAMIDOL (ISOVUE-370) INJECTION 76%
75.0000 mL | Freq: Once | INTRAVENOUS | Status: AC | PRN
  Administered 2016-12-19: 75 mL via INTRAVENOUS

## 2016-12-19 MED ORDER — ASPIRIN 81 MG PO CHEW: 324.0000 mg | CHEWABLE_TABLET | ORAL | Status: DC

## 2016-12-19 MED ORDER — SODIUM CHLORIDE 0.9 % IV BOLUS (SEPSIS)
500.0000 mL | INTRAVENOUS | Status: AC
  Administered 2016-12-19: 500 mL via INTRAVENOUS

## 2016-12-19 MED ORDER — HEPARIN SODIUM (PORCINE) 5000 UNIT/ML IJ SOLN: 4000.0000 [IU] | Freq: Once | INTRAMUSCULAR | Status: DC

## 2016-12-19 NOTE — Consult Note (Signed)
Ebony Navarro , MD [address], [place], [place], [place], [address] [address] [place], [place], [place], [address] Phone: [phone]  Fax: [phone]  Consultation  Referring Provider:   Dr Leslye Peer Primary Care Physician:  Adin Hector, MD Primary Gastroenterologist: None        Reason for Consultation:  Coffee ground emesis  Date of Admission:  12/19/2016 Date of Consultation:  12/19/2016         HPI:   Ebony Navarro is a 83 y.o. female she has a history of breast cancer status post chemotherapy.  She was seen recently by Dr. Jeani Sow at the oncology outpatient where she presented with a 2-week history of abdominal distention and shortness of breath, she was found to have a large volume of ascites, CT scan of the abdomen pelvis showed a lobulated right ovarian mass concerning for ovarian malignancy with moderate ascites there was also thickening of the distal esophagus concerning for inflammation or malignancy.  When seen at the outpatient for CEA 125 was significantly elevated at 2658, she was found to have ascites and underwent a paracentesis on 5 L of fluid was aspirated and it came back positive for malignancy suggestive of GYN origin.  The plan is from the oncology point of view to proceed with chemotherapy.  CBC on 12/17/2016 revealed a hemoglobin of 12.1 g CMP with normal liver function tests and a creatinine of 0.96.  She presented to the ER on 12/19/2016 just after midnight with acute shortness of breath chest pain she was found to have sharp chest pain  and oxygen saturation of 88% on room air, hemoglobin in the ER was 11.1 g and found to have an elevated troponin of 3.16 initially but rose  to 6.72.  Hence she was admitted with an NSTEMI.  She underwent in the CT scan  which showed no acute PE.  She was commenced on IV heparin for anticoagulation in addition to aspirin.  She had some coffee-ground emesis and hence I was consulted this morning.  Hemoglobin this morning  at 5:25 AM was 12.1 g.   She says that her stool was dark brown the last few days but not black. Denies any NSAID use. Some lower abdominal discomfort. No further emesis after this morning. Still a bit short of breath. Denies any chest pain presentlu   Past Medical History:  Diagnosis Date  . Breast cancer ([place]) 2005   Right, radiation and lumpectomy  . Breast cancer ([place]) 2002   Left, Chemo, radiation and lumpectomy  . Cancer ([place])   . Hyperlipidemia   . Hypertension   . Ovarian cancer ([place]) 11/24/2016    Past Surgical History:  Procedure Laterality Date  . BREAST BIOPSY Left 2010   negative stereotactic biopsy  . BREAST LUMPECTOMY Right 2005   positive  . BREAST LUMPECTOMY Left 2002   positive    Prior to Admission medications   Medication Sig Start Date End Date Taking? Authorizing Provider  atorvastatin (LIPITOR) 40 MG tablet Take 40 mg by mouth daily.    Yes [provider]  dexamethasone (DECADRON) 4 MG tablet Take 8 mg daily by mouth. Start the day after chemotherapy for 2 days   Yes [provider]  diltiazem (CARDIZEM CD) 120 MG 24 hr capsule TAKE 120 MG BY MOUTH EVERY OTHER DAY 10/09/14  Yes [provider]  levothyroxine (SYNTHROID, LEVOTHROID) 50 MCG tablet TAKE 1 TABLET BY MOUTH DAILY ON AN EMPTY STOMACH WITH A  GLASS OF WATER 30-60 MIN BEFORE BREAKFAST 11/11/14  Yes [provider]  lidocaine-prilocaine (EMLA) cream Apply to affected area once 11/26/16  Yes Earlie Server, MD  LORazepam (ATIVAN) 0.5 MG tablet Take 1 tablet (0.5 mg total) by mouth every 6 (six) hours as needed (Nausea or vomiting). 11/26/16  Yes Earlie Server, MD  megestrol (MEGACE) 20 MG tablet Take 1 tablet (20 mg total) by mouth daily. 11/30/16  Yes Burns, Wandra Feinstein, NP  ondansetron (ZOFRAN) 8 MG tablet Take 1 tablet (8 mg total) by mouth 2 (two) times daily as needed for refractory nausea / vomiting. Start on day 3 after chemo. 11/26/16  Yes Earlie Server, MD  prochlorperazine  (COMPAZINE) 10 MG tablet Take 1 tablet (10 mg total) by mouth every 6 (six) hours as needed (Nausea or vomiting). 11/26/16  Yes Earlie Server, MD  promethazine (PHENERGAN) 25 MG tablet Take 1 tablet (25 mg total) by mouth every 6 (six) hours as needed for nausea or vomiting. 11/30/16  Yes Jacquelin Hawking, NP    Family History  Problem Relation Age of Onset  . Hypertension Mother   . Hypertension Father      Social History   Tobacco Use  . Smoking status: Never Smoker  . Smokeless tobacco: Never Used  Substance Use Topics  . Alcohol use: No  . Drug use: No    Allergies as of 12/19/2016 - Review Complete 12/19/2016  Allergen Reaction Noted  . Ace inhibitors Other (See Comments) 01/22/2015  . Atenolol Other (See Comments) 01/22/2015  . Iodine Other (See Comments) 01/22/2015  . Metoprolol tartrate Other (See Comments) 01/22/2015  . Omeprazole Diarrhea 11/20/2016  . Povidone-iodine Other (See Comments) 01/22/2015  . Rofecoxib Nausea Only 01/22/2015  . Ciprofloxacin Rash 01/22/2015  . Doxycycline calcium Rash 01/22/2015  . Nitrofurantoin Rash 09/14/2013  . Quinolones Rash 01/22/2015  . Sulfa antibiotics Rash 01/22/2015    Review of Systems:    All systems reviewed and negative except where noted in HPI.   Physical Exam:  Vital signs in last 24 hours: Temp:  [97.5 F (36.4 C)-97.7 F (36.5 C)] 97.5 F (36.4 C) (11/11 0759) Pulse Rate:  [106-118] 108 (11/11 0759) Resp:  [16-26] 18 (11/11 0522) BP: (114-127)/(73-87) 124/77 (11/11 0759) SpO2:  [90 %-97 %] 93 % (11/11 0759) Weight:  [159 lb 11.2 oz (72.4 kg)-160 lb (72.6 kg)] 159 lb 11.2 oz (72.4 kg) (11/11 0522) Last BM Date: 12/19/16 General:   Pleasant, cooperative in NAD Head:  Normocephalic and atraumatic. Eyes:   No icterus.   Conjunctiva pink. PERRLA. Ears:  Normal auditory acuity. Neck:  Supple; no masses or thyroidomegaly Lungs: Respirations even and unlabored. Lungs clear to auscultation bilaterally.   No wheezes,  crackles, or rhonchi.  Heart:  Regular rate and rhythm;  Without murmur, clicks, rubs or gallops Abdomen:  Soft, nondistended, nontender. Normal bowel sounds. No appreciable masses or hepatomegaly.  No rebound or guarding.  Neurologic:  Alert and oriented x3;  grossly normal neurologically. Skin:  Intact without significant lesions or rashes. Cervical Nodes:  No significant cervical adenopathy. Psych:  Alert and cooperative. Normal affect.  LAB RESULTS: Recent Labs    12/17/16 0840 12/19/16 0212 12/19/16 0525  WBC 5.2 11.7* 11.8*  HGB 12.1 11.1* 12.1  HCT 35.5 34.2* 37.3  PLT 234 219 228   BMET Recent Labs    12/17/16 0840 12/19/16 0212 12/19/16 0525  NA 134* 136 133*  K 4.4 4.0 4.7  CL 103 106 104  CO2 23 20* 20*  GLUCOSE 146* 144* 161*  BUN 29* 40* 43*  CREATININE 0.96 0.92 1.03*  CALCIUM 9.1 7.4* 8.4*   LFT Recent Labs    12/17/16 0840  PROT 7.7  ALBUMIN 3.6  AST 27  ALT 14  ALKPHOS 72  BILITOT 0.9   PT/INR Recent Labs    12/19/16 0212  LABPROT 14.3  INR 1.12    STUDIES: Dg Chest 2 View  Result Date: 12/19/2016 CLINICAL DATA:  Central chest pain and shortness of breath yesterday. History of ovarian cancer, breast cancer, on chemotherapy. EXAM: CHEST  2 VIEW COMPARISON:  CT chest November 20, 2016 FINDINGS: The cardiac silhouette is mildly enlarged and unchanged. Calcified aortic knob. Small pleural effusions. No focal consolidation. RIGHT single-lumen chest Port-A-Cath distal tip projects in mid superior vena cava. No pneumothorax. Soft tissue planes and included osseous structures are nonsuspicious. Calcifications in the neck are likely vascular. Osteopenia. IMPRESSION: Mild cardiomegaly.  Small pleural effusions. Aortic Atherosclerosis (ICD10-I70.0). Electronically Signed   By: Elon Alas M.D.   On: 12/19/2016 02:00   Ct Angio Chest Pe W/cm &/or Wo Cm  Result Date: 12/19/2016 CLINICAL DATA:  Acute onset of central chest pain and shortness of  breath. Current history of ovarian cancer, status post recent chemotherapy. EXAM: CT ANGIOGRAPHY CHEST WITH CONTRAST TECHNIQUE: Multidetector CT imaging of the chest was performed using the standard protocol during bolus administration of intravenous contrast. Multiplanar CT image reconstructions and MIPs were obtained to evaluate the vascular anatomy. CONTRAST:  18mL ISOVUE-370 IOPAMIDOL (ISOVUE-370) INJECTION 76% COMPARISON:  Chest radiograph performed earlier today at 1:41 a.m., and CTA of the chest performed 11/20/2016 FINDINGS: Cardiovascular:  There is no evidence of pulmonary embolus. The heart is borderline normal in size. Diffuse coronary artery calcifications are seen. Calcification is noted at the aortic valve. Scattered calcification is seen along the aortic arch and descending thoracic aorta, and at the great vessels. Borderline prominence of the ascending thoracic aorta is thought to be within normal limits given the patient's age. Mediastinum/Nodes: An enlarged subcarinal node is suggested, measuring 2.0 cm in short axis. Previously noted esophageal wall thickening has largely resolved. A small residual hiatal hernia is seen. No pericardial effusion is identified. The thyroid gland is grossly unremarkable. No axillary lymphadenopathy is seen. A right-sided chest port is noted. Lungs/Pleura: Small bilateral pleural effusions are noted, with associated bibasilar atelectasis. No pneumothorax is seen. No definite mass is identified. Upper Abdomen: The visualized portions of the liver and the spleen are unremarkable. Small stones are noted dependently within the gallbladder. The visualized portions of the adrenal glands and kidneys are grossly unremarkable. There is a 3.4 cm cystic mass at the tail of the pancreas, as noted on prior CT. Musculoskeletal: No acute osseous abnormalities are identified. Multilevel endplate sclerotic change is noted along the thoracic and upper lumbar spine, with vacuum  phenomenon. The visualized musculature is unremarkable in appearance. Review of the MIP images confirms the above findings. IMPRESSION: 1. No evidence of pulmonary embolus. 2. Small bilateral pleural effusions, with bibasilar atelectasis. 3. Diffuse coronary artery calcifications. 4. Enlarged subcarinal node, measuring 2.0 cm in short axis, may reflect the patient's malignancy or may be chronic in nature, though mildly more prominent than on the recent prior CTA. 5. Small residual hiatal hernia noted. 6. Cholelithiasis.  Gallbladder otherwise unremarkable. 7. 3.4 cm cystic mass again noted at the tail of the pancreas; this is relatively stable in appearance. Electronically Signed   By: Garald Balding  M.D.   On: 12/19/2016 04:20      Impression / Plan:   CANDAS DEEMER is a 83 y.o. y/o female admitted last night with NSTEMI , overnight commenced on IV heparin, had an episode of coffee ground emesis. Likely upper GI bleed. Difficult situation , her troponins are rising, very high risk for an EGD. She has had thickening of her esophagus on old CT scan which could be from a neoplasm or from esophagitis. Would avoid EGD if possible at this time , if having severe bleeding will have no choice but to attempt EGD to stop an active bleed otherwise safer to wait atleast for a few days before any endoscopy. In the meanwhile suggest, IV PPI, carafate , keep head end of the bed elevated . Consider an ECHO which will help determine how significant her NSTEMI has been .   Thank you for involving me in the care of this patient.      LOS: 0 days   Ebony Bellows, MD  12/19/2016, 9:50 AM

## 2016-12-19 NOTE — Progress Notes (Signed)
Patient had some dark reddish brown emesis. Dr. Earleen Newport notified and in room stated to go ahead and stop the heparin.

## 2016-12-19 NOTE — ED Triage Notes (Signed)
Patient reports that she has ovarian cancer and received her last chemo treatment on Friday. Patient reports that yesterday she developed central chest pain and shortness of breath.

## 2016-12-19 NOTE — ED Notes (Signed)
Pt's o2sat noted to be 90% on 2L, increased to 3L

## 2016-12-19 NOTE — Progress Notes (Addendum)
Patient ID: Ebony Navarro, female   DOB: 08-17-1933, 83 y.o.   MRN: [id number]  [facility] PROGRESS NOTE  Ebony Navarro HGD:[id number] DOB: [birth year] DOA: 12/19/2016 PCP: Adin Hector, MD  HPI/Subjective: Patient came in with shortness of breath and found to have an elevated troponin.  She was started on heparin drip.  Nursing staff called me that she vomited up dark material that is black.  Patient feels a little bit better after she vomited.  She states that she has been having some black stools.  She was recently diagnosed with ovarian cancer metastatic ascites and undergoing chemotherapy.  She has been taking dexamethasone after the chemotherapy.  She denies taking Advil Motrin or Aleve.  Objective: Vitals:   12/19/16 0522 12/19/16 0759  BP: 114/73 124/77  Pulse: (!) 111 (!) 108  Resp: 18   Temp: 97.7 F (36.5 C) (!) 97.5 F (36.4 C)  SpO2: 95% 93%    Filed Weights   12/19/16 0130 12/19/16 0522  Weight: 72.6 kg (160 lb) 72.4 kg (159 lb 11.2 oz)    ROS: Review of Systems  Constitutional: Negative for chills and fever.  Eyes: Negative for blurred vision.  Respiratory: Positive for shortness of breath. Negative for cough.   Cardiovascular: Negative for chest pain.  Gastrointestinal: Positive for abdominal pain, nausea and vomiting. Negative for constipation and diarrhea.  Genitourinary: Negative for dysuria.  Musculoskeletal: Negative for joint pain.  Neurological: Negative for dizziness and headaches.   Exam: Physical Exam  Constitutional: She is oriented to person, place, and time.  HENT:  Nose: No mucosal edema.  Mouth/Throat: No oropharyngeal exudate or posterior oropharyngeal edema.  Eyes: Conjunctivae, EOM and lids are normal. Pupils are equal, round, and reactive to light.  Neck: No JVD present. Carotid bruit is not present. No edema present. No thyroid mass and no thyromegaly present.  Cardiovascular: S1 normal and S2 normal. Tachycardia  present. Exam reveals no gallop.  No murmur heard. Pulses:      Dorsalis pedis pulses are 2+ on the right side, and 2+ on the left side.  Respiratory: No respiratory distress. She has decreased breath sounds in the right lower field and the left lower field. She has no wheezes. She has no rhonchi. She has no rales.  GI: Soft. Bowel sounds are normal. She exhibits distension. There is tenderness in the epigastric area.  Musculoskeletal:       Right ankle: She exhibits no swelling.       Left ankle: She exhibits no swelling.  Lymphadenopathy:    She has no cervical adenopathy.  Neurological: She is alert and oriented to person, place, and time. No cranial nerve deficit.  Skin: Skin is warm. No rash noted. Nails show no clubbing.  Psychiatric: She has a normal mood and affect.      Data Reviewed: Basic Metabolic Panel: Recent Labs  Lab 12/17/16 0840 12/19/16 0212 12/19/16 0525  NA 134* 136 133*  K 4.4 4.0 4.7  CL 103 106 104  CO2 23 20* 20*  GLUCOSE 146* 144* 161*  BUN 29* 40* 43*  CREATININE 0.96 0.92 1.03*  CALCIUM 9.1 7.4* 8.4*   Liver Function Tests: Recent Labs  Lab 12/17/16 0840  AST 27  ALT 14  ALKPHOS 72  BILITOT 0.9  PROT 7.7  ALBUMIN 3.6   CBC: Recent Labs  Lab 12/17/16 0840 12/19/16 0212 12/19/16 0525  WBC 5.2 11.7* 11.8*  NEUTROABS 3.4  --   --   HGB  12.1 11.1* 12.1  HCT 35.5 34.2* 37.3  MCV 91.4 93.3 93.1  PLT 234 219 228   Cardiac Enzymes: Recent Labs  Lab 12/19/16 0212 12/19/16 0525  TROPONINI 3.16* 6.72*     Studies: Dg Chest 2 View  Result Date: 12/19/2016 CLINICAL DATA:  Central chest pain and shortness of breath yesterday. History of ovarian cancer, breast cancer, on chemotherapy. EXAM: CHEST  2 VIEW COMPARISON:  CT chest November 20, 2016 FINDINGS: The cardiac silhouette is mildly enlarged and unchanged. Calcified aortic knob. Small pleural effusions. No focal consolidation. RIGHT single-lumen chest Port-A-Cath distal tip projects  in mid superior vena cava. No pneumothorax. Soft tissue planes and included osseous structures are nonsuspicious. Calcifications in the neck are likely vascular. Osteopenia. IMPRESSION: Mild cardiomegaly.  Small pleural effusions. Aortic Atherosclerosis (ICD10-I70.0). Electronically Signed   By: Elon Alas M.D.   On: 12/19/2016 02:00   Ct Angio Chest Pe W/cm &/or Wo Cm  Result Date: 12/19/2016 CLINICAL DATA:  Acute onset of central chest pain and shortness of breath. Current history of ovarian cancer, status post recent chemotherapy. EXAM: CT ANGIOGRAPHY CHEST WITH CONTRAST TECHNIQUE: Multidetector CT imaging of the chest was performed using the standard protocol during bolus administration of intravenous contrast. Multiplanar CT image reconstructions and MIPs were obtained to evaluate the vascular anatomy. CONTRAST:  23mL ISOVUE-370 IOPAMIDOL (ISOVUE-370) INJECTION 76% COMPARISON:  Chest radiograph performed earlier today at 1:41 a.m., and CTA of the chest performed 11/20/2016 FINDINGS: Cardiovascular:  There is no evidence of pulmonary embolus. The heart is borderline normal in size. Diffuse coronary artery calcifications are seen. Calcification is noted at the aortic valve. Scattered calcification is seen along the aortic arch and descending thoracic aorta, and at the great vessels. Borderline prominence of the ascending thoracic aorta is thought to be within normal limits given the patient's age. Mediastinum/Nodes: An enlarged subcarinal node is suggested, measuring 2.0 cm in short axis. Previously noted esophageal wall thickening has largely resolved. A small residual hiatal hernia is seen. No pericardial effusion is identified. The thyroid gland is grossly unremarkable. No axillary lymphadenopathy is seen. A right-sided chest port is noted. Lungs/Pleura: Small bilateral pleural effusions are noted, with associated bibasilar atelectasis. No pneumothorax is seen. No definite mass is identified. Upper  Abdomen: The visualized portions of the liver and the spleen are unremarkable. Small stones are noted dependently within the gallbladder. The visualized portions of the adrenal glands and kidneys are grossly unremarkable. There is a 3.4 cm cystic mass at the tail of the pancreas, as noted on prior CT. Musculoskeletal: No acute osseous abnormalities are identified. Multilevel endplate sclerotic change is noted along the thoracic and upper lumbar spine, with vacuum phenomenon. The visualized musculature is unremarkable in appearance. Review of the MIP images confirms the above findings. IMPRESSION: 1. No evidence of pulmonary embolus. 2. Small bilateral pleural effusions, with bibasilar atelectasis. 3. Diffuse coronary artery calcifications. 4. Enlarged subcarinal node, measuring 2.0 cm in short axis, may reflect the patient's malignancy or may be chronic in nature, though mildly more prominent than on the recent prior CTA. 5. Small residual hiatal hernia noted. 6. Cholelithiasis.  Gallbladder otherwise unremarkable. 7. 3.4 cm cystic mass again noted at the tail of the pancreas; this is relatively stable in appearance. Electronically Signed   By: Garald Balding M.D.   On: 12/19/2016 04:20    Scheduled Meds: . atorvastatin  40 mg Oral Daily  . diltiazem  120 mg Oral Daily  . levothyroxine  50 mcg Oral  QAC breakfast  . megestrol  20 mg Oral Daily  . nitroGLYCERIN  1 inch Topical Q6H  . [START ON 12/22/2016] pantoprazole  40 mg Intravenous Q12H  . sodium chloride flush  3 mL Intravenous Q12H   Continuous Infusions: . sodium chloride    . pantoprazole (PROTONIX) IVPB    . pantoprozole (PROTONIX) infusion      Assessment/Plan:  1. Upper GI bleed with hematemesis.  Hold Decadron.  Stop heparin drip.  Hold aspirin.  Start Protonix drip.  Case discussed with gastroenterology to evaluate.  Currently n.p.o.  Serial hemoglobins  2. NSTEMI.  Will be limited with medications and treatment secondary to GI  bleed.  Case discussed with cardiology.  Heparin drip and aspirin held.  Intolerant to 2 previous beta-blockers.  Patient on Nitropatch. 3. 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  Ovarian cancer primary.  Patient received her second chemotherapy on Friday. 4. Hyperlipidemia unspecified on atorvastatin 5. Hypothyroidism unspecified. On levothyroxine. 6. Aortic stenosis seen on last echo  Code Status:     Code Status Orders  (From admission, onward)        Start     Ordered   12/19/16 0526  Full code  Continuous     12/19/16 0526    Code Status History    Date Active Date Inactive Code Status Order ID Comments User Context   11/20/2016 16:39 11/22/2016 19:50 Full Code [id number]  Idelle Crouch, MD ED    Advance Directive Documentation     Most Recent Value  Type of Advance Directive  Healthcare Power of Attorney  Pre-existing out of facility DNR order (yellow form or pink MOST form)  No data  "MOST" Form in Place?  No data     Family Communication: spoke with daughter on phone Disposition Plan: tbd  Consultants:  Cardiology  Gastroenterology  Time spent: 45 minutes, case discussed with GI and Cardiology  [place]

## 2016-12-19 NOTE — ED Notes (Signed)
Date and time results received: 12/19/16 0250  Test: Troponin Critical Value: 3.16  Name of Provider Notified: Karma Greaser  Orders Received? Or Actions Taken?: MD entering orders for heparin

## 2016-12-19 NOTE — ED Notes (Signed)
ED Provider at bedside. 

## 2016-12-19 NOTE — Progress Notes (Signed)
ANTICOAGULATION CONSULT NOTE - Initial Consult  Pharmacy Consult for heparin Indication: chest pain/ACS  Allergies  Allergen Reactions  . Ace Inhibitors Other (See Comments)    hyperkalemia  . Atenolol Other (See Comments)    Worsening palpitations  . Iodine Other (See Comments)    11/20/16: per conversation with pt, pt with allergy to topical iodine and betadine.  Pt states she has had IV contrast in the past with now issues.  . Metoprolol Tartrate Other (See Comments)    intolerant  . Omeprazole Diarrhea  . Povidone-Iodine Other (See Comments)  . Rofecoxib Nausea Only  . Ciprofloxacin Rash  . Doxycycline Calcium Rash  . Nitrofurantoin Rash  . Quinolones Rash  . Sulfa Antibiotics Rash    Patient Measurements: Height: 5\' 7"  (170.2 cm) Weight: 160 lb (72.6 kg) IBW/kg (Calculated) : 61.6 Heparin Dosing Weight: 72.6 kg  Vital Signs: Temp: 97.6 F (36.4 C) (11/11 0130) Temp Source: Oral (11/11 0130) BP: 122/83 (11/11 0200) Pulse Rate: 111 (11/11 0200)  Labs: Recent Labs    12/17/16 0840 12/19/16 0212  HGB 12.1 11.1*  HCT 35.5 34.2*  PLT 234 219  CREATININE 0.96 0.92  TROPONINI  --  3.16*    Estimated Creatinine Clearance: 46.6 mL/min (by C-G formula based on SCr of 0.92 mg/dL).   Medical History: Past Medical History:  Diagnosis Date  . Breast cancer ([place]) 2005   Right, radiation and lumpectomy  . Breast cancer ([place]) 2002   Left, Chemo, radiation and lumpectomy  . Cancer ([place])   . Hyperlipidemia   . Hypertension   . Ovarian cancer ([place]) 11/24/2016    Medications:  Scheduled:  . heparin  4,000 Units Intravenous Once  . heparin  4,000 Units Intravenous Once  . pentafluoroprop-tetrafluoroeth        Assessment: Patient admitted w/ central chest pain s/p chemo for breast cancer. Trops 3.16. No PTA anticoagulation, patient is being started on heparin drip for possible NSTEMI.  Goal of Therapy:  Heparin level 0.3-0.7 units/ml Monitor platelets by  anticoagulation protocol: Yes   Plan:  Will bolus w/ heparin 4000 units IV x 1 Will start heparin drip @ 850 units/hr  Will check HL @ 1100. Baseline labs ordered. Will monitor daily CBC and adjust per anti-Xa levels.  Tobie Lords, PharmD, BCPS Clinical Pharmacist 12/19/2016

## 2016-12-19 NOTE — ED Notes (Signed)
Initial oxygen saturation at 88-89%, placed on 2L with no change.  Bumped to 4L [place] and patient corrected to 98%.  Will re-evaluate and adjust oxygen after port accessed.

## 2016-12-19 NOTE — ED Provider Notes (Signed)
[facility] Emergency Department Provider Note  ____________________________________________   First MD Initiated Contact with Patient 12/19/16 0221     (approximate)  I have reviewed the triage vital signs and the nursing notes.   HISTORY  Chief Complaint Chest Pain and Shortness of Breath    HPI Ebony Navarro is a 83 y.o. female with a complicated medical history that includes malignant ovarian cancer for which she is currently actively undergoing chemo and just started her chemotherapy a little over 3 weeks ago.  She was admitted to the hospital at the time of her initial diagnosis and had an otherwise essentially normal workup.  She presents tonight for evaluation of acute onset shortness of breath yesterday that is gradually gotten worse.  She is also having some intermittent aching chest pain.  Nothing makes it better and nothing makes it worse.  She thinks it is the result of the dexamethasone she was given after her chemotherapy.  Of note she does not have any diagnosis of lung disease and not have an oxygen requirement at baseline, but her oxygen saturation was 88% on room air in the ED.  She was started on 2 L which did not improve it but on 4 L she came up to the mid to upper 90s..  She does have a port which was accessed in the ED.  She denies fever/chills, nausea, vomiting, and abdominal pain.  Past Medical History:  Diagnosis Date  . Breast cancer ([place]) 2005   Right, radiation and lumpectomy  . Breast cancer ([place]) 2002   Left, Chemo, radiation and lumpectomy  . Cancer ([place])   . Hyperlipidemia   . Hypertension   . Ovarian cancer ([place]) 11/24/2016    Patient Active Problem List   Diagnosis Date Noted  . Non-STEMI (non-ST elevated myocardial infarction) ([place]) 12/19/2016  . Ovarian cancer ([place]) 11/24/2016  . Malignant neoplasm of ovary ([place]) 11/24/2016  . Abdominal pain 11/20/2016  . Ascites 11/20/2016  . Ovarian mass 11/20/2016  .  Absolute anemia 01/22/2015  . Anxiety 01/22/2015  . Malignant neoplasm of breast ([place]) 01/22/2015  . Clinical depression 01/22/2015  . Gastric catarrh 01/22/2015  . Blood glucose elevated 01/22/2015  . HLD (hyperlipidemia) 01/22/2015  . BP (high blood pressure) 01/22/2015  . Acquired atrophy of thyroid 04/02/2014  . Frequent UTI 11/23/2013  . Chronic kidney disease (CKD), stage III (moderate) ([place]) 10/02/2013    Past Surgical History:  Procedure Laterality Date  . BREAST BIOPSY Left 2010   negative stereotactic biopsy  . BREAST LUMPECTOMY Right 2005   positive  . BREAST LUMPECTOMY Left 2002   positive    Prior to Admission medications   Medication Sig Start Date End Date Taking? Authorizing Provider  atorvastatin (LIPITOR) 40 MG tablet Take 40 mg by mouth daily.    Yes [provider]  dexamethasone (DECADRON) 4 MG tablet Take 8 mg daily by mouth. Start the day after chemotherapy for 2 days   Yes [provider]  diltiazem (CARDIZEM CD) 120 MG 24 hr capsule TAKE 120 MG BY MOUTH EVERY OTHER DAY 10/09/14  Yes [provider]  levothyroxine (SYNTHROID, LEVOTHROID) 50 MCG tablet TAKE 1 TABLET BY MOUTH DAILY ON AN EMPTY STOMACH WITH A GLASS OF WATER 30-60 MIN BEFORE BREAKFAST 11/11/14  Yes [provider]  lidocaine-prilocaine (EMLA) cream Apply to affected area once 11/26/16  Yes Earlie Server, MD  LORazepam (ATIVAN) 0.5 MG tablet Take 1 tablet (0.5 mg total) by mouth every  6 (six) hours as needed (Nausea or vomiting). 11/26/16  Yes Earlie Server, MD  megestrol (MEGACE) 20 MG tablet Take 1 tablet (20 mg total) by mouth daily. 11/30/16  Yes Burns, Wandra Feinstein, NP  ondansetron (ZOFRAN) 8 MG tablet Take 1 tablet (8 mg total) by mouth 2 (two) times daily as needed for refractory nausea / vomiting. Start on day 3 after chemo. 11/26/16  Yes Earlie Server, MD  prochlorperazine (COMPAZINE) 10 MG tablet Take 1 tablet (10 mg total) by mouth every 6 (six) hours as needed (Nausea  or vomiting). 11/26/16  Yes Earlie Server, MD  promethazine (PHENERGAN) 25 MG tablet Take 1 tablet (25 mg total) by mouth every 6 (six) hours as needed for nausea or vomiting. 11/30/16  Yes Jacquelin Hawking, NP    Allergies Ace inhibitors; Atenolol; Iodine; Metoprolol tartrate; Omeprazole; Povidone-iodine; Rofecoxib; Ciprofloxacin; Doxycycline calcium; Nitrofurantoin; Quinolones; and Sulfa antibiotics  Family History  Problem Relation Age of Onset  . Hypertension Mother   . Hypertension Father     Social History Social History   Tobacco Use  . Smoking status: Never Smoker  . Smokeless tobacco: Never Used  Substance Use Topics  . Alcohol use: No  . Drug use: No    Review of Systems Constitutional: No fever/chills Cardiovascular: +chest pain (mild, aching) Respiratory: +shortness of breath. Gastrointestinal: No abdominal pain.  No nausea, no vomiting.  No diarrhea.  No constipation. Genitourinary: Negative for dysuria. Musculoskeletal: Negative for neck pain.  Negative for back pain. Integumentary: Negative for rash. Neurological: Negative for headaches, focal weakness or numbness.   ____________________________________________   PHYSICAL EXAM:  VITAL SIGNS: ED Triage Vitals  Enc Vitals Group     BP 12/19/16 0200 122/83     Pulse Rate 12/19/16 0130 (!) 118     Resp 12/19/16 0130 (!) 22     Temp 12/19/16 0130 97.6 F (36.4 C)     Temp Source 12/19/16 0130 Oral     SpO2 12/19/16 0130 93 %     Weight 12/19/16 0130 72.6 kg (160 lb)     Height 12/19/16 0130 1.702 m (5\' 7" )     Head Circumference --      Peak Flow --      Pain Score 12/19/16 0129 2     Pain Loc --      Pain Edu? --      Excl. in [place]? --     Constitutional: Alert and oriented.  Appears chronically ill but not in acute distress at this time Eyes: Conjunctivae are normal.  Head: Atraumatic. Nose: No congestion/rhinnorhea. Mouth/Throat: Mucous membranes are moist. Neck: No stridor.  No meningeal signs.    Cardiovascular: Sinus tachycardia, regular rhythm. Good peripheral circulation. Grossly normal heart sounds. +Port Respiratory: Normal respiratory effort but slightly increased rate.  No retractions. Lungs CTAB. Gastrointestinal: Soft and nontender. No distention.  Musculoskeletal: No lower extremity tenderness nor edema. No gross deformities of extremities. Neurologic:  Normal speech and language. No gross focal neurologic deficits are appreciated.  Skin:  Skin is pale, warm, dry and intact. No rash noted. Psychiatric: Mood and affect are normal. Speech and behavior are normal.  ____________________________________________   LABS (all labs ordered are listed, but only abnormal results are displayed)  Labs Reviewed  BASIC METABOLIC PANEL - Abnormal; Notable for the following components:      Result Value   CO2 20 (*)    Glucose, Bld 144 (*)    BUN 40 (*)    Calcium  7.4 (*)    GFR calc non Af Amer 57 (*)    All other components within normal limits  CBC - Abnormal; Notable for the following components:   WBC 11.7 (*)    RBC 3.66 (*)    Hemoglobin 11.1 (*)    HCT 34.2 (*)    RDW 15.3 (*)    All other components within normal limits  TROPONIN I - Abnormal; Notable for the following components:   Troponin I 3.16 (*)    All other components within normal limits  TROPONIN I - Abnormal; Notable for the following components:   Troponin I 6.72 (*)    All other components within normal limits  BASIC METABOLIC PANEL - Abnormal; Notable for the following components:   Sodium 133 (*)    CO2 20 (*)    Glucose, Bld 161 (*)    BUN 43 (*)    Creatinine, Ser 1.03 (*)    Calcium 8.4 (*)    GFR calc non Af Amer 50 (*)    GFR calc Af Amer 57 (*)    All other components within normal limits  CBC - Abnormal; Notable for the following components:   WBC 11.8 (*)    RDW 15.3 (*)    All other components within normal limits  PROTIME-INR  APTT  LIPID PANEL  HEPARIN LEVEL (UNFRACTIONATED)    TROPONIN I  TROPONIN I   ____________________________________________  EKG  ED ECG REPORT I, Alistar Mcenery, the attending physician, personally viewed and interpreted this ECG.  Date: 12/19/2016 EKG Time: 1:32 AM Rate: 114 Rhythm: Sinus tachycardia QRS Axis: Left axis deviation Intervals: normal with LVH ST/T Wave abnormalities: Non-specific ST segment / T-wave changes, but no evidence of acute ischemia. Narrative Interpretation: no evidence of acute ischemia   ____________________________________________  RADIOLOGY   Dg Chest 2 View  Result Date: 12/19/2016 CLINICAL DATA:  Central chest pain and shortness of breath yesterday. History of ovarian cancer, breast cancer, on chemotherapy. EXAM: CHEST  2 VIEW COMPARISON:  CT chest November 20, 2016 FINDINGS: The cardiac silhouette is mildly enlarged and unchanged. Calcified aortic knob. Small pleural effusions. No focal consolidation. RIGHT single-lumen chest Port-A-Cath distal tip projects in mid superior vena cava. No pneumothorax. Soft tissue planes and included osseous structures are nonsuspicious. Calcifications in the neck are likely vascular. Osteopenia. IMPRESSION: Mild cardiomegaly.  Small pleural effusions. Aortic Atherosclerosis (ICD10-I70.0). Electronically Signed   By: Elon Alas M.D.   On: 12/19/2016 02:00   Ct Angio Chest Pe W/cm &/or Wo Cm  Result Date: 12/19/2016 CLINICAL DATA:  Acute onset of central chest pain and shortness of breath. Current history of ovarian cancer, status post recent chemotherapy. EXAM: CT ANGIOGRAPHY CHEST WITH CONTRAST TECHNIQUE: Multidetector CT imaging of the chest was performed using the standard protocol during bolus administration of intravenous contrast. Multiplanar CT image reconstructions and MIPs were obtained to evaluate the vascular anatomy. CONTRAST:  17mL ISOVUE-370 IOPAMIDOL (ISOVUE-370) INJECTION 76% COMPARISON:  Chest radiograph performed earlier today at 1:41 a.m., and CTA  of the chest performed 11/20/2016 FINDINGS: Cardiovascular:  There is no evidence of pulmonary embolus. The heart is borderline normal in size. Diffuse coronary artery calcifications are seen. Calcification is noted at the aortic valve. Scattered calcification is seen along the aortic arch and descending thoracic aorta, and at the great vessels. Borderline prominence of the ascending thoracic aorta is thought to be within normal limits given the patient's age. Mediastinum/Nodes: An enlarged subcarinal node is suggested, measuring 2.0 cm  in short axis. Previously noted esophageal wall thickening has largely resolved. A small residual hiatal hernia is seen. No pericardial effusion is identified. The thyroid gland is grossly unremarkable. No axillary lymphadenopathy is seen. A right-sided chest port is noted. Lungs/Pleura: Small bilateral pleural effusions are noted, with associated bibasilar atelectasis. No pneumothorax is seen. No definite mass is identified. Upper Abdomen: The visualized portions of the liver and the spleen are unremarkable. Small stones are noted dependently within the gallbladder. The visualized portions of the adrenal glands and kidneys are grossly unremarkable. There is a 3.4 cm cystic mass at the tail of the pancreas, as noted on prior CT. Musculoskeletal: No acute osseous abnormalities are identified. Multilevel endplate sclerotic change is noted along the thoracic and upper lumbar spine, with vacuum phenomenon. The visualized musculature is unremarkable in appearance. Review of the MIP images confirms the above findings. IMPRESSION: 1. No evidence of pulmonary embolus. 2. Small bilateral pleural effusions, with bibasilar atelectasis. 3. Diffuse coronary artery calcifications. 4. Enlarged subcarinal node, measuring 2.0 cm in short axis, may reflect the patient's malignancy or may be chronic in nature, though mildly more prominent than on the recent prior CTA. 5. Small residual hiatal hernia  noted. 6. Cholelithiasis.  Gallbladder otherwise unremarkable. 7. 3.4 cm cystic mass again noted at the tail of the pancreas; this is relatively stable in appearance. Electronically Signed   By: Garald Balding M.D.   On: 12/19/2016 04:20    ____________________________________________   PROCEDURES  Critical Care performed: Yes, see critical care procedure note(s)   Procedure(s) performed:   .Critical Care Performed by: Hinda Kehr, MD Authorized by: Hinda Kehr, MD   Critical care provider statement:    Critical care time (minutes):  30   Critical care time was exclusive of:  Separately billable procedures and treating other patients   Critical care was necessary to treat or prevent imminent or life-threatening deterioration of the following conditions:  Circulatory failure and cardiac failure   Critical care was time spent personally by me on the following activities:  Development of treatment plan with patient or surrogate, discussions with consultants, evaluation of patient's response to treatment, examination of patient, obtaining history from patient or surrogate, ordering and performing treatments and interventions, ordering and review of laboratory studies, ordering and review of radiographic studies, pulse oximetry, re-evaluation of patient's condition and review of old charts      ____________________________________________   INITIAL IMPRESSION / ASSESSMENT AND PLAN / ED COURSE  As part of my medical decision making, I reviewed the following data within the [place] notes reviewed and incorporated, Labs reviewed , Radiograph reviewed  and Discussed with admitting physician  and EKG reviewed    Differential includes, but is not limited to, viral syndrome, bronchitis including COPD exacerbation, pneumonia, reactive airway disease including asthma, CHF including exacerbation with or without pulmonary/interstitial edema, pneumothorax, ACS,  thoracic trauma, and pulmonary embolism.  Particularly concern for pulmonary embolism given her active cancer treatment, tachypnea, tachycardia, and new oxygen requirement.  Once I verify that her kidney function is still normal I will obtain a CT angio chest.  ACS is also possible given that she has no lung disease at baseline and her lungs are clear to auscultation at this time.  I am evaluating him broadly and explained my plan to the patient and her family and they understand and agree.  On oxygen she is in no acute distress.   Clinical Course as of Dec 19 800  Sun Dec 19, 2016  0257 I was informed by the lab via the nurses that the patient's troponin is 3.16.  I looked back and verified that she had a normal troponin of about a month ago when she was first admitted and her cancer diagnosis was identified.  I updated the patient and family.  I have ordered heparin bolus and infusion because whether or not this is primarily an NSTEMI or a pulmonary embolism leading to NSTEMI, she will still need heparin.  I am still going to obtain the CT angio and then will contact the hospitalist.  Family understands and agrees with the plan.  [CF]  0439 No PE on CTA.  Patient stable, still tachy at about 110.  Discussed by phone with Dr. Estanislado Pandy who will admit.  [CF]  [phone] Updated patient and family.  [CF]    Clinical Course User Index [CF] Hinda Kehr, MD    ____________________________________________  FINAL CLINICAL IMPRESSION(S) / ED DIAGNOSES  Final diagnoses:  NSTEMI (non-ST elevated myocardial infarction) ([place])  Acute respiratory failure with hypoxemia ([place])     MEDICATIONS GIVEN DURING THIS VISIT:  Medications  heparin ADULT infusion 100 units/mL (25000 units/244mL sodium chloride 0.45%) (850 Units/hr Intravenous Handoff 12/19/16 0728)  diltiazem (CARDIZEM CD) 24 hr capsule 120 mg (not administered)  levothyroxine (SYNTHROID, LEVOTHROID) tablet 50 mcg (not administered)  megestrol  (MEGACE) tablet 20 mg (not administered)  aspirin chewable tablet 324 mg (324 mg Oral Not Given 12/19/16 0547)    Or  aspirin suppository 300 mg ( Rectal See Alternative 12/19/16 0547)  aspirin EC tablet 81 mg (not administered)  nitroGLYCERIN (NITROSTAT) SL tablet 0.4 mg (not administered)  acetaminophen (TYLENOL) tablet 650 mg (not administered)  ondansetron (ZOFRAN) injection 4 mg (not administered)  sodium chloride flush (NS) 0.9 % injection 3 mL (not administered)  sodium chloride flush (NS) 0.9 % injection 3 mL (not administered)  0.9 %  sodium chloride infusion (not administered)  nitroGLYCERIN (NITROGLYN) 2 % ointment 1 inch (1 inch Topical Given 12/19/16 0607)  morphine 2 MG/ML injection 2 mg (not administered)  atorvastatin (LIPITOR) tablet 40 mg (40 mg Oral Given 12/19/16 0607)  pentafluoroprop-tetrafluoroeth (GEBAUERS) aerosol (  Given by Other 12/19/16 0217)  sodium chloride 0.9 % bolus 500 mL (0 mLs Intravenous Stopped 12/19/16 0400)  heparin bolus via infusion 4,000 Units (4,000 Units Intravenous Bolus from Bag 12/19/16 0359)  iopamidol (ISOVUE-370) 76 % injection 75 mL (75 mLs Intravenous Contrast Given 12/19/16 0337)  ondansetron (ZOFRAN) 4 MG/2ML injection (4 mg  Given 12/19/16 0357)  aspirin chewable tablet 324 mg (324 mg Oral Given 12/19/16 0442)     ED Discharge Orders    None       Note:  This document was prepared using Dragon voice recognition software and may include unintentional dictation errors.    Hinda Kehr, MD 12/19/16 [phone]

## 2016-12-19 NOTE — H&P (Signed)
[facility] NAME: Ebony Navarro    MR#:  [id number]  DATE OF BIRTH:  March 16, 1933  DATE OF ADMISSION:  12/19/2016  PRIMARY CARE PHYSICIAN: Adin Hector, MD   REQUESTING/REFERRING PHYSICIAN:   CHIEF COMPLAINT:   Chief Complaint  Patient presents with  . Chest Pain  . Shortness of Breath    HISTORY OF PRESENT ILLNESS: Ebony Navarro  is a 83 y.o. female with a known history of breast cancer, ovarian cancer, hyperlipidemia, hypertension presented to the emergency room with shortness of breath and chest pressure. Shortness of breath started last night. Patient not on home oxygen. She felt short of breath and had chest discomfort which was aching in nature. Patient was worked up with CT chest which showed no pulmonary embolism, small bilateral pleural effusions were noted. During the workup in the emergency room troponin was elevated. Patient was started on IV heparin drip for anticoagulation and was started on cardiac medications. Hospitalist service was consulted for further care.  PAST MEDICAL HISTORY:   Past Medical History:  Diagnosis Date  . Breast cancer ([place]) 2005   Right, radiation and lumpectomy  . Breast cancer ([place]) 2002   Left, Chemo, radiation and lumpectomy  . Cancer ([place])   . Hyperlipidemia   . Hypertension   . Ovarian cancer ([place]) 11/24/2016    PAST SURGICAL HISTORY:  Past Surgical History:  Procedure Laterality Date  . BREAST BIOPSY Left 2010   negative stereotactic biopsy  . BREAST LUMPECTOMY Right 2005   positive  . BREAST LUMPECTOMY Left 2002   positive    SOCIAL HISTORY:  Social History   Tobacco Use  . Smoking status: Never Smoker  . Smokeless tobacco: Never Used  Substance Use Topics  . Alcohol use: No    FAMILY HISTORY:  Family History  Problem Relation Age of Onset  . Hypertension Mother   . Hypertension Father     DRUG ALLERGIES:  Allergies  Allergen Reactions   . Ace Inhibitors Other (See Comments)    hyperkalemia  . Atenolol Other (See Comments)    Worsening palpitations  . Iodine Other (See Comments)    11/20/16: per conversation with pt, pt with allergy to topical iodine and betadine.  Pt states she has had IV contrast in the past with now issues.  . Metoprolol Tartrate Other (See Comments)    intolerant  . Omeprazole Diarrhea  . Povidone-Iodine Other (See Comments)  . Rofecoxib Nausea Only  . Ciprofloxacin Rash  . Doxycycline Calcium Rash  . Nitrofurantoin Rash  . Quinolones Rash  . Sulfa Antibiotics Rash    REVIEW OF SYSTEMS:   CONSTITUTIONAL: No fever, fatigue or weakness.  EYES: No blurred or double vision.  EARS, NOSE, AND THROAT: No tinnitus or ear pain.  RESPIRATORY: No cough, has shortness of breath,  No wheezing or hemoptysis.  CARDIOVASCULAR: Has chest pain,  No orthopnea, edema.  GASTROINTESTINAL: No nausea, vomiting, diarrhea or abdominal pain.  GENITOURINARY: No dysuria, hematuria.  ENDOCRINE: No polyuria, nocturia,  HEMATOLOGY: No anemia, easy bruising or bleeding SKIN: No rash or lesion. MUSCULOSKELETAL: No joint pain or arthritis.   NEUROLOGIC: No tingling, numbness, weakness.  PSYCHIATRY: No anxiety or depression.   MEDICATIONS AT HOME:  Prior to Admission medications   Medication Sig Start Date End Date Taking? Authorizing Provider  atorvastatin (LIPITOR) 40 MG tablet Take 40 mg by mouth daily.    Yes [provider]  dexamethasone (DECADRON)  4 MG tablet Take 8 mg daily by mouth. Start the day after chemotherapy for 2 days   Yes [provider]  diltiazem (CARDIZEM CD) 120 MG 24 hr capsule TAKE 120 MG BY MOUTH EVERY OTHER DAY 10/09/14  Yes [provider]  levothyroxine (SYNTHROID, LEVOTHROID) 50 MCG tablet TAKE 1 TABLET BY MOUTH DAILY ON AN EMPTY STOMACH WITH A GLASS OF WATER 30-60 MIN BEFORE BREAKFAST 11/11/14  Yes [provider]  lidocaine-prilocaine (EMLA) cream Apply  to affected area once 11/26/16  Yes Earlie Server, MD  LORazepam (ATIVAN) 0.5 MG tablet Take 1 tablet (0.5 mg total) by mouth every 6 (six) hours as needed (Nausea or vomiting). 11/26/16  Yes Earlie Server, MD  megestrol (MEGACE) 20 MG tablet Take 1 tablet (20 mg total) by mouth daily. 11/30/16  Yes Burns, Wandra Feinstein, NP  ondansetron (ZOFRAN) 8 MG tablet Take 1 tablet (8 mg total) by mouth 2 (two) times daily as needed for refractory nausea / vomiting. Start on day 3 after chemo. 11/26/16  Yes Earlie Server, MD  prochlorperazine (COMPAZINE) 10 MG tablet Take 1 tablet (10 mg total) by mouth every 6 (six) hours as needed (Nausea or vomiting). 11/26/16  Yes Earlie Server, MD  promethazine (PHENERGAN) 25 MG tablet Take 1 tablet (25 mg total) by mouth every 6 (six) hours as needed for nausea or vomiting. 11/30/16  Yes Burns, Wandra Feinstein, NP      PHYSICAL EXAMINATION:   VITAL SIGNS: Blood pressure 125/84, pulse (!) 106, temperature 97.6 F (36.4 C), temperature source Oral, resp. rate (!) 22, height 5\' 7"  (1.702 m), weight 72.6 kg (160 lb), SpO2 93 %.  GENERAL:  83 y.o.-year-old patient lying in the bed with no acute distress.  EYES: Pupils equal, round, reactive to light and accommodation. No scleral icterus. Extraocular muscles intact.  HEENT: Head atraumatic, normocephalic. Oropharynx and nasopharynx clear.  NECK:  Supple, no jugular venous distention. No thyroid enlargement, no tenderness.  LUNGS: Decreased breath sounds bilaterally, basal crepitations heard. No use of accessory muscles of respiration.  CARDIOVASCULAR: S1, S2 normal. No murmurs, rubs, or gallops.  ABDOMEN: Soft, nontender, nondistended. Bowel sounds present. No organomegaly or mass.  EXTREMITIES: No pedal edema, cyanosis, or clubbing.  NEUROLOGIC: Cranial nerves II through XII are intact. Muscle strength 5/5 in all extremities. Sensation intact. Gait not checked.  PSYCHIATRIC: The patient is alert and oriented x 3.  SKIN: No obvious rash, lesion,  or ulcer.   LABORATORY PANEL:   CBC Recent Labs  Lab 12/17/16 0840 12/19/16 0212  WBC 5.2 11.7*  HGB 12.1 11.1*  HCT 35.5 34.2*  PLT 234 219  MCV 91.4 93.3  MCH 31.1 30.3  MCHC 34.0 32.5  RDW 15.0* 15.3*  LYMPHSABS 1.2  --   MONOABS 0.5  --   EOSABS 0.0  --   BASOSABS 0.0  --    ------------------------------------------------------------------------------------------------------------------  Chemistries  Recent Labs  Lab 12/17/16 0840 12/19/16 0212  NA 134* 136  K 4.4 4.0  CL 103 106  CO2 23 20*  GLUCOSE 146* 144*  BUN 29* 40*  CREATININE 0.96 0.92  CALCIUM 9.1 7.4*  AST 27  --   ALT 14  --   ALKPHOS 72  --   BILITOT 0.9  --    ------------------------------------------------------------------------------------------------------------------ estimated creatinine clearance is 46.6 mL/min (by C-G formula based on SCr of 0.92 mg/dL). ------------------------------------------------------------------------------------------------------------------ No results for input(s): TSH, T4TOTAL, T3FREE, THYROIDAB in the last 72 hours.  Invalid input(s): FREET3  Coagulation profile Recent Labs  Lab 12/19/16 0212  INR 1.12   ------------------------------------------------------------------------------------------------------------------- No results for input(s): DDIMER in the last 72 hours. -------------------------------------------------------------------------------------------------------------------  Cardiac Enzymes Recent Labs  Lab 12/19/16 0212  TROPONINI 3.16*   ------------------------------------------------------------------------------------------------------------------ Invalid input(s): POCBNP  ---------------------------------------------------------------------------------------------------------------  Urinalysis    Component Value Date/Time   COLORURINE YELLOW (A) 12/17/2016 0840   APPEARANCEUR TURBID (A) 12/17/2016 0840   APPEARANCEUR  Cloudy 11/15/2013 1514   LABSPEC 1.013 12/17/2016 0840   LABSPEC 1.005 11/15/2013 1514   PHURINE 6.0 12/17/2016 0840   GLUCOSEU NEGATIVE 12/17/2016 0840   GLUCOSEU Negative 11/15/2013 1514   HGBUR NEGATIVE 12/17/2016 0840   BILIRUBINUR NEGATIVE 12/17/2016 0840   BILIRUBINUR neg 01/22/2015 1516   BILIRUBINUR Negative 11/15/2013 1514   KETONESUR NEGATIVE 12/17/2016 0840   PROTEINUR 100 (A) 12/17/2016 0840   UROBILINOGEN negative 01/22/2015 1516   NITRITE NEGATIVE 12/17/2016 0840   LEUKOCYTESUR LARGE (A) 12/17/2016 0840   LEUKOCYTESUR 3+ 11/15/2013 1514     RADIOLOGY: Dg Chest 2 View  Result Date: 12/19/2016 CLINICAL DATA:  Central chest pain and shortness of breath yesterday. History of ovarian cancer, breast cancer, on chemotherapy. EXAM: CHEST  2 VIEW COMPARISON:  CT chest November 20, 2016 FINDINGS: The cardiac silhouette is mildly enlarged and unchanged. Calcified aortic knob. Small pleural effusions. No focal consolidation. RIGHT single-lumen chest Port-A-Cath distal tip projects in mid superior vena cava. No pneumothorax. Soft tissue planes and included osseous structures are nonsuspicious. Calcifications in the neck are likely vascular. Osteopenia. IMPRESSION: Mild cardiomegaly.  Small pleural effusions. Aortic Atherosclerosis (ICD10-I70.0). Electronically Signed   By: Elon Alas M.D.   On: 12/19/2016 02:00   Ct Angio Chest Pe W/cm &/or Wo Cm  Result Date: 12/19/2016 CLINICAL DATA:  Acute onset of central chest pain and shortness of breath. Current history of ovarian cancer, status post recent chemotherapy. EXAM: CT ANGIOGRAPHY CHEST WITH CONTRAST TECHNIQUE: Multidetector CT imaging of the chest was performed using the standard protocol during bolus administration of intravenous contrast. Multiplanar CT image reconstructions and MIPs were obtained to evaluate the vascular anatomy. CONTRAST:  14mL ISOVUE-370 IOPAMIDOL (ISOVUE-370) INJECTION 76% COMPARISON:  Chest radiograph  performed earlier today at 1:41 a.m., and CTA of the chest performed 11/20/2016 FINDINGS: Cardiovascular:  There is no evidence of pulmonary embolus. The heart is borderline normal in size. Diffuse coronary artery calcifications are seen. Calcification is noted at the aortic valve. Scattered calcification is seen along the aortic arch and descending thoracic aorta, and at the great vessels. Borderline prominence of the ascending thoracic aorta is thought to be within normal limits given the patient's age. Mediastinum/Nodes: An enlarged subcarinal node is suggested, measuring 2.0 cm in short axis. Previously noted esophageal wall thickening has largely resolved. A small residual hiatal hernia is seen. No pericardial effusion is identified. The thyroid gland is grossly unremarkable. No axillary lymphadenopathy is seen. A right-sided chest port is noted. Lungs/Pleura: Small bilateral pleural effusions are noted, with associated bibasilar atelectasis. No pneumothorax is seen. No definite mass is identified. Upper Abdomen: The visualized portions of the liver and the spleen are unremarkable. Small stones are noted dependently within the gallbladder. The visualized portions of the adrenal glands and kidneys are grossly unremarkable. There is a 3.4 cm cystic mass at the tail of the pancreas, as noted on prior CT. Musculoskeletal: No acute osseous abnormalities are identified. Multilevel endplate sclerotic change is noted along the thoracic and upper lumbar spine, with vacuum phenomenon. The visualized musculature is unremarkable in appearance. Review  of the MIP images confirms the above findings. IMPRESSION: 1. No evidence of pulmonary embolus. 2. Small bilateral pleural effusions, with bibasilar atelectasis. 3. Diffuse coronary artery calcifications. 4. Enlarged subcarinal node, measuring 2.0 cm in short axis, may reflect the patient's malignancy or may be chronic in nature, though mildly more prominent than on the recent  prior CTA. 5. Small residual hiatal hernia noted. 6. Cholelithiasis.  Gallbladder otherwise unremarkable. 7. 3.4 cm cystic mass again noted at the tail of the pancreas; this is relatively stable in appearance. Electronically Signed   By: Garald Balding M.D.   On: 12/19/2016 04:20    EKG: Orders placed or performed during the hospital encounter of 12/19/16  . ED EKG within 10 minutes  . ED EKG within 10 minutes    IMPRESSION AND PLAN: 83 year old female patient with history of ovarian cancer, breast cancer, hypertension, hyperlipidemia presented to the emergency room with shortness of breath and chest pressure.  Admitting diagnosis 1. Non-ST elevation MI 2. Ovarian cancer 3. Hypertension 4. Hyperlipidemia Treatment plan Admit patient to telemetry Start patient on IV heparin drip for anti-coagulation Aspirin and nitrates Cardiology evaluation Check echocardiogram When necessary morphine for chest pain  All the records are reviewed and case discussed with ED provider. Management plans discussed with the patient, family and they are in agreement.  CODE STATUS:FULL CODE Code Status History    Date Active Date Inactive Code Status Order ID Comments User Context   11/20/2016 16:39 11/22/2016 19:50 Full Code [id number]  Idelle Crouch, MD ED       TOTAL TIME TAKING CARE OF THIS PATIENT: 52 minutes.    Saundra Shelling M.D on 12/19/2016 at 5:09 AM  Between 7am to 6pm - Pager - [phone]  After 6pm go to [email or web address] - password [email or web address] [facility]  [facility]  Office  [phone]  CC: Primary care physician; Adin Hector, MD

## 2016-12-19 NOTE — Consult Note (Signed)
[facility] Cardiology  CARDIOLOGY CONSULT NOTE  Patient ID: Ebony Navarro MRN: [id number] DOB/AGE: 1933-05-30 83 y.o.  Admit date: 12/19/2016 Referring Physician Leslye Peer Primary Physician [facility] Cardiologist  Reason for Consultation non-ST elevation myocardial infarction  HPI: 83 year old female referred for evaluation of non-ST elevation myocardial infarction.  The patient was recently hospitalized with shortness of breath and ascites 11/20/2016, found to have a right ovarian mass, malignant ascites, underwent paracentesis with resolution of symptoms.  The patient has undergone 2 cycles of chemotherapy.  2D echocardiogram 11/21/2016 revealed normal left ventricular function, with LVEF of 55-65%, with mild to moderate aortic stenosis.  The patient returns with recurrent shortness of breath, chest CT did not reveal evidence for pulmonary embolus with small bilateral pleural effusions.  Admission labs were notable for elevated troponin of 3.16 with follow-up 6.72 in the absence of chest pain.  ECG revealed sinus rhythm with nonspecific ST-T wave abnormalities.  The patient was started on a heparin drip however developed coffee-ground emesis and heparin was discontinued.  The patient currently denies chest pain.  Review of systems complete and found to be negative unless listed above     Past Medical History:  Diagnosis Date  . Breast cancer ([place]) 2005   Right, radiation and lumpectomy  . Breast cancer ([place]) 2002   Left, Chemo, radiation and lumpectomy  . Cancer ([place])   . Hyperlipidemia   . Hypertension   . Ovarian cancer ([place]) 11/24/2016    Past Surgical History:  Procedure Laterality Date  . BREAST BIOPSY Left 2010   negative stereotactic biopsy  . BREAST LUMPECTOMY Right 2005   positive  . BREAST LUMPECTOMY Left 2002   positive    Medications Prior to Admission  Medication Sig Dispense Refill Last Dose  . atorvastatin (LIPITOR) 40 MG tablet Take 40 mg by mouth daily.     12/18/2016 at Unknown time  . dexamethasone (DECADRON) 4 MG tablet Take 8 mg daily by mouth. Start the day after chemotherapy for 2 days   12/18/2016 at Unknown time  . diltiazem (CARDIZEM CD) 120 MG 24 hr capsule TAKE 120 MG BY MOUTH EVERY OTHER DAY   12/18/2016 at Unknown time  . levothyroxine (SYNTHROID, LEVOTHROID) 50 MCG tablet TAKE 1 TABLET BY MOUTH DAILY ON AN EMPTY STOMACH WITH A GLASS OF WATER 30-60 MIN BEFORE BREAKFAST   12/18/2016 at Unknown time  . lidocaine-prilocaine (EMLA) cream Apply to affected area once 30 g 3 prn at prn  . LORazepam (ATIVAN) 0.5 MG tablet Take 1 tablet (0.5 mg total) by mouth every 6 (six) hours as needed (Nausea or vomiting). 30 tablet 0 prn at prn  . megestrol (MEGACE) 20 MG tablet Take 1 tablet (20 mg total) by mouth daily. 30 tablet 0 Unknown at Unknown  . ondansetron (ZOFRAN) 8 MG tablet Take 1 tablet (8 mg total) by mouth 2 (two) times daily as needed for refractory nausea / vomiting. Start on day 3 after chemo. 30 tablet 1 prn at prn  . prochlorperazine (COMPAZINE) 10 MG tablet Take 1 tablet (10 mg total) by mouth every 6 (six) hours as needed (Nausea or vomiting). 30 tablet 1 prn at prn  . promethazine (PHENERGAN) 25 MG tablet Take 1 tablet (25 mg total) by mouth every 6 (six) hours as needed for nausea or vomiting. 20 tablet 0 prn at prn   Social History   Socioeconomic History  . Marital status: Widowed    Spouse name: Not on file  . Number of children:  Not on file  . Years of education: Not on file  . Highest education level: Not on file  Social Needs  . Financial resource strain: Not on file  . Food insecurity - worry: Not on file  . Food insecurity - inability: Not on file  . Transportation needs - medical: Not on file  . Transportation needs - non-medical: Not on file  Occupational History  . Occupation: retired  Tobacco Use  . Smoking status: Never Smoker  . Smokeless tobacco: Never Used  Substance and Sexual Activity  . Alcohol use:  No  . Drug use: No  . Sexual activity: No    Birth control/protection: Post-menopausal  Other Topics Concern  . Not on file  Social History Narrative  . Not on file    Family History  Problem Relation Age of Onset  . Hypertension Mother   . Hypertension Father       Review of systems complete and found to be negative unless listed above      PHYSICAL EXAM  General: Well developed, well nourished, in no acute distress HEENT:  Normocephalic and atramatic Neck:  No JVD.  Lungs: Clear bilaterally to auscultation and percussion. Heart: HRRR . Normal S1 and S2 without gallops or murmurs.  Abdomen: Bowel sounds are positive, abdomen soft and non-tender  Msk:  Back normal, normal gait. Normal strength and tone for age. Extremities: No clubbing, cyanosis or edema.   Neuro: Alert and oriented X 3. Psych:  Good affect, responds appropriately  Labs:   Lab Results  Component Value Date   WBC 11.8 (H) 12/19/2016   HGB 12.1 12/19/2016   HCT 37.3 12/19/2016   MCV 93.1 12/19/2016   PLT 228 12/19/2016    Recent Labs  Lab 12/17/16 0840  12/19/16 0525  NA 134*   < > 133*  K 4.4   < > 4.7  CL 103   < > 104  CO2 23   < > 20*  BUN 29*   < > 43*  CREATININE 0.96   < > 1.03*  CALCIUM 9.1   < > 8.4*  PROT 7.7  --   --   BILITOT 0.9  --   --   ALKPHOS 72  --   --   ALT 14  --   --   AST 27  --   --   GLUCOSE 146*   < > 161*   < > = values in this interval not displayed.   Lab Results  Component Value Date   TROPONINI 6.72 [facility]) 12/19/2016    Lab Results  Component Value Date   CHOL 154 12/19/2016   Lab Results  Component Value Date   HDL 52 12/19/2016   Lab Results  Component Value Date   LDLCALC 82 12/19/2016   Lab Results  Component Value Date   TRIG 101 12/19/2016   Lab Results  Component Value Date   CHOLHDL 3.0 12/19/2016   No results found for: LDLDIRECT    Radiology: Dg Chest 2 View  Result Date: 12/19/2016 CLINICAL DATA:  Central chest pain and  shortness of breath yesterday. History of ovarian cancer, breast cancer, on chemotherapy. EXAM: CHEST  2 VIEW COMPARISON:  CT chest November 20, 2016 FINDINGS: The cardiac silhouette is mildly enlarged and unchanged. Calcified aortic knob. Small pleural effusions. No focal consolidation. RIGHT single-lumen chest Port-A-Cath distal tip projects in mid superior vena cava. No pneumothorax. Soft tissue planes and included osseous structures are nonsuspicious. Calcifications  in the neck are likely vascular. Osteopenia. IMPRESSION: Mild cardiomegaly.  Small pleural effusions. Aortic Atherosclerosis (ICD10-I70.0). Electronically Signed   By: Elon Alas M.D.   On: 12/19/2016 02:00   Dg Chest 2 View  Result Date: 11/20/2016 CLINICAL DATA:  Increasing shortness of breath over the past month. EXAM: CHEST  2 VIEW COMPARISON:  Chest x-ray dated August 27, 2012. FINDINGS: The cardiomediastinal silhouette is normal in size. Normal pulmonary vascularity. Trace bilateral pleural effusions. No focal consolidation or pneumothorax. No acute osseous abnormality. IMPRESSION: Trace bilateral pleural effusions. Electronically Signed   By: Titus Dubin M.D.   On: 11/20/2016 09:45   Ct Angio Chest Pe W/cm &/or Wo Cm  Result Date: 12/19/2016 CLINICAL DATA:  Acute onset of central chest pain and shortness of breath. Current history of ovarian cancer, status post recent chemotherapy. EXAM: CT ANGIOGRAPHY CHEST WITH CONTRAST TECHNIQUE: Multidetector CT imaging of the chest was performed using the standard protocol during bolus administration of intravenous contrast. Multiplanar CT image reconstructions and MIPs were obtained to evaluate the vascular anatomy. CONTRAST:  81mL ISOVUE-370 IOPAMIDOL (ISOVUE-370) INJECTION 76% COMPARISON:  Chest radiograph performed earlier today at 1:41 a.m., and CTA of the chest performed 11/20/2016 FINDINGS: Cardiovascular:  There is no evidence of pulmonary embolus. The heart is borderline normal  in size. Diffuse coronary artery calcifications are seen. Calcification is noted at the aortic valve. Scattered calcification is seen along the aortic arch and descending thoracic aorta, and at the great vessels. Borderline prominence of the ascending thoracic aorta is thought to be within normal limits given the patient's age. Mediastinum/Nodes: An enlarged subcarinal node is suggested, measuring 2.0 cm in short axis. Previously noted esophageal wall thickening has largely resolved. A small residual hiatal hernia is seen. No pericardial effusion is identified. The thyroid gland is grossly unremarkable. No axillary lymphadenopathy is seen. A right-sided chest port is noted. Lungs/Pleura: Small bilateral pleural effusions are noted, with associated bibasilar atelectasis. No pneumothorax is seen. No definite mass is identified. Upper Abdomen: The visualized portions of the liver and the spleen are unremarkable. Small stones are noted dependently within the gallbladder. The visualized portions of the adrenal glands and kidneys are grossly unremarkable. There is a 3.4 cm cystic mass at the tail of the pancreas, as noted on prior CT. Musculoskeletal: No acute osseous abnormalities are identified. Multilevel endplate sclerotic change is noted along the thoracic and upper lumbar spine, with vacuum phenomenon. The visualized musculature is unremarkable in appearance. Review of the MIP images confirms the above findings. IMPRESSION: 1. No evidence of pulmonary embolus. 2. Small bilateral pleural effusions, with bibasilar atelectasis. 3. Diffuse coronary artery calcifications. 4. Enlarged subcarinal node, measuring 2.0 cm in short axis, may reflect the patient's malignancy or may be chronic in nature, though mildly more prominent than on the recent prior CTA. 5. Small residual hiatal hernia noted. 6. Cholelithiasis.  Gallbladder otherwise unremarkable. 7. 3.4 cm cystic mass again noted at the tail of the pancreas; this is  relatively stable in appearance. Electronically Signed   By: Garald Balding M.D.   On: 12/19/2016 04:20   Ct Angio Chest Pe W And/or Wo Contrast  Result Date: 11/20/2016 CLINICAL DATA:  Shortness of breath.  Abdominal distension. EXAM: CT ANGIOGRAPHY CHEST CT ABDOMEN AND PELVIS WITH CONTRAST TECHNIQUE: Multidetector CT imaging of the chest was performed using the standard protocol during bolus administration of intravenous contrast. Multiplanar CT image reconstructions and MIPs were obtained to evaluate the vascular anatomy. Multidetector CT imaging of the  abdomen and pelvis was performed using the standard protocol during bolus administration of intravenous contrast. CONTRAST:  75 mL of Isovue 370 intravenously. COMPARISON:  Radiographs of same day.  CT scan of Jun 21, 2013. FINDINGS: CTA CHEST FINDINGS Cardiovascular: Satisfactory opacification of the pulmonary arteries to the segmental level. No evidence of pulmonary embolism. Normal heart size. No pericardial effusion. Atherosclerosis of thoracic aorta is noted without aneurysm formation. Coronary artery calcifications are noted. Mediastinum/Nodes: Moderate size sliding-type hiatal hernia is noted. Wall thickening of distal esophagus is noted concerning for inflammation. Thyroid gland is unremarkable. No mediastinal adenopathy is noted. Lungs/Pleura: No pneumothorax is noted. Mild bilateral pleural effusions are noted with adjacent subsegmental atelectasis. Musculoskeletal: No chest wall abnormality. No acute or significant osseous findings. Postsurgical changes are seen involving both breasts. Review of the MIP images confirms the above findings. CT ABDOMEN and PELVIS FINDINGS Hepatobiliary: Minimal cholelithiasis is noted. No abnormality is seen in the liver. Pancreas: Stable 3.3 cm cystic lesion seen arising from pancreatic tail. No ductal dilatation or inflammation is noted. Spleen: Normal in size without focal abnormality. Adrenals/Urinary Tract:  Adrenal glands appear normal. Stable bilateral renal cysts are noted. No hydronephrosis or renal obstruction is noted. No renal or ureteral calculi are noted. Urinary bladder is mildly distended, with air-fluid level present superiorly, suggesting recent instrumentation. Stomach/Bowel: Stomach is within normal limits. Appendix appears normal. No evidence of bowel wall thickening, distention, or inflammatory changes. Vascular/Lymphatic: Aortic atherosclerosis. No enlarged abdominal or pelvic lymph nodes. Reproductive: 7.1 x 4.7 cm lobulated and heterogeneously enhancing right adnexal mass is noted concerning for ovarian malignancy. Uterus and left ovary are unremarkable. Other: Moderate ascites is noted.  No definite hernia is noted. Musculoskeletal: No acute or significant osseous findings. Review of the MIP images confirms the above findings. IMPRESSION: No definite evidence of pulmonary embolus. 7.1 x 4.4 cm lobulated right adnexal mass is noted concerning for ovarian malignancy. Moderate ascites is noted. Aortic atherosclerosis. Minimal cholelithiasis. Moderate size sliding-type hiatal hernia is noted. Moderate wall thickening of distal esophagus is noted concerning for inflammation or neoplasm. Endoscopy is recommended for further evaluation. Stable 3.3 cm cystic lesion arising from pancreatic tail. Coronary artery calcifications are noted suggesting coronary artery disease. Electronically Signed   By: Marijo Conception, M.D.   On: 11/20/2016 13:31   Ct Abdomen Pelvis W Contrast  Result Date: 11/20/2016 CLINICAL DATA:  Shortness of breath.  Abdominal distension. EXAM: CT ANGIOGRAPHY CHEST CT ABDOMEN AND PELVIS WITH CONTRAST TECHNIQUE: Multidetector CT imaging of the chest was performed using the standard protocol during bolus administration of intravenous contrast. Multiplanar CT image reconstructions and MIPs were obtained to evaluate the vascular anatomy. Multidetector CT imaging of the abdomen and pelvis  was performed using the standard protocol during bolus administration of intravenous contrast. CONTRAST:  75 mL of Isovue 370 intravenously. COMPARISON:  Radiographs of same day.  CT scan of Jun 21, 2013. FINDINGS: CTA CHEST FINDINGS Cardiovascular: Satisfactory opacification of the pulmonary arteries to the segmental level. No evidence of pulmonary embolism. Normal heart size. No pericardial effusion. Atherosclerosis of thoracic aorta is noted without aneurysm formation. Coronary artery calcifications are noted. Mediastinum/Nodes: Moderate size sliding-type hiatal hernia is noted. Wall thickening of distal esophagus is noted concerning for inflammation. Thyroid gland is unremarkable. No mediastinal adenopathy is noted. Lungs/Pleura: No pneumothorax is noted. Mild bilateral pleural effusions are noted with adjacent subsegmental atelectasis. Musculoskeletal: No chest wall abnormality. No acute or significant osseous findings. Postsurgical changes are seen involving both breasts. Review  of the MIP images confirms the above findings. CT ABDOMEN and PELVIS FINDINGS Hepatobiliary: Minimal cholelithiasis is noted. No abnormality is seen in the liver. Pancreas: Stable 3.3 cm cystic lesion seen arising from pancreatic tail. No ductal dilatation or inflammation is noted. Spleen: Normal in size without focal abnormality. Adrenals/Urinary Tract: Adrenal glands appear normal. Stable bilateral renal cysts are noted. No hydronephrosis or renal obstruction is noted. No renal or ureteral calculi are noted. Urinary bladder is mildly distended, with air-fluid level present superiorly, suggesting recent instrumentation. Stomach/Bowel: Stomach is within normal limits. Appendix appears normal. No evidence of bowel wall thickening, distention, or inflammatory changes. Vascular/Lymphatic: Aortic atherosclerosis. No enlarged abdominal or pelvic lymph nodes. Reproductive: 7.1 x 4.7 cm lobulated and heterogeneously enhancing right adnexal  mass is noted concerning for ovarian malignancy. Uterus and left ovary are unremarkable. Other: Moderate ascites is noted.  No definite hernia is noted. Musculoskeletal: No acute or significant osseous findings. Review of the MIP images confirms the above findings. IMPRESSION: No definite evidence of pulmonary embolus. 7.1 x 4.4 cm lobulated right adnexal mass is noted concerning for ovarian malignancy. Moderate ascites is noted. Aortic atherosclerosis. Minimal cholelithiasis. Moderate size sliding-type hiatal hernia is noted. Moderate wall thickening of distal esophagus is noted concerning for inflammation or neoplasm. Endoscopy is recommended for further evaluation. Stable 3.3 cm cystic lesion arising from pancreatic tail. Coronary artery calcifications are noted suggesting coronary artery disease. Electronically Signed   By: Marijo Conception, M.D.   On: 11/20/2016 13:31   US Paracentesis  Result Date: 11/22/2016 INDICATION: Ascites EXAM: ULTRASOUND GUIDED PARACENTESIS MEDICATIONS: None. COMPLICATIONS: None immediate. PROCEDURE: Informed written consent was obtained from the patient after a discussion of the risks, benefits and alternatives to treatment. A timeout was performed prior to the initiation of the procedure. Initial ultrasound scanning demonstrates a large amount of ascites within the right lower abdominal quadrant. The right lower abdomen was prepped and draped in the usual sterile fashion. 1% lidocaine was used for local anesthesia. Following this, a 6 Fr Safe-T-Centesis catheter was introduced. An ultrasound image was saved for documentation purposes. The paracentesis was performed. The catheter was removed and a dressing was applied. The patient tolerated the procedure well without immediate post procedural complication. FINDINGS: A total of approximately 4.7 L of yellow fluid was removed. Samples were sent to the laboratory as requested by the clinical team. IMPRESSION: Successful  ultrasound-guided paracentesis yielding 4.7 liters of peritoneal fluid. Electronically Signed   By: Inez Catalina M.D.   On: 11/22/2016 12:15    EKG: Normal sinus rhythm, nonspecific ST-T wave abnormalities  ASSESSMENT AND PLAN:   1.  Non-ST elevation myocardial infarction, in the absence of chest pain, without ischemic ST-T wave changes on ECG, started on heparin drip, developed coffee-ground emesis and heparin discontinued. 2.  Newly diagnosed ovarian cancer, with metastatic ascites, status post paracentesis, with recent chemotherapy  Recommendations  1.  DC heparin 2.  Initial conservative management 3.  Start low-dose metoprolol tartrate 12.5 mg twice daily 4.  Await GI consult 5.  Defer cardiac catheterization at this time in the absence of chest pain or ischemic ECG changes  Signed: Isaias Cowman MD,PhD, [facility] 12/19/2016, 10:17 AM

## 2016-12-19 NOTE — ED Notes (Signed)
Patient transported to X-ray 

## 2016-12-19 NOTE — Progress Notes (Signed)
Troponins 10.05 CCMD notified that patient had a 5 bt run of vtach. Dr. Leslye Peer notified orders for 2 gm IV Magnesium ordered. Will proceed and continue to monitor.

## 2016-12-19 NOTE — ED Notes (Addendum)
Patient oxygen decreased to 2L [place] and holding steady at 95%.

## 2016-12-20 ENCOUNTER — Encounter: Payer: Self-pay | Admitting: Oncology

## 2016-12-20 ENCOUNTER — Telehealth: Payer: Self-pay | Admitting: Genetic Counselor

## 2016-12-20 ENCOUNTER — Inpatient Hospital Stay: Payer: Medicare Other

## 2016-12-20 ENCOUNTER — Other Ambulatory Visit: Payer: Self-pay | Admitting: *Deleted

## 2016-12-20 ENCOUNTER — Inpatient Hospital Stay
Admit: 2016-12-20 | Discharge: 2016-12-20 | Disposition: A | Discharge status: Home / Self Care | Payer: Medicare Other | Attending: Internal Medicine | Admitting: Internal Medicine

## 2016-12-20 DIAGNOSIS — J9601 Acute respiratory failure with hypoxia: Secondary | ICD-10-CM

## 2016-12-20 DIAGNOSIS — R180 Malignant ascites: Secondary | ICD-10-CM

## 2016-12-20 DIAGNOSIS — Z923 Personal history of irradiation: Secondary | ICD-10-CM

## 2016-12-20 DIAGNOSIS — I10 Essential (primary) hypertension: Secondary | ICD-10-CM

## 2016-12-20 DIAGNOSIS — Z853 Personal history of malignant neoplasm of breast: Secondary | ICD-10-CM

## 2016-12-20 DIAGNOSIS — I959 Hypotension, unspecified: Secondary | ICD-10-CM

## 2016-12-20 DIAGNOSIS — I4891 Unspecified atrial fibrillation: Secondary | ICD-10-CM

## 2016-12-20 DIAGNOSIS — E785 Hyperlipidemia, unspecified: Secondary | ICD-10-CM

## 2016-12-20 DIAGNOSIS — D709 Neutropenia, unspecified: Secondary | ICD-10-CM

## 2016-12-20 DIAGNOSIS — R0602 Shortness of breath: Secondary | ICD-10-CM

## 2016-12-20 DIAGNOSIS — C569 Malignant neoplasm of unspecified ovary: Secondary | ICD-10-CM

## 2016-12-20 DIAGNOSIS — Z66 Do not resuscitate: Secondary | ICD-10-CM

## 2016-12-20 DIAGNOSIS — Z79899 Other long term (current) drug therapy: Secondary | ICD-10-CM

## 2016-12-20 DIAGNOSIS — D649 Anemia, unspecified: Secondary | ICD-10-CM

## 2016-12-20 DIAGNOSIS — D696 Thrombocytopenia, unspecified: Secondary | ICD-10-CM

## 2016-12-20 LAB — ECHOCARDIOGRAM COMPLETE
Height: 67 in
WEIGHTICAEL: 2555.2 [oz_av]

## 2016-12-20 LAB — CBC
HEMATOCRIT: 34.4 % — AB (ref 35.0–47.0)
HEMOGLOBIN: 11.3 g/dL — AB (ref 12.0–16.0)
MCH: 30.5 pg (ref 26.0–34.0)
MCHC: 33 g/dL (ref 32.0–36.0)
MCV: 92.6 fL (ref 80.0–100.0)
PLATELETS: 176 10*3/uL (ref 150–440)
RBC: 3.71 MIL/uL — AB (ref 3.80–5.20)
RDW: 15.9 % — ABNORMAL HIGH (ref 11.5–14.5)
WBC: 8.6 10*3/uL (ref 3.6–11.0)

## 2016-12-20 LAB — BASIC METABOLIC PANEL
ANION GAP: 10 (ref 5–15)
BUN: 55 mg/dL — ABNORMAL HIGH (ref 6–20)
CHLORIDE: 102 mmol/L (ref 101–111)
CO2: 20 mmol/L — AB (ref 22–32)
Calcium: 7.9 mg/dL — ABNORMAL LOW (ref 8.9–10.3)
Creatinine, Ser: 1.18 mg/dL — ABNORMAL HIGH (ref 0.44–1.00)
GFR calc non Af Amer: 42 mL/min — ABNORMAL LOW (ref >60)
GFR, EST AFRICAN AMERICAN: 49 mL/min — AB (ref >60)
GLUCOSE: 155 mg/dL — AB (ref 65–99)
Potassium: 4.8 mmol/L (ref 3.5–5.1)
Sodium: 132 mmol/L — ABNORMAL LOW (ref 135–145)

## 2016-12-20 MED ORDER — FUROSEMIDE 40 MG PO TABS
40.0000 mg | ORAL_TABLET | Freq: Every day | ORAL | Status: DC
  Administered 2016-12-20 – 2016-12-26 (×7): 40 mg via ORAL
  Filled 2016-12-20 (×2): qty 1
  Filled 2016-12-20: qty 2
  Filled 2016-12-20: qty 1
  Filled 2016-12-20 (×2): qty 2
  Filled 2016-12-20: qty 1

## 2016-12-20 MED ORDER — PIPERACILLIN-TAZOBACTAM 3.375 G IVPB
3.3750 g | Freq: Three times a day (TID) | INTRAVENOUS | Status: DC
  Administered 2016-12-20 – 2016-12-22 (×7): 3.375 g via INTRAVENOUS
  Filled 2016-12-20 (×7): qty 50

## 2016-12-20 MED ORDER — ENSURE ENLIVE PO LIQD
237.0000 mL | Freq: Two times a day (BID) | ORAL | Status: DC
  Administered 2016-12-21 – 2016-12-31 (×13): 237 mL via ORAL

## 2016-12-20 MED ORDER — FUROSEMIDE 10 MG/ML IJ SOLN
40.0000 mg | Freq: Once | INTRAMUSCULAR | Status: AC
  Administered 2016-12-20: 40 mg via INTRAVENOUS

## 2016-12-20 MED ORDER — ASPIRIN EC 81 MG PO TBEC
81.0000 mg | DELAYED_RELEASE_TABLET | Freq: Every day | ORAL | Status: DC
  Administered 2016-12-20 – 2017-01-01 (×13): 81 mg via ORAL
  Filled 2016-12-20 (×13): qty 1

## 2016-12-20 MED ORDER — FUROSEMIDE 10 MG/ML IJ SOLN
INTRAMUSCULAR | Status: AC
  Filled 2016-12-20: qty 4

## 2016-12-20 MED ORDER — METOPROLOL TARTRATE 25 MG PO TABS
25.0000 mg | ORAL_TABLET | Freq: Two times a day (BID) | ORAL | Status: DC
  Administered 2016-12-20 – 2016-12-22 (×3): 25 mg via ORAL
  Filled 2016-12-20 (×5): qty 1

## 2016-12-20 MED ORDER — BUDESONIDE 0.5 MG/2ML IN SUSP
0.5000 mg | Freq: Two times a day (BID) | RESPIRATORY_TRACT | Status: DC
  Administered 2016-12-20 – 2017-01-01 (×25): 0.5 mg via RESPIRATORY_TRACT
  Filled 2016-12-20 (×26): qty 2

## 2016-12-20 MED ORDER — PANTOPRAZOLE SODIUM 40 MG IV SOLR
40.0000 mg | Freq: Two times a day (BID) | INTRAVENOUS | Status: DC
  Administered 2016-12-20 – 2016-12-22 (×4): 40 mg via INTRAVENOUS
  Filled 2016-12-20 (×4): qty 40

## 2016-12-20 MED ORDER — LOSARTAN POTASSIUM 50 MG PO TABS
25.0000 mg | ORAL_TABLET | Freq: Every day | ORAL | Status: DC
  Administered 2016-12-21 – 2016-12-22 (×2): 25 mg via ORAL
  Filled 2016-12-20 (×2): qty 1

## 2016-12-20 MED ORDER — ACETAMINOPHEN 325 MG PO TABS
650.0000 mg | ORAL_TABLET | ORAL | Status: DC | PRN
  Administered 2016-12-22 – 2016-12-30 (×3): 650 mg via ORAL
  Filled 2016-12-20 (×3): qty 2

## 2016-12-20 MED ORDER — ADULT MULTIVITAMIN W/MINERALS CH
1.0000 | ORAL_TABLET | Freq: Every day | ORAL | Status: DC
  Administered 2016-12-20 – 2017-01-01 (×12): 1 via ORAL
  Filled 2016-12-20 (×13): qty 1

## 2016-12-20 MED ORDER — IPRATROPIUM-ALBUTEROL 0.5-2.5 (3) MG/3ML IN SOLN
3.0000 mL | Freq: Four times a day (QID) | RESPIRATORY_TRACT | Status: DC
  Administered 2016-12-20 – 2016-12-22 (×8): 3 mL via RESPIRATORY_TRACT
  Filled 2016-12-20 (×8): qty 3

## 2016-12-20 MED ORDER — SUCRALFATE 1 GM/10ML PO SUSP
1.0000 g | Freq: Three times a day (TID) | ORAL | Status: DC
  Administered 2016-12-20 – 2016-12-23 (×11): 1 g via ORAL
  Filled 2016-12-20 (×15): qty 10

## 2016-12-20 NOTE — Telephone Encounter (Signed)
I left a message for Ms. Saez to call and get her genetic counseling phone appointment scheduled. Referral by Dr. Tasia Catchings for personal history of breast and ovarian cancer.   Steele Berg, [place], [place] Genetic Counselor Phone: [phone]

## 2016-12-20 NOTE — Progress Notes (Signed)
Patient ID: Ebony Navarro, female   DOB: [birth year], 83 y.o.   MRN: [id number]  ACP note  Patient and daughter at the bedside  Diagnosis: Stage III ovarian cancer, acute hypoxic respiratory failure, GI bleed, and STEMI.  All of these issues happening recently for this patient.  CODE STATUS discussed.  Patient and daughter would like to be full code at this time.  Time spent on ACP discussion 17 minutes  Dr. Loletha Grayer

## 2016-12-20 NOTE — Progress Notes (Signed)
MD Pyreddy made aware that pt is c/o SOB and O2 sats are in the 80's on 7L . Orders to stop the fluids, put pt on nonrebreathing mask, and give 40 mg of IV lasix received. Will continue to monitor.

## 2016-12-20 NOTE — Progress Notes (Signed)
Jonathon Bellows , MD [address] [place], [place], [place], [id number], [place], [place], [place], [address] Phone: [phone]  Fax: [phone]   Ebony Navarro is being followed for upper GI bleed  Day 1 of follow up   Subjective: No complaints, brown stool    Objective: Vital signs in last 24 hours: Vitals:   12/20/16 0729 12/20/16 0730 12/20/16 0733 12/20/16 0847  BP:    116/69  Pulse: (!) 115  (!) 114 (!) 117  Resp:    (!) 28  Temp:      TempSrc:      SpO2: (!) 89% (!) 89% 94% 93%  Weight:      Height:       Weight change:   Intake/Output Summary (Last 24 hours) at 12/20/2016 0855 Last data filed at 12/20/2016 3154 Gross per 24 hour  Intake 860 ml  Output 1351 ml  Net -491 ml     Exam: Heart:: Regular rate and rhythm, S1S2 present or without murmur or extra heart sounds Lungs: normal, clear to auscultation and clear to auscultation and percussion Abdomen: soft, nontender, normal bowel sounds   Lab Results: @LABTEST2 @ Micro Results: No results found for this or any previous visit (from the past 240 hour(s)). Studies/Results: Dg Chest 2 View  Result Date: 12/19/2016 CLINICAL DATA:  Central chest pain and shortness of breath yesterday. History of ovarian cancer, breast cancer, on chemotherapy. EXAM: CHEST  2 VIEW COMPARISON:  CT chest November 20, 2016 FINDINGS: The cardiac silhouette is mildly enlarged and unchanged. Calcified aortic knob. Small pleural effusions. No focal consolidation. RIGHT single-lumen chest Port-A-Cath distal tip projects in mid superior vena cava. No pneumothorax. Soft tissue planes and included osseous structures are nonsuspicious. Calcifications in the neck are likely vascular. Osteopenia. IMPRESSION: Mild cardiomegaly.  Small pleural effusions. Aortic Atherosclerosis (ICD10-I70.0). Electronically Signed   By: Elon Alas M.D.   On: 12/19/2016 02:00   Ct Angio Chest Pe W/cm &/or Wo Cm  Result Date:  12/19/2016 CLINICAL DATA:  Acute onset of central chest pain and shortness of breath. Current history of ovarian cancer, status post recent chemotherapy. EXAM: CT ANGIOGRAPHY CHEST WITH CONTRAST TECHNIQUE: Multidetector CT imaging of the chest was performed using the standard protocol during bolus administration of intravenous contrast. Multiplanar CT image reconstructions and MIPs were obtained to evaluate the vascular anatomy. CONTRAST:  81mL ISOVUE-370 IOPAMIDOL (ISOVUE-370) INJECTION 76% COMPARISON:  Chest radiograph performed earlier today at 1:41 a.m., and CTA of the chest performed 11/20/2016 FINDINGS: Cardiovascular:  There is no evidence of pulmonary embolus. The heart is borderline normal in size. Diffuse coronary artery calcifications are seen. Calcification is noted at the aortic valve. Scattered calcification is seen along the aortic arch and descending thoracic aorta, and at the great vessels. Borderline prominence of the ascending thoracic aorta is thought to be within normal limits given the patient's age. Mediastinum/Nodes: An enlarged subcarinal node is suggested, measuring 2.0 cm in short axis. Previously noted esophageal wall thickening has largely resolved. A small residual hiatal hernia is seen. No pericardial effusion is identified. The thyroid gland is grossly unremarkable. No axillary lymphadenopathy is seen. A right-sided chest port is noted. Lungs/Pleura: Small bilateral pleural effusions are noted, with associated bibasilar atelectasis. No pneumothorax is seen. No definite mass is identified. Upper Abdomen: The visualized portions of the liver and the spleen are unremarkable. Small stones are noted dependently within the gallbladder. The visualized portions of the adrenal glands and kidneys are grossly  unremarkable. There is a 3.4 cm cystic mass at the tail of the pancreas, as noted on prior CT. Musculoskeletal: No acute osseous abnormalities are identified. Multilevel endplate sclerotic  change is noted along the thoracic and upper lumbar spine, with vacuum phenomenon. The visualized musculature is unremarkable in appearance. Review of the MIP images confirms the above findings. IMPRESSION: 1. No evidence of pulmonary embolus. 2. Small bilateral pleural effusions, with bibasilar atelectasis. 3. Diffuse coronary artery calcifications. 4. Enlarged subcarinal node, measuring 2.0 cm in short axis, may reflect the patient's malignancy or may be chronic in nature, though mildly more prominent than on the recent prior CTA. 5. Small residual hiatal hernia noted. 6. Cholelithiasis.  Gallbladder otherwise unremarkable. 7. 3.4 cm cystic mass again noted at the tail of the pancreas; this is relatively stable in appearance. Electronically Signed   By: Garald Balding M.D.   On: 12/19/2016 04:20   Medications: I have reviewed the patient's current medications. Scheduled Meds: . atorvastatin  40 mg Oral Daily  . levothyroxine  50 mcg Oral QAC breakfast  . megestrol  20 mg Oral Daily  . metoprolol tartrate  12.5 mg Oral BID  . nitroGLYCERIN  1 inch Topical Q6H  . [START ON 12/22/2016] pantoprazole  40 mg Intravenous Q12H  . sodium chloride flush  3 mL Intravenous Q12H   Continuous Infusions: . sodium chloride    . sodium chloride 25 mL/hr at 12/19/16 1432  . pantoprozole (PROTONIX) infusion 8 mg/hr (12/19/16 2347)   PRN Meds:.sodium chloride, acetaminophen, morphine injection, nitroGLYCERIN, ondansetron (ZOFRAN) IV, sodium chloride flush   Assessment: Active Problems:   Non-STEMI (non-ST elevated myocardial infarction) [facility])  Ebony Navarro is a 83 y.o. y/o female admitted last night with NSTEMI , overnight commenced on IV heparin, had an episode of coffee ground emesis. Likely upper GI bleed. Overnight drop in oxygen saturations. Hb has been stable -at her baseline on admission day before .   Plan  1. IV PPI 2. Monitor CBC 3. I feel if she has no overt signs of bleeding may be  reasonable to start atleast on a low dose asprin- benefits likely exceed the risks, ensure on PPI and carafate       LOS: 1 day   Jonathon Bellows, MD 12/20/2016, 8:55 AM

## 2016-12-20 NOTE — Care Management Note (Signed)
Case Management Note  Patient Details  Name: Ebony Navarro MRN: [id number] Date of Birth: 22-Feb-1933  Subjective/Objective:                 Patient presents from home with nstemi.  Current 02 requirement is acute.  AT present she is requiring high flow nasal cannula. Prior to this admission, she lived alone at home and independent in her adls.  current with pcp- Ramonita Lab.  No issues accessing medical cal or obtaining her meds. Recent chemo for breast cancer. Does not use or have access to a walker.  If physical therapy recommends skilled nursing placement, patient says she would consider the option if it "is really needed."    Action/Plan:    Expected Discharge Date:                  Expected Discharge Plan:     In-House Referral:     Discharge planning Services     Post Acute Care Choice:    Choice offered to:     DME Arranged:    DME Agency:     HH Arranged:    [place] Agency:     Status of Service:     If discussed at [employer], dates discussed:    Additional Comments:  Katrina Stack, RN 12/20/2016, 2:40 PM

## 2016-12-20 NOTE — Progress Notes (Signed)
Pharmacy Antibiotic Note  Ebony Navarro is a 83 y.o. female admitted on 12/19/2016 with aspiration pneumonia.  Pharmacy has been consulted for Zosyn dosing. CXR with acute multifocal infection versus aspiration. Holding off on vancomycin for now per hospitalist and seeing how pt does.   Plan: Zosyn 3.375g IV q8h (4 hour infusion).  Height: 5\' 7"  (170.2 cm) Weight: 159 lb 11.2 oz (72.4 kg) IBW/kg (Calculated) : 61.6  Temp (24hrs), Avg:98.4 F (36.9 C), Min:98 F (36.7 C), Max:99.6 F (37.6 C)  Recent Labs  Lab 12/17/16 0840 12/19/16 0212 12/19/16 0525 12/20/16 0540  WBC 5.2 11.7* 11.8* 8.6  CREATININE 0.96 0.92 1.03* 1.18*    Estimated Creatinine Clearance: 36.4 mL/min (A) (by C-G formula based on SCr of 1.18 mg/dL (H)).    Allergies  Allergen Reactions  . Ace Inhibitors Other (See Comments)    hyperkalemia  . Atenolol Other (See Comments)    Worsening palpitations  . Iodine Other (See Comments)    11/20/16: per conversation with pt, pt with allergy to topical iodine and betadine.  Pt states she has had IV contrast in the past with now issues.  . Metoprolol Tartrate Other (See Comments)    intolerant  . Omeprazole Diarrhea  . Povidone-Iodine Other (See Comments)  . Rofecoxib Nausea Only  . Ciprofloxacin Rash  . Doxycycline Calcium Rash  . Nitrofurantoin Rash  . Quinolones Rash  . Sulfa Antibiotics Rash    Antimicrobials this admission: Zosyn 11/12 >>  Dose adjustments this admission:   Microbiology results:  Thank you for allowing pharmacy to be a part of this patient's care.  Rocky Morel 12/20/2016 3:34 PM

## 2016-12-20 NOTE — Consult Note (Signed)
Hematology/Oncology Consult note [facility] [phone] [phone]  Patient Care Team: Adin Hector, MD as PCP - General (Internal Medicine) Clent Jacks, RN as Registered Nurse   Name of the patient: Ebony Navarro  [id number]  1933/02/20   Date of visit: 12/20/16 REASON FOR COSULTATION:  Ovarian cancer. History of presenting illness-  Ms. Parfitt is 83 year old female who has history of breast cancer, and recently diagnosed stage III ovarian cancer, status post 2 cycles of carboplatinum plus Taxol plus Avastin. She was last seen by me on 12/17/2016. She tolerated treatment well. Her malignant ascites has also improved. Her tumor marker CA-125 has significantly dropped from 2600s to 400s. She reports she tolerated her chemotherapy on 12/17/2016. However the next day she started to feel shortness of breath, associated with chest pressure. She presented emergency room. CT chest PE protocol was negative for PE. It was noted that troponin was elevated. She was admitted for N STEMI. Patient was started on IV heparin drip and aspirin for anticoagulation and evaluate by cardiologist. Patient also reports coffee-ground emesis and GI was consulted.. Heparin was discontinued.  Today patient reports feeling better. Shortness of breath has improved. She is able to tolerate some liquid. Denies any chest pain.  Review of systems- Review of Systems  Constitutional: Negative for chills and weight loss.  HENT: Negative for ear pain.   Eyes: Negative for blurred vision.  Respiratory: Positive for shortness of breath. Negative for cough and hemoptysis.   Cardiovascular: Negative for chest pain and palpitations.  Gastrointestinal: Negative for nausea and vomiting.       Coffee-ground emesis  Genitourinary: Negative for dysuria.  Musculoskeletal: Negative for myalgias.  Skin: Negative for rash.  Neurological: Negative for dizziness.    Endo/Heme/Allergies: Does not bruise/bleed easily.  Psychiatric/Behavioral: Negative for depression.    Allergies  Allergen Reactions  . Ace Inhibitors Other (See Comments)    hyperkalemia  . Atenolol Other (See Comments)    Worsening palpitations  . Iodine Other (See Comments)    11/20/16: per conversation with pt, pt with allergy to topical iodine and betadine.  Pt states she has had IV contrast in the past with now issues.  . Metoprolol Tartrate Other (See Comments)    intolerant  . Omeprazole Diarrhea  . Povidone-Iodine Other (See Comments)  . Rofecoxib Nausea Only  . Ciprofloxacin Rash  . Doxycycline Calcium Rash  . Nitrofurantoin Rash  . Quinolones Rash  . Sulfa Antibiotics Rash    Patient Active Problem List   Diagnosis Date Noted  . Non-STEMI (non-ST elevated myocardial infarction) ([place]) 12/19/2016  . Ovarian cancer ([place]) 11/24/2016  . Malignant neoplasm of ovary ([place]) 11/24/2016  . Abdominal pain 11/20/2016  . Ascites 11/20/2016  . Ovarian mass 11/20/2016  . Absolute anemia 01/22/2015  . Anxiety 01/22/2015  . Malignant neoplasm of breast ([place]) 01/22/2015  . Clinical depression 01/22/2015  . Gastric catarrh 01/22/2015  . Blood glucose elevated 01/22/2015  . HLD (hyperlipidemia) 01/22/2015  . BP (high blood pressure) 01/22/2015  . Acquired atrophy of thyroid 04/02/2014  . Frequent UTI 11/23/2013  . Chronic kidney disease (CKD), stage III (moderate) ([place]) 10/02/2013     Past Medical History:  Diagnosis Date  . Breast cancer ([place]) 2005   Right, radiation and lumpectomy  . Breast cancer ([place]) 2002   Left, Chemo, radiation and lumpectomy  . Cancer ([place])   . Hyperlipidemia   . Hypertension   . Ovarian cancer ([place]) 11/24/2016  Past Surgical History:  Procedure Laterality Date  . BREAST BIOPSY Left 2010   negative stereotactic biopsy  . BREAST LUMPECTOMY Right 2005   positive  . BREAST LUMPECTOMY Left 2002   positive    Social History    Socioeconomic History  . Marital status: Widowed    Spouse name: Not on file  . Number of children: Not on file  . Years of education: Not on file  . Highest education level: Not on file  Social Needs  . Financial resource strain: Not on file  . Food insecurity - worry: Not on file  . Food insecurity - inability: Not on file  . Transportation needs - medical: Not on file  . Transportation needs - non-medical: Not on file  Occupational History  . Occupation: retired  Tobacco Use  . Smoking status: Never Smoker  . Smokeless tobacco: Never Used  Substance and Sexual Activity  . Alcohol use: No  . Drug use: No  . Sexual activity: No    Birth control/protection: Post-menopausal  Other Topics Concern  . Not on file  Social History Narrative  . Not on file     Family History  Problem Relation Age of Onset  . Hypertension Mother   . Hypertension Father      Current Facility-Administered Medications:  .  0.9 %  sodium chloride infusion, 250 mL, Intravenous, PRN, Pyreddy, Pavan, MD .  acetaminophen (TYLENOL) tablet 650 mg, 650 mg, Oral, Q4H PRN, Wieting, Richard, MD .  aspirin EC tablet 81 mg, 81 mg, Oral, Daily, Leslye Peer, Richard, MD, 81 mg at 12/20/16 1503 .  atorvastatin (LIPITOR) tablet 40 mg, 40 mg, Oral, Daily, Pyreddy, Pavan, MD, 40 mg at 12/20/16 0907 .  budesonide (PULMICORT) nebulizer solution 0.5 mg, 0.5 mg, Nebulization, BID, Wieting, Richard, MD, 0.5 mg at 12/20/16 1451 .  furosemide (LASIX) tablet 40 mg, 40 mg, Oral, Daily, Wieting, Richard, MD .  ipratropium-albuterol (DUONEB) 0.5-2.5 (3) MG/3ML nebulizer solution 3 mL, 3 mL, Nebulization, Q6H, Wieting, Richard, MD, 3 mL at 12/20/16 1453 .  levothyroxine (SYNTHROID, LEVOTHROID) tablet 50 mcg, 50 mcg, Oral, QAC breakfast, Pyreddy, Pavan, MD, 50 mcg at 12/20/16 0907 .  [START ON 12/21/2016] losartan (COZAAR) tablet 25 mg, 25 mg, Oral, Daily, Wieting, Richard, MD .  metoprolol tartrate (LOPRESSOR) tablet 25 mg, 25  mg, Oral, BID, Drane, Anna, PA-C .  morphine 2 MG/ML injection 2 mg, 2 mg, Intravenous, Q4H PRN, Pyreddy, Pavan, MD .  nitroGLYCERIN (NITROSTAT) SL tablet 0.4 mg, 0.4 mg, Sublingual, Q5 Min x 3 PRN, Pyreddy, Pavan, MD .  ondansetron (ZOFRAN) injection 4 mg, 4 mg, Intravenous, Q6H PRN, Pyreddy, Pavan, MD, 4 mg at 12/19/16 1114 .  pantoprazole (PROTONIX) injection 40 mg, 40 mg, Intravenous, Q12H, Wieting, Richard, MD .  piperacillin-tazobactam (ZOSYN) IVPB 3.375 g, 3.375 g, Intravenous, Q8H, Wieting, Richard, MD .  sodium chloride flush (NS) 0.9 % injection 3 mL, 3 mL, Intravenous, Q12H, Pyreddy, Pavan, MD, 3 mL at 12/20/16 0909 .  sodium chloride flush (NS) 0.9 % injection 3 mL, 3 mL, Intravenous, PRN, Pyreddy, Pavan, MD .  sucralfate (CARAFATE) 1 GM/10ML suspension 1 g, 1 g, Oral, TID WC & HS, Loletha Grayer, MD   Physical exam:  Vitals:   12/20/16 0733 12/20/16 0800 12/20/16 0847 12/20/16 0906  BP:   116/69 107/67  Pulse: (!) 114  (!) 117 (!) 117  Resp:   (!) 28 18  Temp:    99.6 F (37.6 C)  TempSrc:  Oral  SpO2: 94% 92% 93% 91%  Weight:      Height:       GENERAL:Alert, no distress and comfortable.  EYES: no pallor or icterus OROPHARYNX: no thrush or ulceration;  NECK: supple, no masses felt LYMPH:  no palpable lymphadenopathy in the cervical, axillary or inguinal regions LUNGS: clear to auscultation and  No wheeze or crackles HEART/CVS: regular rate & rhythm and no murmurs; No lower extremity edema ABDOMEN: abdomen soft, non-tender and normal bowel sounds. Nondistended Musculoskeletal:no cyanosis of digits and no clubbing  PSYCH: alert & oriented x 3  NEURO: no focal motor/sensory deficits SKIN:  no rashes or significant lesions     CMP Latest Ref Rng & Units 12/20/2016  Glucose 65 - 99 mg/dL 155(H)  BUN 6 - 20 mg/dL 55(H)  Creatinine 0.44 - 1.00 mg/dL 1.18(H)  Sodium 135 - 145 mmol/L 132(L)  Potassium 3.5 - 5.1 mmol/L 4.8  Chloride 101 - 111 mmol/L 102  CO2 22  - 32 mmol/L 20(L)  Calcium 8.9 - 10.3 mg/dL 7.9(L)  Total Protein 6.5 - 8.1 g/dL -  Total Bilirubin 0.3 - 1.2 mg/dL -  Alkaline Phos 38 - 126 U/L -  AST 15 - 41 U/L -  ALT 14 - 54 U/L -   CBC Latest Ref Rng & Units 12/20/2016  WBC 3.6 - 11.0 K/uL 8.6  Hemoglobin 12.0 - 16.0 g/dL 11.3(L)  Hematocrit 35.0 - 47.0 % 34.4(L)  Platelets 150 - 440 K/uL 176     Dg Chest 2 View  Result Date: 12/19/2016 CLINICAL DATA:  Central chest pain and shortness of breath yesterday. History of ovarian cancer, breast cancer, on chemotherapy. EXAM: CHEST  2 VIEW COMPARISON:  CT chest November 20, 2016 FINDINGS: The cardiac silhouette is mildly enlarged and unchanged. Calcified aortic knob. Small pleural effusions. No focal consolidation. RIGHT single-lumen chest Port-A-Cath distal tip projects in mid superior vena cava. No pneumothorax. Soft tissue planes and included osseous structures are nonsuspicious. Calcifications in the neck are likely vascular. Osteopenia. IMPRESSION: Mild cardiomegaly.  Small pleural effusions. Aortic Atherosclerosis (ICD10-I70.0). Electronically Signed   By: Elon Alas M.D.   On: 12/19/2016 02:00   Ct Angio Chest Pe W/cm &/or Wo Cm  Result Date: 12/19/2016 CLINICAL DATA:  Acute onset of central chest pain and shortness of breath. Current history of ovarian cancer, status post recent chemotherapy. EXAM: CT ANGIOGRAPHY CHEST WITH CONTRAST TECHNIQUE: Multidetector CT imaging of the chest was performed using the standard protocol during bolus administration of intravenous contrast. Multiplanar CT image reconstructions and MIPs were obtained to evaluate the vascular anatomy. CONTRAST:  61mL ISOVUE-370 IOPAMIDOL (ISOVUE-370) INJECTION 76% COMPARISON:  Chest radiograph performed earlier today at 1:41 a.m., and CTA of the chest performed 11/20/2016 FINDINGS: Cardiovascular:  There is no evidence of pulmonary embolus. The heart is borderline normal in size. Diffuse coronary artery  calcifications are seen. Calcification is noted at the aortic valve. Scattered calcification is seen along the aortic arch and descending thoracic aorta, and at the great vessels. Borderline prominence of the ascending thoracic aorta is thought to be within normal limits given the patient's age. Mediastinum/Nodes: An enlarged subcarinal node is suggested, measuring 2.0 cm in short axis. Previously noted esophageal wall thickening has largely resolved. A small residual hiatal hernia is seen. No pericardial effusion is identified. The thyroid gland is grossly unremarkable. No axillary lymphadenopathy is seen. A right-sided chest port is noted. Lungs/Pleura: Small bilateral pleural effusions are noted, with associated bibasilar atelectasis. No pneumothorax  is seen. No definite mass is identified. Upper Abdomen: The visualized portions of the liver and the spleen are unremarkable. Small stones are noted dependently within the gallbladder. The visualized portions of the adrenal glands and kidneys are grossly unremarkable. There is a 3.4 cm cystic mass at the tail of the pancreas, as noted on prior CT. Musculoskeletal: No acute osseous abnormalities are identified. Multilevel endplate sclerotic change is noted along the thoracic and upper lumbar spine, with vacuum phenomenon. The visualized musculature is unremarkable in appearance. Review of the MIP images confirms the above findings. IMPRESSION: 1. No evidence of pulmonary embolus. 2. Small bilateral pleural effusions, with bibasilar atelectasis. 3. Diffuse coronary artery calcifications. 4. Enlarged subcarinal node, measuring 2.0 cm in short axis, may reflect the patient's malignancy or may be chronic in nature, though mildly more prominent than on the recent prior CTA. 5. Small residual hiatal hernia noted. 6. Cholelithiasis.  Gallbladder otherwise unremarkable. 7. 3.4 cm cystic mass again noted at the tail of the pancreas; this is relatively stable in appearance.  Electronically Signed   By: Garald Balding M.D.   On: 12/19/2016 04:20   US Paracentesis  Result Date: 11/22/2016 INDICATION: Ascites EXAM: ULTRASOUND GUIDED PARACENTESIS MEDICATIONS: None. COMPLICATIONS: None immediate. PROCEDURE: Informed written consent was obtained from the patient after a discussion of the risks, benefits and alternatives to treatment. A timeout was performed prior to the initiation of the procedure. Initial ultrasound scanning demonstrates a large amount of ascites within the right lower abdominal quadrant. The right lower abdomen was prepped and draped in the usual sterile fashion. 1% lidocaine was used for local anesthesia. Following this, a 6 Fr Safe-T-Centesis catheter was introduced. An ultrasound image was saved for documentation purposes. The paracentesis was performed. The catheter was removed and a dressing was applied. The patient tolerated the procedure well without immediate post procedural complication. FINDINGS: A total of approximately 4.7 L of yellow fluid was removed. Samples were sent to the laboratory as requested by the clinical team. IMPRESSION: Successful ultrasound-guided paracentesis yielding 4.7 liters of peritoneal fluid. Electronically Signed   By: Inez Catalina M.D.   On: 11/22/2016 12:15   Dg Chest Port 1 View  Result Date: 12/20/2016 CLINICAL DATA:  83 year old female undergoing chemotherapy for ovarian cancer. Treatment last week with subsequent shortness of breath. EXAM: PORTABLE CHEST 1 VIEW COMPARISON:  Chest CTA 12/19/2016 and earlier. FINDINGS: Portable AP upright view at 1424 hours. Stable right IJ approach chest porta cath, currently accessed. Increased radiographic opacity at both lung bases in part compatible with the pleural effusions demonstrated by CTA yesterday. Left greater than right lower lobe opacity increase. Increased patchy right perihilar opacity which partially overlaps the right Port-A-Cath. No pneumothorax. Stable cardiac size and  mediastinal contours. No pneumothorax. Stable pulmonary vascularity. IMPRESSION: Increased bibasilar and right suprahilar pulmonary opacity since the CTA yesterday appears to reflect a combination of airspace disease and small pleural effusions. Consider acute multifocal infection versus aspiration. Electronically Signed   By: Genevie Ann M.D.   On: 12/20/2016 14:33    Assessment and plan- Patient is a 83 y.o. female with remote history of breast cancer, recently diagnosed with stage III ovarian cancer status post 2 cycles of carboplatin + Taxol+ the Avastin, planned debulking surgery after 4 cycles of chemotherapy treatment, presented with shortness of breath and chest discomfort.  # NSTEMI: Cardiology on board. 2-D echo results pending. Continue medical management. # Coffee-ground emesis: Continue IV PPI. Agree with GI that if no overt signs  of bleeding, restart aspirin. Deferred GI for need of EGD. # Stage III ovarian cancer currently on chemotherapy treatment: tumor marker significantly improved after chemotherapy treatment and clinically her ascites has improved. At this point I don't think she needs paracentesis. She can follow up outpatient for reevaluation if she is a candidate for additional chemotherapy.  She is a full code. I discussed with patient and her daughter regarding [place]. Recommend DNR/DNI. Patient would like to consider and update medical team.   Thank you for this kind referral and the opportunity to participate in the care of this patient. Will continue follow along her inpatient course.   Dr. Earlie Server, MD, PhD [facility] Pager- [phone] 12/20/2016

## 2016-12-20 NOTE — Progress Notes (Signed)
Initial Nutrition Assessment  DOCUMENTATION CODES:   Not applicable  INTERVENTION:   Recommend check iron/anemia labs  Recommend check P and Mg labs   Ensure Enlive po BID, each supplement provides 350 kcal and 20 grams of protein  MVI  NUTRITION DIAGNOSIS:   Increased nutrient needs related to cancer and cancer related treatments as evidenced by increased estimated needs from protein.  GOAL:   Patient will meet greater than or equal to 90% of their needs  MONITOR:   PO intake, Supplement acceptance, Labs, Weight trends, I & O's  REASON FOR ASSESSMENT:   Malnutrition Screening Tool    ASSESSMENT:   83 y.o. 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 Pt reports poor appetite and oral intake for 2 days pta r/t respiratory distress. Pt is currently eating <50% of her clear liquid diet; pt advanced to full liquids today. Pt reports that she drinks one strawberry Ensure every day at home; RD will order. Pt stopped taking her MVI when she started drinking Ensure; recommend restart daily MVI. Pt denies any N/V today. Per chart, pt has lost 15lbs over the past month; pt s/p paracentesis with 5L of fluid removal a few weeks ago. RD is unsure how much of this was true weight loss vs fluid changes. Per MD note, pt with thickening of her esophagus on old CT scan which could be from a neoplasm or from esophagitis; will hold off on EGD for now.   Medications reviewed and include: aspirin, lasix, synthroid, protonix, carafate, zosyn   Labs reviewed: Na 132(L), BUN 55(H), creat 1.18(H), Ca 7.9(L) cbgs- 144, 161, 155 x 24 hrs  Nutrition-Focused physical exam completed. Findings are no fat depletion, no muscle depletion, and no edema.    Diet Order:  Diet full liquid Room service appropriate? Yes; Fluid consistency: Thin  EDUCATION NEEDS:   Education needs have been addressed  Skin: Reviewed RN Assessment  Last BM:  11/11  Height:   Ht Readings from Last 1 Encounters:  12/19/16 _0  (1.702 m)    Weight:   Wt Readings from Last 1 Encounters:  12/19/16 159 lb 11.2 oz (72.4 kg)    Ideal Body Weight:  61.36 kg  BMI:  Body mass index is 25.01 kg/m.  Estimated Nutritional Needs:   Kcal:  1600-1800kcal/day   Protein:  80-94g/day   Fluid:  >1.6L/day   Koleen Distance MS, RD, LDN Pager #[phone] After Hours Pager: [phone]

## 2016-12-20 NOTE — Progress Notes (Signed)
Open in error

## 2016-12-20 NOTE — Progress Notes (Signed)
*  PRELIMINARY RESULTS* Echocardiogram 2D Echocardiogram has been performed.  Sherrie Sport 12/20/2016, 1:52 PM

## 2016-12-20 NOTE — Progress Notes (Signed)
Patient ID: [name], female   DOB: September 24, 1933, 83 y.o.   MRN: [id number]  [facility] PROGRESS NOTE  Ebony Navarro:[id number] DOB: [birth year] DOA: 12/19/2016 PCP: Adin Hector, MD  HPI/Subjective: Patient had worsening respiratory status overnight and was placed on 100% nonrebreather then on high flow nasal cannula.  Given a dose of Lasix earlier.  Patient with cough and shortness of breath but feeling better on the high flow nasal cannula.  Still no complaints of chest pain.  Objective: Vitals:   12/20/16 0847 12/20/16 0906  BP: 116/69 107/67  Pulse: (!) 117 (!) 117  Resp: (!) 28 18  Temp:  99.6 F (37.6 C)  SpO2: 93% 91%    Filed Weights   12/19/16 0130 12/19/16 0522  Weight: 72.6 kg (160 lb) 72.4 kg (159 lb 11.2 oz)    ROS: Review of Systems  Constitutional: Negative for chills and fever.  Eyes: Negative for blurred vision.  Respiratory: Positive for cough and shortness of breath.   Cardiovascular: Negative for chest pain.  Gastrointestinal: Positive for abdominal pain. Negative for constipation, diarrhea, nausea and vomiting.  Genitourinary: Negative for dysuria.  Musculoskeletal: Negative for joint pain.  Neurological: Negative for dizziness and headaches.   Exam: Physical Exam  Constitutional: She is oriented to person, place, and time.  HENT:  Nose: No mucosal edema.  Mouth/Throat: No oropharyngeal exudate or posterior oropharyngeal edema.  Eyes: Conjunctivae, EOM and lids are normal. Pupils are equal, round, and reactive to light.  Neck: No JVD present. Carotid bruit is not present. No edema present. No thyroid mass and no thyromegaly present.  Cardiovascular: S1 normal and S2 normal. Tachycardia present. Exam reveals no gallop.  No murmur heard. Pulses:      Dorsalis pedis pulses are 2+ on the right side, and 2+ on the left side.  Respiratory: No respiratory distress. She has decreased breath sounds in the right middle field, the  right lower field, the left middle field and the left lower field. She has wheezes in the left middle field. She has rhonchi in the right lower field and the left lower field. She has no rales.  GI: Soft. Bowel sounds are normal. She exhibits distension. There is tenderness in the epigastric area.  Musculoskeletal:       Right ankle: She exhibits no swelling.       Left ankle: She exhibits no swelling.  Lymphadenopathy:    She has no cervical adenopathy.  Neurological: She is alert and oriented to person, place, and time. No cranial nerve deficit.  Skin: Skin is warm. No rash noted. Nails show no clubbing.  Psychiatric: She has a normal mood and affect.      Data Reviewed: Basic Metabolic Panel: Recent Labs  Lab 12/17/16 0840 12/19/16 0212 12/19/16 0525 12/20/16 0540  NA 134* 136 133* 132*  K 4.4 4.0 4.7 4.8  CL 103 106 104 102  CO2 23 20* 20* 20*  GLUCOSE 146* 144* 161* 155*  BUN 29* 40* 43* 55*  CREATININE 0.96 0.92 1.03* 1.18*  CALCIUM 9.1 7.4* 8.4* 7.9*   Liver Function Tests: Recent Labs  Lab 12/17/16 0840  AST 27  ALT 14  ALKPHOS 72  BILITOT 0.9  PROT 7.7  ALBUMIN 3.6   CBC: Recent Labs  Lab 12/17/16 0840 12/19/16 0212 12/19/16 0525 12/19/16 1109 12/20/16 0540  WBC 5.2 11.7* 11.8*  --  8.6  NEUTROABS 3.4  --   --   --   --  HGB 12.1 11.1* 12.1 12.1 11.3*  HCT 35.5 34.2* 37.3  --  34.4*  MCV 91.4 93.3 93.1  --  92.6  PLT 234 219 228  --  176   Cardiac Enzymes: Recent Labs  Lab 12/19/16 0212 12/19/16 0525 12/19/16 1109 12/19/16 1718  TROPONINI 3.16* 6.72* 10.05* 13.33*     Studies: Dg Chest 2 View  Result Date: 12/19/2016 CLINICAL DATA:  Central chest pain and shortness of breath yesterday. History of ovarian cancer, breast cancer, on chemotherapy. EXAM: CHEST  2 VIEW COMPARISON:  CT chest November 20, 2016 FINDINGS: The cardiac silhouette is mildly enlarged and unchanged. Calcified aortic knob. Small pleural effusions. No focal  consolidation. RIGHT single-lumen chest Port-A-Cath distal tip projects in mid superior vena cava. No pneumothorax. Soft tissue planes and included osseous structures are nonsuspicious. Calcifications in the neck are likely vascular. Osteopenia. IMPRESSION: Mild cardiomegaly.  Small pleural effusions. Aortic Atherosclerosis (ICD10-I70.0). Electronically Signed   By: Elon Alas M.D.   On: 12/19/2016 02:00   Ct Angio Chest Pe W/cm &/or Wo Cm  Result Date: 12/19/2016 CLINICAL DATA:  Acute onset of central chest pain and shortness of breath. Current history of ovarian cancer, status post recent chemotherapy. EXAM: CT ANGIOGRAPHY CHEST WITH CONTRAST TECHNIQUE: Multidetector CT imaging of the chest was performed using the standard protocol during bolus administration of intravenous contrast. Multiplanar CT image reconstructions and MIPs were obtained to evaluate the vascular anatomy. CONTRAST:  85mL ISOVUE-370 IOPAMIDOL (ISOVUE-370) INJECTION 76% COMPARISON:  Chest radiograph performed earlier today at 1:41 a.m., and CTA of the chest performed 11/20/2016 FINDINGS: Cardiovascular:  There is no evidence of pulmonary embolus. The heart is borderline normal in size. Diffuse coronary artery calcifications are seen. Calcification is noted at the aortic valve. Scattered calcification is seen along the aortic arch and descending thoracic aorta, and at the great vessels. Borderline prominence of the ascending thoracic aorta is thought to be within normal limits given the patient's age. Mediastinum/Nodes: An enlarged subcarinal node is suggested, measuring 2.0 cm in short axis. Previously noted esophageal wall thickening has largely resolved. A small residual hiatal hernia is seen. No pericardial effusion is identified. The thyroid gland is grossly unremarkable. No axillary lymphadenopathy is seen. A right-sided chest port is noted. Lungs/Pleura: Small bilateral pleural effusions are noted, with associated bibasilar  atelectasis. No pneumothorax is seen. No definite mass is identified. Upper Abdomen: The visualized portions of the liver and the spleen are unremarkable. Small stones are noted dependently within the gallbladder. The visualized portions of the adrenal glands and kidneys are grossly unremarkable. There is a 3.4 cm cystic mass at the tail of the pancreas, as noted on prior CT. Musculoskeletal: No acute osseous abnormalities are identified. Multilevel endplate sclerotic change is noted along the thoracic and upper lumbar spine, with vacuum phenomenon. The visualized musculature is unremarkable in appearance. Review of the MIP images confirms the above findings. IMPRESSION: 1. No evidence of pulmonary embolus. 2. Small bilateral pleural effusions, with bibasilar atelectasis. 3. Diffuse coronary artery calcifications. 4. Enlarged subcarinal node, measuring 2.0 cm in short axis, may reflect the patient's malignancy or may be chronic in nature, though mildly more prominent than on the recent prior CTA. 5. Small residual hiatal hernia noted. 6. Cholelithiasis.  Gallbladder otherwise unremarkable. 7. 3.4 cm cystic mass again noted at the tail of the pancreas; this is relatively stable in appearance. Electronically Signed   By: Garald Balding M.D.   On: 12/19/2016 04:20   Dg Chest Port 1  View  Result Date: 12/20/2016 CLINICAL DATA:  83 year old female undergoing chemotherapy for ovarian cancer. Treatment last week with subsequent shortness of breath. EXAM: PORTABLE CHEST 1 VIEW COMPARISON:  Chest CTA 12/19/2016 and earlier. FINDINGS: Portable AP upright view at 1424 hours. Stable right IJ approach chest porta cath, currently accessed. Increased radiographic opacity at both lung bases in part compatible with the pleural effusions demonstrated by CTA yesterday. Left greater than right lower lobe opacity increase. Increased patchy right perihilar opacity which partially overlaps the right Port-A-Cath. No pneumothorax.  Stable cardiac size and mediastinal contours. No pneumothorax. Stable pulmonary vascularity. IMPRESSION: Increased bibasilar and right suprahilar pulmonary opacity since the CTA yesterday appears to reflect a combination of airspace disease and small pleural effusions. Consider acute multifocal infection versus aspiration. Electronically Signed   By: Genevie Ann M.D.   On: 12/20/2016 14:33    Scheduled Meds: . aspirin EC  81 mg Oral Daily  . atorvastatin  40 mg Oral Daily  . budesonide (PULMICORT) nebulizer solution  0.5 mg Nebulization BID  . [START ON 12/21/2016] feeding supplement (ENSURE ENLIVE)  237 mL Oral BID BM  . furosemide  40 mg Oral Daily  . ipratropium-albuterol  3 mL Nebulization Q6H  . levothyroxine  50 mcg Oral QAC breakfast  . [START ON 12/21/2016] losartan  25 mg Oral Daily  . metoprolol tartrate  25 mg Oral BID  . multivitamin with minerals  1 tablet Oral Daily  . pantoprazole  40 mg Intravenous Q12H  . sodium chloride flush  3 mL Intravenous Q12H  . sucralfate  1 g Oral TID WC & HS   Continuous Infusions: . sodium chloride    . piperacillin-tazobactam (ZOSYN)  IV      Assessment/Plan:  1. Acute hypoxic respiratory failure.  Patient placed on high flow nasal cannula 45% FiO2.   2. Aspiration pneumonia versus fluid overload.  Patient given a dose of Lasix.  I will continue oral Lasix daily and start Zosyn.  IV fluids stopped.  Protonix drip switched over to Protonix twice daily.   3. Upper GI bleed with hematemesis.  Hold Decadron.  Stop heparin drip.  Hold aspirin.  Change Protonix drip to twice daily dosing.  As per GI okay to start enteric-coated aspirin and Carafate 4. NSTEMI.  Unable to do heparin drip secondary to GI bleed.  As per GI, okay to restart enteric-coated aspirin.  Patient on low-dose metoprolol.  Continue atorvastatin. 5. 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  Ovarian cancer primary.  Patient received her second chemotherapy on Friday.  Case discussed  with Dr. Tasia Catchings oncology and she mentioned the chemo agent could increase risk for vascular events. 6. Hyperlipidemia unspecified on atorvastatin 7. Hypothyroidism unspecified. On levothyroxine. 8. Aortic stenosis seen on last echo  Code Status:     Code Status Orders  (From admission, onward)        Start     Ordered   12/19/16 0526  Full code  Continuous     12/19/16 0526    Code Status History    Date Active Date Inactive Code Status Order ID Comments User Context   11/20/2016 16:39 11/22/2016 19:50 Full Code [id number]  Idelle Crouch, MD ED    Advance Directive Documentation     Most Recent Value  Type of Advance Directive  Healthcare Power of Attorney  Pre-existing out of facility DNR order (yellow form or pink MOST form)  No data  "MOST" Form in Place?  No data  Family Communication: spoke with daughter at the bedside Disposition Plan: tbd  Consultants:  Cardiology  Gastroenterology  Oncology  Time spent: 35 minutes, case discussed with oncology, including ACP time  Loletha Grayer  [employer]

## 2016-12-20 NOTE — Progress Notes (Signed)
[facility]  SUBJECTIVE: The patient reports feeling better this morning. She denies chest pain.    Vitals:   12/20/16 0733 12/20/16 0800 12/20/16 0847 12/20/16 0906  BP:   116/69 107/67  Pulse: (!) 114  (!) 117 (!) 117  Resp:   (!) 28 18  Temp:    99.6 F (37.6 C)  TempSrc:    Oral  SpO2: 94% 92% 93% 91%  Weight:      Height:         Intake/Output Summary (Last 24 hours) at 12/20/2016 1011 Last data filed at 12/20/2016 1001 Gross per 24 hour  Intake 1463 ml  Output 1501 ml  Net -38 ml      PHYSICAL EXAM  General: Chronically-ill appearing, in no acute distress HEENT:  Normocephalic and atramatic Neck:  Supple Lungs: Normal effort of breathing on supplemental oxygen Heart: Tachycardic, 2/6 systolic murmur Abdomen: Bowel sounds are positive Msk:  Back normal,sitting upright in bed. Gait not assessed. Extremities: No clubbing, cyanosis or edema.   Neuro: Alert and oriented X 3. Psych:  Good affect, responds appropriately   LABS: Basic Metabolic Panel: Recent Labs    12/19/16 0525 12/20/16 0540  NA 133* 132*  K 4.7 4.8  CL 104 102  CO2 20* 20*  GLUCOSE 161* 155*  BUN 43* 55*  CREATININE 1.03* 1.18*  CALCIUM 8.4* 7.9*   Liver Function Tests: No results for input(s): AST, ALT, ALKPHOS, BILITOT, PROT, ALBUMIN in the last 72 hours. No results for input(s): LIPASE, AMYLASE in the last 72 hours. CBC: Recent Labs    12/19/16 0525 12/19/16 1109 12/20/16 0540  WBC 11.8*  --  8.6  HGB 12.1 12.1 11.3*  HCT 37.3  --  34.4*  MCV 93.1  --  92.6  PLT 228  --  176   Cardiac Enzymes: Recent Labs    12/19/16 0525 12/19/16 1109 12/19/16 1718  TROPONINI 6.72* 10.05* 13.33*   BNP: Invalid input(s): POCBNP D-Dimer: No results for input(s): DDIMER in the last 72 hours. Hemoglobin A1C: No results for input(s): HGBA1C in the last 72 hours. Fasting Lipid Panel: Recent Labs    12/19/16 0525  CHOL 154  HDL 52  LDLCALC 82  TRIG 101  CHOLHDL 3.0    Thyroid Function Tests: No results for input(s): TSH, T4TOTAL, T3FREE, THYROIDAB in the last 72 hours.  Invalid input(s): FREET3 Anemia Panel: No results for input(s): VITAMINB12, FOLATE, FERRITIN, TIBC, IRON, RETICCTPCT in the last 72 hours.  Dg Chest 2 View  Result Date: 12/19/2016 CLINICAL DATA:  Central chest pain and shortness of breath yesterday. History of ovarian cancer, breast cancer, on chemotherapy. EXAM: CHEST  2 VIEW COMPARISON:  CT chest November 20, 2016 FINDINGS: The cardiac silhouette is mildly enlarged and unchanged. Calcified aortic knob. Small pleural effusions. No focal consolidation. RIGHT single-lumen chest Port-A-Cath distal tip projects in mid superior vena cava. No pneumothorax. Soft tissue planes and included osseous structures are nonsuspicious. Calcifications in the neck are likely vascular. Osteopenia. IMPRESSION: Mild cardiomegaly.  Small pleural effusions. Aortic Atherosclerosis (ICD10-I70.0). Electronically Signed   By: Elon Alas M.D.   On: 12/19/2016 02:00   Ct Angio Chest Pe W/cm &/or Wo Cm  Result Date: 12/19/2016 CLINICAL DATA:  Acute onset of central chest pain and shortness of breath. Current history of ovarian cancer, status post recent chemotherapy. EXAM: CT ANGIOGRAPHY CHEST WITH CONTRAST TECHNIQUE: Multidetector CT imaging of the chest was performed using the standard protocol during bolus administration of intravenous contrast.  Multiplanar CT image reconstructions and MIPs were obtained to evaluate the vascular anatomy. CONTRAST:  12mL ISOVUE-370 IOPAMIDOL (ISOVUE-370) INJECTION 76% COMPARISON:  Chest radiograph performed earlier today at 1:41 a.m., and CTA of the chest performed 11/20/2016 FINDINGS: Cardiovascular:  There is no evidence of pulmonary embolus. The heart is borderline normal in size. Diffuse coronary artery calcifications are seen. Calcification is noted at the aortic valve. Scattered calcification is seen along the aortic arch and  descending thoracic aorta, and at the great vessels. Borderline prominence of the ascending thoracic aorta is thought to be within normal limits given the patient's age. Mediastinum/Nodes: An enlarged subcarinal node is suggested, measuring 2.0 cm in short axis. Previously noted esophageal wall thickening has largely resolved. A small residual hiatal hernia is seen. No pericardial effusion is identified. The thyroid gland is grossly unremarkable. No axillary lymphadenopathy is seen. A right-sided chest port is noted. Lungs/Pleura: Small bilateral pleural effusions are noted, with associated bibasilar atelectasis. No pneumothorax is seen. No definite mass is identified. Upper Abdomen: The visualized portions of the liver and the spleen are unremarkable. Small stones are noted dependently within the gallbladder. The visualized portions of the adrenal glands and kidneys are grossly unremarkable. There is a 3.4 cm cystic mass at the tail of the pancreas, as noted on prior CT. Musculoskeletal: No acute osseous abnormalities are identified. Multilevel endplate sclerotic change is noted along the thoracic and upper lumbar spine, with vacuum phenomenon. The visualized musculature is unremarkable in appearance. Review of the MIP images confirms the above findings. IMPRESSION: 1. No evidence of pulmonary embolus. 2. Small bilateral pleural effusions, with bibasilar atelectasis. 3. Diffuse coronary artery calcifications. 4. Enlarged subcarinal node, measuring 2.0 cm in short axis, may reflect the patient's malignancy or may be chronic in nature, though mildly more prominent than on the recent prior CTA. 5. Small residual hiatal hernia noted. 6. Cholelithiasis.  Gallbladder otherwise unremarkable. 7. 3.4 cm cystic mass again noted at the tail of the pancreas; this is relatively stable in appearance. Electronically Signed   By: Garald Balding M.D.   On: 12/19/2016 04:20     Echo EF 55-65%, moderate AS, mild-moderate AR, mild  MR  TELEMETRY: Sinus tachycardia 108 bpm  ASSESSMENT AND PLAN:  Active Problems:   Non-STEMI (non-ST elevated myocardial infarction) ([place])    1. Non-ST elevation myocardial infarction, in the absence of chest pain, without ischemic ST-T wave changes on ECG, started on heparin drip, developed coffee-ground emesis and heparin discontinued. Initial conservative management recommended.   2. Newly diagnosed ovarian cancer with metastatic ascites, status post paracentesis, with recent chemotherapy  Recommendations: 1. Agree with overall therapy 2. Await GI consult 3. Continue conservation management with metoprolol, atorvastatin, and PRN nitroglycerin. 4. Defer heparin and aspirin for now with recent coffee-ground emesis 5. Increase metoprolol to 25 mg BID, monitoring for side effects. Tolerated metoprolol 12.5 mg  6. Review 2D echocardiogram   Clabe Seal, PA-C 12/20/2016 10:11 AM

## 2016-12-21 ENCOUNTER — Encounter: Payer: Self-pay | Admitting: *Deleted

## 2016-12-21 DIAGNOSIS — J9601 Acute respiratory failure with hypoxia: Secondary | ICD-10-CM

## 2016-12-21 DIAGNOSIS — I480 Paroxysmal atrial fibrillation: Secondary | ICD-10-CM

## 2016-12-21 LAB — BASIC METABOLIC PANEL
ANION GAP: 10 (ref 5–15)
BUN: 56 mg/dL — ABNORMAL HIGH (ref 6–20)
CHLORIDE: 100 mmol/L — AB (ref 101–111)
CO2: 22 mmol/L (ref 22–32)
CREATININE: 1.26 mg/dL — AB (ref 0.44–1.00)
Calcium: 7.7 mg/dL — ABNORMAL LOW (ref 8.9–10.3)
GFR calc non Af Amer: 39 mL/min — ABNORMAL LOW (ref >60)
GFR, EST AFRICAN AMERICAN: 45 mL/min — AB (ref >60)
Glucose, Bld: 284 mg/dL — ABNORMAL HIGH (ref 65–99)
POTASSIUM: 3.7 mmol/L (ref 3.5–5.1)
SODIUM: 132 mmol/L — AB (ref 135–145)

## 2016-12-21 LAB — CBC
HEMATOCRIT: 33 % — AB (ref 35.0–47.0)
HEMOGLOBIN: 10.8 g/dL — AB (ref 12.0–16.0)
MCH: 30.7 pg (ref 26.0–34.0)
MCHC: 32.8 g/dL (ref 32.0–36.0)
MCV: 93.4 fL (ref 80.0–100.0)
Platelets: 131 10*3/uL — ABNORMAL LOW (ref 150–440)
RBC: 3.53 MIL/uL — AB (ref 3.80–5.20)
RDW: 15.9 % — ABNORMAL HIGH (ref 11.5–14.5)
WBC: 5.1 10*3/uL (ref 3.6–11.0)

## 2016-12-21 LAB — GLUCOSE, CAPILLARY: GLUCOSE-CAPILLARY: 204 mg/dL — AB (ref 65–99)

## 2016-12-21 LAB — MRSA PCR SCREENING: MRSA BY PCR: NEGATIVE

## 2016-12-21 LAB — PHOSPHORUS: PHOSPHORUS: 3 mg/dL (ref 2.5–4.6)

## 2016-12-21 LAB — MAGNESIUM: MAGNESIUM: 2.2 mg/dL (ref 1.7–2.4)

## 2016-12-21 LAB — PROCALCITONIN: PROCALCITONIN: 0.29 ng/mL

## 2016-12-21 MED ORDER — AMIODARONE IV BOLUS ONLY 150 MG/100ML: 150.0000 mg | Freq: Once | INTRAVENOUS | Status: DC

## 2016-12-21 MED ORDER — AMIODARONE HCL IN DEXTROSE 360-4.14 MG/200ML-% IV SOLN
30.0000 mg/h | INTRAVENOUS | Status: DC
  Administered 2016-12-21 – 2016-12-22 (×4): 30 mg/h via INTRAVENOUS
  Filled 2016-12-21 (×2): qty 200

## 2016-12-21 MED ORDER — AMIODARONE HCL IN DEXTROSE 360-4.14 MG/200ML-% IV SOLN
60.0000 mg/h | INTRAVENOUS | Status: DC
  Administered 2016-12-21 (×2): 60 mg/h via INTRAVENOUS
  Filled 2016-12-21 (×3): qty 200

## 2016-12-21 MED ORDER — FUROSEMIDE 10 MG/ML IJ SOLN
20.0000 mg | Freq: Once | INTRAMUSCULAR | Status: AC
  Administered 2016-12-21: 20 mg via INTRAVENOUS
  Filled 2016-12-21: qty 2

## 2016-12-21 MED ORDER — AMIODARONE LOAD VIA INFUSION
150.0000 mg | Freq: Once | INTRAVENOUS | Status: DC
  Filled 2016-12-21: qty 83.34

## 2016-12-21 MED ORDER — POTASSIUM CHLORIDE 20 MEQ PO PACK
40.0000 meq | PACK | Freq: Once | ORAL | Status: AC
Start: 2016-12-21 — End: 2016-12-21
  Administered 2016-12-21: 40 meq via ORAL
  Filled 2016-12-21: qty 2

## 2016-12-21 MED ORDER — AMIODARONE LOAD VIA INFUSION
150.0000 mg | Freq: Once | INTRAVENOUS | Status: AC
  Administered 2016-12-21: 150 mg via INTRAVENOUS
  Filled 2016-12-21: qty 83.34

## 2016-12-21 NOTE — Consult Note (Signed)
PULMONARY / CRITICAL CARE MEDICINE   Name: Ebony Navarro MRN: [id number] DOB: February 13, 1933    ADMISSION DATE:  12/19/2016   CONSULTATION DATE:  12/21/16  REFERRING MD:  Dr. Leslye Peer  SIGNIFICANT EVENTS: 12/19/16: Admitted 12/21/16: Transferred to the ICU for acute respiratory failure, and A. fib with RVR  HISTORY OF PRESENT ILLNESS:   This is an 83 year old Caucasian female with past medical history as indicated below, currently undergoing treatment for ovarian cancer, who presented to the ED with complaints of dyspnea and chest pain.  She was ruled out for a PE but ruled in for an non-ST elevation MI.  She was placed on a heparin drip and admitted for further management.  She developed hematemesis and deemed to have an upper GI bleed, hence  the heparin infusion was stopped.  This morning patient developed A. fib with RVR with rates in the 140s.  Her oxygen requirements have been increasing overnight.  She is now on high flow nasal cannula at 50%.  She is being transferred to the ICU for further management.  PAST MEDICAL HISTORY :  She  has a past medical history of Breast cancer ([place]) (2005), Breast cancer ([place]) (2002), Cancer ([place]), Hyperlipidemia, Hypertension, and Ovarian cancer ([place]) (11/24/2016).  PAST SURGICAL HISTORY: She  has a past surgical history that includes Breast lumpectomy (Right, 2005); Breast lumpectomy (Left, 2002); Breast biopsy (Left, 2010); and PORTA CATH INSERTION (N/A, 12/09/2016).  Allergies  Allergen Reactions  . Ace Inhibitors Other (See Comments)    hyperkalemia  . Atenolol Other (See Comments)    Worsening palpitations  . Iodine Other (See Comments)    11/20/16: per conversation with pt, pt with allergy to topical iodine and betadine.  Pt states she has had IV contrast in the past with now issues.  . Metoprolol Tartrate Other (See Comments)    intolerant  . Omeprazole Diarrhea  . Povidone-Iodine Other (See Comments)  . Rofecoxib Nausea Only  .  Ciprofloxacin Rash  . Doxycycline Calcium Rash  . Nitrofurantoin Rash  . Quinolones Rash  . Sulfa Antibiotics Rash    No current facility-administered medications on file prior to encounter.    Current Outpatient Medications on File Prior to Encounter  Medication Sig  . atorvastatin (LIPITOR) 40 MG tablet Take 40 mg by mouth daily.   Marland Kitchen dexamethasone (DECADRON) 4 MG tablet Take 8 mg daily by mouth. Start the day after chemotherapy for 2 days  . diltiazem (CARDIZEM CD) 120 MG 24 hr capsule TAKE 120 MG BY MOUTH EVERY OTHER DAY  . levothyroxine (SYNTHROID, LEVOTHROID) 50 MCG tablet TAKE 1 TABLET BY MOUTH DAILY ON AN EMPTY STOMACH WITH A GLASS OF WATER 30-60 MIN BEFORE BREAKFAST  . lidocaine-prilocaine (EMLA) cream Apply to affected area once  . LORazepam (ATIVAN) 0.5 MG tablet Take 1 tablet (0.5 mg total) by mouth every 6 (six) hours as needed (Nausea or vomiting).  . megestrol (MEGACE) 20 MG tablet Take 1 tablet (20 mg total) by mouth daily.  . ondansetron (ZOFRAN) 8 MG tablet Take 1 tablet (8 mg total) by mouth 2 (two) times daily as needed for refractory nausea / vomiting. Start on day 3 after chemo.  . prochlorperazine (COMPAZINE) 10 MG tablet Take 1 tablet (10 mg total) by mouth every 6 (six) hours as needed (Nausea or vomiting).  . promethazine (PHENERGAN) 25 MG tablet Take 1 tablet (25 mg total) by mouth every 6 (six) hours as needed for nausea or vomiting.    FAMILY HISTORY:  Her indicated that her mother is deceased. She indicated that her father is deceased.   SOCIAL HISTORY: She  reports that  has never smoked. she has never used smokeless tobacco. She reports that she does not drink alcohol or use drugs.  REVIEW OF SYSTEMS:   All systems reviewed.  Pertinent positives include dyspnea, and generalized weakness.  All other systems are negative.  SUBJECTIVE:   Begin typingVITAL SIGNS: BP 120/77 (BP Location: Left Arm)   Pulse (!) 144   Temp 98.2 F (36.8 C) (Oral)   Resp  20   Ht 5\' 7"  (1.702 m)   Wt 159 lb 11.2 oz (72.4 kg)   SpO2 95%   BMI 25.01 kg/m   HEMODYNAMICS:    VENTILATOR SETTINGS: FiO2 (%):  [50 %] 50 %  INTAKE / OUTPUT: I/O last 3 completed shifts: In: 2502.2 [P.O.:1800; I.V.:552.2; IV Piggyback:150] Out: 2200 [Urine:2200]  PHYSICAL EXAMINATION: General:  NAD Neuro:  AAO X 3, CN intact HEENT:  PERRLA, neck is supple, no JVD Cardiovascular: Irregular, tachycardic, S1/S2, no regurge, no edema, +2 pulses Lungs: Normal work of breathing, bilateral breath sounds, +rales and coarse rhonchi RLL, no wheezing Abdomen: +BS X 4,    Musculoskeletal: Positive range of motion, no deformities Skin: Warm and dry  LABS:  BMET Recent Labs  Lab 12/19/16 0525 12/20/16 0540 12/21/16 0514  NA 133* 132* 132*  K 4.7 4.8 3.7  CL 104 102 100*  CO2 20* 20* 22  BUN 43* 55* 56*  CREATININE 1.03* 1.18* 1.26*  GLUCOSE 161* 155* 284*    Electrolytes Recent Labs  Lab 12/19/16 0525 12/20/16 0540 12/21/16 0514  CALCIUM 8.4* 7.9* 7.7*    CBC Recent Labs  Lab 12/19/16 0525 12/19/16 1109 12/20/16 0540 12/21/16 0514  WBC 11.8*  --  8.6 5.1  HGB 12.1 12.1 11.3* 10.8*  HCT 37.3  --  34.4* 33.0*  PLT 228  --  176 131*    Coag's Recent Labs  Lab 12/19/16 0212  APTT 29  INR 1.12    Sepsis Markers No results for input(s): LATICACIDVEN, PROCALCITON, O2SATVEN in the last 168 hours.  ABG No results for input(s): PHART, PCO2ART, PO2ART in the last 168 hours.  Liver Enzymes Recent Labs  Lab 12/17/16 0840  AST 27  ALT 14  ALKPHOS 72  BILITOT 0.9  ALBUMIN 3.6    Cardiac Enzymes Recent Labs  Lab 12/19/16 0525 12/19/16 1109 12/19/16 1718  TROPONINI 6.72* 10.05* 13.33*    Glucose Recent Labs  Lab 12/21/16 1051  GLUCAP 204*    Imaging Dg Chest Port 1 View  Result Date: 12/20/2016 CLINICAL DATA:  83 year old female undergoing chemotherapy for ovarian cancer. Treatment last week with subsequent shortness of breath.  EXAM: PORTABLE CHEST 1 VIEW COMPARISON:  Chest CTA 12/19/2016 and earlier. FINDINGS: Portable AP upright view at 1424 hours. Stable right IJ approach chest porta cath, currently accessed. Increased radiographic opacity at both lung bases in part compatible with the pleural effusions demonstrated by CTA yesterday. Left greater than right lower lobe opacity increase. Increased patchy right perihilar opacity which partially overlaps the right Port-A-Cath. No pneumothorax. Stable cardiac size and mediastinal contours. No pneumothorax. Stable pulmonary vascularity. IMPRESSION: Increased bibasilar and right suprahilar pulmonary opacity since the CTA yesterday appears to reflect a combination of airspace disease and small pleural effusions. Consider acute multifocal infection versus aspiration. Electronically Signed   By: Genevie Ann M.D.   On: 12/20/2016 14:33    STUDIES:  2D echo: Left  ventricular ejection fraction 49-82%, severe systolic dysfunction  CULTURES: None  ANTIBIOTICS: Zosyn  LINES/TUBES: Peripheral IVs  DISCUSSION: This is an 83 year old female currently being treated for ovarian cancer, admitted with non-ST elevation MI, developed an upper GI bleed secondary to anticoagulation, and aspiration pneumonia, now in A. fib with RVR and acute hypoxic respiratory failure.  Spontaneously converted back to sinus tachycardia  ASSESSMENT A. 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  Patient is a full code  - Inter-disciplinary family meet or Palliative Care meeting due by:  day [place] ANP-BC Pulmonary and Critical Care Medicine [facility] Pager [phone] or [phone]  12/21/2016, 11:53 AM   PCCM ATTENDING ATTESTATION:  I have evaluated patient with the APP [place], reviewed database in its entirety and discussed care plan in detail. In addition, this patient was discussed on multidisciplinary rounds.  I agree with the above findings, assessment and plan  Merton Border, MD PCCM service Mobile [phone] Pager [phone] 12/21/2016 3:19 PM

## 2016-12-21 NOTE — Progress Notes (Signed)
Patient noted to have increased work of breathing and concerns for her breathing, patient on 6L nasal cannula. Heart rate slightly elevated in 120's, still in atrial fibrillation on Amiodarone drip. Oxygen saturations ranging from 90-91%. Tachypnea observed with increased work of breathing noted. Spoke with [place] NP of the change of status, she verbally advised to place patient back on high flow nasal cannula. Will continue to monitor and assess patient.

## 2016-12-21 NOTE — Progress Notes (Signed)
Patient ID: Ebony Navarro, female   DOB: [birth year], 83 y.o.   MRN: [id number]  [facility] PROGRESS NOTE  BRADY PLANT UVO:[id number] DOB: February 23, 1933 DOA: 12/19/2016 PCP: Tama High III, MD  HPI/Subjective: Patient went into rapid atrial fibrillation, going in the 140s.  Patient still with some shortness of breath and cough.  Objective: Vitals:   12/21/16 0501 12/21/16 0832  BP: 111/65 120/77  Pulse: (!) 117 (!) 144  Resp: 18 20  Temp: 97.6 F (36.4 C) 98.2 F (36.8 C)  SpO2: 95% 95%    Filed Weights   12/19/16 0130 12/19/16 0522  Weight: 72.6 kg (160 lb) 72.4 kg (159 lb 11.2 oz)    ROS: Review of Systems  Constitutional: Negative for chills and fever.  Eyes: Negative for blurred vision.  Respiratory: Positive for cough and shortness of breath.   Cardiovascular: Negative for chest pain.  Gastrointestinal: Positive for abdominal pain. Negative for constipation, diarrhea, nausea and vomiting.  Genitourinary: Negative for dysuria.  Musculoskeletal: Negative for joint pain.  Neurological: Negative for dizziness and headaches.   Exam: Physical Exam  Constitutional: She is oriented to person, place, and time.  HENT:  Nose: No mucosal edema.  Mouth/Throat: No oropharyngeal exudate or posterior oropharyngeal edema.  Eyes: Conjunctivae, EOM and lids are normal. Pupils are equal, round, and reactive to light.  Neck: No JVD present. Carotid bruit is not present. No edema present. No thyroid mass and no thyromegaly present.  Cardiovascular: S1 normal and S2 normal. Tachycardia present. Exam reveals no gallop.  No murmur heard. Pulses:      Dorsalis pedis pulses are 2+ on the right side, and 2+ on the left side.  Respiratory: No respiratory distress. She has decreased breath sounds in the right middle field, the right lower field, the left middle field and the left lower field. She has wheezes in the left middle field. She has rhonchi in the right lower field  and the left lower field. She has no rales.  GI: Soft. Bowel sounds are normal. She exhibits distension. There is tenderness in the epigastric area.  Musculoskeletal:       Right ankle: She exhibits no swelling.       Left ankle: She exhibits no swelling.  Lymphadenopathy:    She has no cervical adenopathy.  Neurological: She is alert and oriented to person, place, and time. No cranial nerve deficit.  Skin: Skin is warm. No rash noted. Nails show no clubbing.  Psychiatric: She has a normal mood and affect.      Data Reviewed: Basic Metabolic Panel: Recent Labs  Lab 12/17/16 0840 12/19/16 0212 12/19/16 0525 12/20/16 0540 12/21/16 0514  NA 134* 136 133* 132* 132*  K 4.4 4.0 4.7 4.8 3.7  CL 103 106 104 102 100*  CO2 23 20* 20* 20* 22  GLUCOSE 146* 144* 161* 155* 284*  BUN 29* 40* 43* 55* 56*  CREATININE 0.96 0.92 1.03* 1.18* 1.26*  CALCIUM 9.1 7.4* 8.4* 7.9* 7.7*   Liver Function Tests: Recent Labs  Lab 12/17/16 0840  AST 27  ALT 14  ALKPHOS 72  BILITOT 0.9  PROT 7.7  ALBUMIN 3.6   CBC: Recent Labs  Lab 12/17/16 0840 12/19/16 0212 12/19/16 0525 12/19/16 1109 12/20/16 0540 12/21/16 0514  WBC 5.2 11.7* 11.8*  --  8.6 5.1  NEUTROABS 3.4  --   --   --   --   --   HGB 12.1 11.1* 12.1 12.1 11.3* 10.8*  HCT  35.5 34.2* 37.3  --  34.4* 33.0*  MCV 91.4 93.3 93.1  --  92.6 93.4  PLT 234 219 228  --  176 131*   Cardiac Enzymes: Recent Labs  Lab 12/19/16 0212 12/19/16 0525 12/19/16 1109 12/19/16 1718  TROPONINI 3.16* 6.72* 10.05* 13.33*     Studies: Dg Chest Port 1 View  Result Date: 12/20/2016 CLINICAL DATA:  83 year old female undergoing chemotherapy for ovarian cancer. Treatment last week with subsequent shortness of breath. EXAM: PORTABLE CHEST 1 VIEW COMPARISON:  Chest CTA 12/19/2016 and earlier. FINDINGS: Portable AP upright view at 1424 hours. Stable right IJ approach chest porta cath, currently accessed. Increased radiographic opacity at both lung  bases in part compatible with the pleural effusions demonstrated by CTA yesterday. Left greater than right lower lobe opacity increase. Increased patchy right perihilar opacity which partially overlaps the right Port-A-Cath. No pneumothorax. Stable cardiac size and mediastinal contours. No pneumothorax. Stable pulmonary vascularity. IMPRESSION: Increased bibasilar and right suprahilar pulmonary opacity since the CTA yesterday appears to reflect a combination of airspace disease and small pleural effusions. Consider acute multifocal infection versus aspiration. Electronically Signed   By: Genevie Ann M.D.   On: 12/20/2016 14:33    Scheduled Meds: . amiodarone  150 mg Intravenous Once  . aspirin EC  81 mg Oral Daily  . atorvastatin  40 mg Oral Daily  . budesonide (PULMICORT) nebulizer solution  0.5 mg Nebulization BID  . feeding supplement (ENSURE ENLIVE)  237 mL Oral BID BM  . furosemide  40 mg Oral Daily  . ipratropium-albuterol  3 mL Nebulization Q6H  . levothyroxine  50 mcg Oral QAC breakfast  . losartan  25 mg Oral Daily  . metoprolol tartrate  25 mg Oral BID  . multivitamin with minerals  1 tablet Oral Daily  . pantoprazole  40 mg Intravenous Q12H  . sodium chloride flush  3 mL Intravenous Q12H  . sucralfate  1 g Oral TID WC & HS   Continuous Infusions: . sodium chloride    . piperacillin-tazobactam (ZOSYN)  IV 3.375 g (12/21/16 0554)    Assessment/Plan:  1. Atrial fibrillation with rapid ventricular response.  Continue low-dose metoprolol twice daily.  Start amiodarone bolus and drip and transferred to the CCU.   2. Acute hypoxic respiratory failure.  Patient placed on high flow nasal cannula 50% FiO2.   Last chest x-ray concerning for pneumonia versus fluid overload. 3. Aspiration pneumonia on Zosyn 4. Acute systolic congestive heart failure with severe aortic stenosis seen on last echocardiogram.  With these 2 things overall prognosis is poor.  On Lasix orally and  metoprolol. 5. Upper GI bleed with hematemesis.  Hold Decadron.  Stopped heparin drip.   Changed Protonix drip to twice daily dosing.  As per GI okay to start enteric-coated aspirin and Carafate.  Try to advance diet 6. NSTEMI.  Unable to do heparin drip secondary to GI bleed.  As per GI, okay to restart enteric-coated aspirin.  Patient on low-dose metoprolol.  Continue atorvastatin. 7. 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  Ovarian cancer primary.  Patient received her second chemotherapy on Friday.  Case discussed with Dr. Tasia Catchings oncology and she mentioned the chemo agent could increase risk for vascular events. 8. Hyperlipidemia unspecified on atorvastatin 9. Hypothyroidism unspecified. On levothyroxine. 10. full code  Code Status:     Code Status Orders  (From admission, onward)        Start     Ordered   12/19/16 0526  Full code  Continuous     12/19/16 0526    Code Status History    Date Active Date Inactive Code Status Order ID Comments User Context   11/20/2016 16:39 11/22/2016 19:50 Full Code [id number]  Idelle Crouch, MD ED    Advance Directive Documentation     Most Recent Value  Type of Advance Directive  Healthcare Power of Attorney  Pre-existing out of facility DNR order (yellow form or pink MOST form)  No data  "MOST" Form in Place?  No data     Family Communication: spoke with daughter at the bedside Disposition Plan: Transfer to the ICU  Consultants:  Cardiology  Gastroenterology  Oncology  Critical care specialist  Time spent: 35 minutes, critical care time.  Case discussed with nursing staff, cardiologist and intensivist  [place], [facility]

## 2016-12-21 NOTE — Progress Notes (Addendum)
Hematology/Oncology Progress Note [facility] [phone] [phone]  Patient Care Team: Adin Hector, MD as PCP - General (Internal Medicine) Clent Jacks, RN as Registered Nurse   Name of the patient: Ebony Navarro  [id number]  02/16/1933  Date of visit: 12/21/16   INTERVAL HISTORY- Overnight patient developed rapid atrial fibrillation, transferred to ICU. Patient was seen and examined the site. She reports feeling comfortable, denies any chest pain, or shortness of breath. Currently on nasal cannula. His son is at bedside.   Review of systems- Review of Systems  Constitutional: Positive for malaise/fatigue. Negative for chills and fever.  HENT: Negative for hearing loss.   Eyes: Negative for blurred vision.  Respiratory: Negative for cough.   Cardiovascular: Positive for palpitations. Negative for chest pain and leg swelling.  Gastrointestinal: Negative for heartburn.  Genitourinary: Negative for dysuria.  Musculoskeletal: Negative for myalgias.  Skin: Negative for rash.  Neurological: Positive for weakness. Negative for dizziness.  Endo/Heme/Allergies: Does not bruise/bleed easily.  Psychiatric/Behavioral: Negative for depression.    Allergies  Allergen Reactions  . Ace Inhibitors Other (See Comments)    hyperkalemia  . Atenolol Other (See Comments)    Worsening palpitations  . Iodine Other (See Comments)    11/20/16: per conversation with pt, pt with allergy to topical iodine and betadine.  Pt states she has had IV contrast in the past with now issues.  . Metoprolol Tartrate Other (See Comments)    intolerant  . Omeprazole Diarrhea  . Povidone-Iodine Other (See Comments)  . Rofecoxib Nausea Only  . Ciprofloxacin Rash  . Doxycycline Calcium Rash  . Nitrofurantoin Rash  . Quinolones Rash  . Sulfa Antibiotics Rash    Patient Active Problem List   Diagnosis Date Noted  . Acute respiratory failure with  hypoxemia ([place])   . Shortness of breath   . Non-STEMI (non-ST elevated myocardial infarction) ([place]) 12/19/2016  . Ovarian cancer ([place]) 11/24/2016  . Malignant neoplasm of ovary ([place]) 11/24/2016  . Abdominal pain 11/20/2016  . Ascites 11/20/2016  . Ovarian mass 11/20/2016  . Absolute anemia 01/22/2015  . Anxiety 01/22/2015  . Malignant neoplasm of breast ([place]) 01/22/2015  . Clinical depression 01/22/2015  . Gastric catarrh 01/22/2015  . Blood glucose elevated 01/22/2015  . HLD (hyperlipidemia) 01/22/2015  . BP (high blood pressure) 01/22/2015  . Acquired atrophy of thyroid 04/02/2014  . Frequent UTI 11/23/2013  . Chronic kidney disease (CKD), stage III (moderate) ([place]) 10/02/2013     Past Medical History:  Diagnosis Date  . Breast cancer ([place]) 2005   Right, radiation and lumpectomy  . Breast cancer ([place]) 2002   Left, Chemo, radiation and lumpectomy  . Cancer ([place])   . Hyperlipidemia   . Hypertension   . Ovarian cancer ([place]) 11/24/2016     Past Surgical History:  Procedure Laterality Date  . BREAST BIOPSY Left 2010   negative stereotactic biopsy  . BREAST LUMPECTOMY Right 2005   positive  . BREAST LUMPECTOMY Left 2002   positive    Social History   Socioeconomic History  . Marital status: Widowed    Spouse name: Not on file  . Number of children: Not on file  . Years of education: Not on file  . Highest education level: Not on file  Social Needs  . Financial resource strain: Not on file  . Food insecurity - worry: Not on file  . Food insecurity - inability: Not on file  . Transportation needs -  medical: Not on file  . Transportation needs - non-medical: Not on file  Occupational History  . Occupation: retired  Tobacco Use  . Smoking status: Never Smoker  . Smokeless tobacco: Never Used  Substance and Sexual Activity  . Alcohol use: No  . Drug use: No  . Sexual activity: No    Birth control/protection: Post-menopausal  Other Topics Concern  . Not on  file  Social History Narrative  . Not on file     Family History  Problem Relation Age of Onset  . Hypertension Mother   . Hypertension Father      Current Facility-Administered Medications:  .  0.9 %  sodium chloride infusion, 250 mL, Intravenous, PRN, Pyreddy, Pavan, MD .  acetaminophen (TYLENOL) tablet 650 mg, 650 mg, Oral, Q4H PRN, Leslye Peer, Richard, MD .  Margrett Rud amiodarone (NEXTERONE) 1.8 mg/mL load via infusion 150 mg, 150 mg, Intravenous, Once, 150 mg at 12/21/16 1249 **FOLLOWED BY** amiodarone (NEXTERONE PREMIX) 360-4.14 MG/200ML-% (1.8 mg/mL) IV infusion, 60 mg/hr, Intravenous, Continuous, Last Rate: 33.3 mL/hr at 12/21/16 1544, 60 mg/hr at 12/21/16 1544 **FOLLOWED BY** amiodarone (NEXTERONE PREMIX) 360-4.14 MG/200ML-% (1.8 mg/mL) IV infusion, 30 mg/hr, Intravenous, Continuous, Tukov, Magadalene S, NP .  aspirin EC tablet 81 mg, 81 mg, Oral, Daily, Loletha Grayer, MD, 81 mg at 12/21/16 0954 .  atorvastatin (LIPITOR) tablet 40 mg, 40 mg, Oral, Daily, Pyreddy, Pavan, MD, 40 mg at 12/21/16 0954 .  budesonide (PULMICORT) nebulizer solution 0.5 mg, 0.5 mg, Nebulization, BID, Leslye Peer, Richard, MD, 0.5 mg at 12/21/16 0759 .  feeding supplement (ENSURE ENLIVE) (ENSURE ENLIVE) liquid 237 mL, 237 mL, Oral, BID BM, Leslye Peer, Richard, MD, 237 mL at 12/21/16 1251 .  furosemide (LASIX) tablet 40 mg, 40 mg, Oral, Daily, Tukov, Magadalene S, NP, 40 mg at 12/21/16 0954 .  ipratropium-albuterol (DUONEB) 0.5-2.5 (3) MG/3ML nebulizer solution 3 mL, 3 mL, Nebulization, Q6H, Wieting, Richard, MD, 3 mL at 12/21/16 1503 .  levothyroxine (SYNTHROID, LEVOTHROID) tablet 50 mcg, 50 mcg, Oral, QAC breakfast, Pyreddy, Pavan, MD, 50 mcg at 12/21/16 0856 .  losartan (COZAAR) tablet 25 mg, 25 mg, Oral, Daily, Leslye Peer, Richard, MD, 25 mg at 12/21/16 0954 .  metoprolol tartrate (LOPRESSOR) tablet 25 mg, 25 mg, Oral, BID, Clabe Seal, PA-C, 25 mg at 12/21/16 0954 .  morphine 2 MG/ML injection 2 mg, 2 mg,  Intravenous, Q4H PRN, Pyreddy, Pavan, MD .  multivitamin with minerals tablet 1 tablet, 1 tablet, Oral, Daily, Loletha Grayer, MD, 1 tablet at 12/21/16 0954 .  nitroGLYCERIN (NITROSTAT) SL tablet 0.4 mg, 0.4 mg, Sublingual, Q5 Min x 3 PRN, Pyreddy, Pavan, MD .  ondansetron (ZOFRAN) injection 4 mg, 4 mg, Intravenous, Q6H PRN, Pyreddy, Pavan, MD, 4 mg at 12/19/16 1114 .  pantoprazole (PROTONIX) injection 40 mg, 40 mg, Intravenous, Q12H, Loletha Grayer, MD, 40 mg at 12/21/16 0954 .  piperacillin-tazobactam (ZOSYN) IVPB 3.375 g, 3.375 g, Intravenous, Q8H, Wieting, Richard, MD, Last Rate: 12.5 mL/hr at 12/21/16 1405, 3.375 g at 12/21/16 1405 .  sodium chloride flush (NS) 0.9 % injection 3 mL, 3 mL, Intravenous, Q12H, Pyreddy, Pavan, MD, 3 mL at 12/20/16 2235 .  sodium chloride flush (NS) 0.9 % injection 3 mL, 3 mL, Intravenous, PRN, Pyreddy, Pavan, MD .  sucralfate (CARAFATE) 1 GM/10ML suspension 1 g, 1 g, Oral, TID WC & HS, Loletha Grayer, MD, 1 g at 12/21/16 1352   Physical exam:  Vitals:   12/20/16 2020 12/21/16 0501 12/21/16 0759 12/21/16 0832  BP:  111/65  120/77  Pulse:  (!) 117  (!) 144  Resp:  18  20  Temp:  97.6 F (36.4 C)  98.2 F (36.8 C)  TempSrc:  Oral  Oral  SpO2: 92% 95% 94% 95%  Weight:      Height:           CMP Latest Ref Rng & Units 12/21/2016  Glucose 65 - 99 mg/dL 284(H)  BUN 6 - 20 mg/dL 56(H)  Creatinine 0.44 - 1.00 mg/dL 1.26(H)  Sodium 135 - 145 mmol/L 132(L)  Potassium 3.5 - 5.1 mmol/L 3.7  Chloride 101 - 111 mmol/L 100(L)  CO2 22 - 32 mmol/L 22  Calcium 8.9 - 10.3 mg/dL 7.7(L)  Total Protein 6.5 - 8.1 g/dL -  Total Bilirubin 0.3 - 1.2 mg/dL -  Alkaline Phos 38 - 126 U/L -  AST 15 - 41 U/L -  ALT 14 - 54 U/L -   CBC Latest Ref Rng & Units 12/21/2016  WBC 3.6 - 11.0 K/uL 5.1  Hemoglobin 12.0 - 16.0 g/dL 10.8(L)  Hematocrit 35.0 - 47.0 % 33.0(L)  Platelets 150 - 440 K/uL 131(L)    @IMAGES @  Dg Chest 2 View  Result Date:  12/19/2016 CLINICAL DATA:  Central chest pain and shortness of breath yesterday. History of ovarian cancer, breast cancer, on chemotherapy. EXAM: CHEST  2 VIEW COMPARISON:  CT chest November 20, 2016 FINDINGS: The cardiac silhouette is mildly enlarged and unchanged. Calcified aortic knob. Small pleural effusions. No focal consolidation. RIGHT single-lumen chest Port-A-Cath distal tip projects in mid superior vena cava. No pneumothorax. Soft tissue planes and included osseous structures are nonsuspicious. Calcifications in the neck are likely vascular. Osteopenia. IMPRESSION: Mild cardiomegaly.  Small pleural effusions. Aortic Atherosclerosis (ICD10-I70.0). Electronically Signed   By: Elon Alas M.D.   On: 12/19/2016 02:00   Ct Angio Chest Pe W/cm &/or Wo Cm  Result Date: 12/19/2016 CLINICAL DATA:  Acute onset of central chest pain and shortness of breath. Current history of ovarian cancer, status post recent chemotherapy. EXAM: CT ANGIOGRAPHY CHEST WITH CONTRAST TECHNIQUE: Multidetector CT imaging of the chest was performed using the standard protocol during bolus administration of intravenous contrast. Multiplanar CT image reconstructions and MIPs were obtained to evaluate the vascular anatomy. CONTRAST:  36mL ISOVUE-370 IOPAMIDOL (ISOVUE-370) INJECTION 76% COMPARISON:  Chest radiograph performed earlier today at 1:41 a.m., and CTA of the chest performed 11/20/2016 FINDINGS: Cardiovascular:  There is no evidence of pulmonary embolus. The heart is borderline normal in size. Diffuse coronary artery calcifications are seen. Calcification is noted at the aortic valve. Scattered calcification is seen along the aortic arch and descending thoracic aorta, and at the great vessels. Borderline prominence of the ascending thoracic aorta is thought to be within normal limits given the patient's age. Mediastinum/Nodes: An enlarged subcarinal node is suggested, measuring 2.0 cm in short axis. Previously noted  esophageal wall thickening has largely resolved. A small residual hiatal hernia is seen. No pericardial effusion is identified. The thyroid gland is grossly unremarkable. No axillary lymphadenopathy is seen. A right-sided chest port is noted. Lungs/Pleura: Small bilateral pleural effusions are noted, with associated bibasilar atelectasis. No pneumothorax is seen. No definite mass is identified. Upper Abdomen: The visualized portions of the liver and the spleen are unremarkable. Small stones are noted dependently within the gallbladder. The visualized portions of the adrenal glands and kidneys are grossly unremarkable. There is a 3.4 cm cystic mass at the tail of the pancreas, as noted on prior CT. Musculoskeletal: No acute  osseous abnormalities are identified. Multilevel endplate sclerotic change is noted along the thoracic and upper lumbar spine, with vacuum phenomenon. The visualized musculature is unremarkable in appearance. Review of the MIP images confirms the above findings. IMPRESSION: 1. No evidence of pulmonary embolus. 2. Small bilateral pleural effusions, with bibasilar atelectasis. 3. Diffuse coronary artery calcifications. 4. Enlarged subcarinal node, measuring 2.0 cm in short axis, may reflect the patient's malignancy or may be chronic in nature, though mildly more prominent than on the recent prior CTA. 5. Small residual hiatal hernia noted. 6. Cholelithiasis.  Gallbladder otherwise unremarkable. 7. 3.4 cm cystic mass again noted at the tail of the pancreas; this is relatively stable in appearance. Electronically Signed   By: Garald Balding M.D.   On: 12/19/2016 04:20   US Paracentesis  Result Date: 11/22/2016 INDICATION: Ascites EXAM: ULTRASOUND GUIDED PARACENTESIS MEDICATIONS: None. COMPLICATIONS: None immediate. PROCEDURE: Informed written consent was obtained from the patient after a discussion of the risks, benefits and alternatives to treatment. A timeout was performed prior to the  initiation of the procedure. Initial ultrasound scanning demonstrates a large amount of ascites within the right lower abdominal quadrant. The right lower abdomen was prepped and draped in the usual sterile fashion. 1% lidocaine was used for local anesthesia. Following this, a 6 Fr Safe-T-Centesis catheter was introduced. An ultrasound image was saved for documentation purposes. The paracentesis was performed. The catheter was removed and a dressing was applied. The patient tolerated the procedure well without immediate post procedural complication. FINDINGS: A total of approximately 4.7 L of yellow fluid was removed. Samples were sent to the laboratory as requested by the clinical team. IMPRESSION: Successful ultrasound-guided paracentesis yielding 4.7 liters of peritoneal fluid. Electronically Signed   By: Inez Catalina M.D.   On: 11/22/2016 12:15   Dg Chest Port 1 View  Result Date: 12/20/2016 CLINICAL DATA:  83 year old female undergoing chemotherapy for ovarian cancer. Treatment last week with subsequent shortness of breath. EXAM: PORTABLE CHEST 1 VIEW COMPARISON:  Chest CTA 12/19/2016 and earlier. FINDINGS: Portable AP upright view at 1424 hours. Stable right IJ approach chest porta cath, currently accessed. Increased radiographic opacity at both lung bases in part compatible with the pleural effusions demonstrated by CTA yesterday. Left greater than right lower lobe opacity increase. Increased patchy right perihilar opacity which partially overlaps the right Port-A-Cath. No pneumothorax. Stable cardiac size and mediastinal contours. No pneumothorax. Stable pulmonary vascularity. IMPRESSION: Increased bibasilar and right suprahilar pulmonary opacity since the CTA yesterday appears to reflect a combination of airspace disease and small pleural effusions. Consider acute multifocal infection versus aspiration. Electronically Signed   By: Genevie Ann M.D.   On: 12/20/2016 14:33    Assessment and plan- Patient  is a 83 y.o. female with remote history of breast cancer, recently diagnosed with stage III ovarian cancer being treated with curative intent status,  post 2 cycles of carboplatin + Taxol+ the Avastin, planned debulking surgery after 4 cycles of chemotherapy treatment, presented with shortness of breath and chest discomfort.  # NSTEMI/Afib/hypotension:     Manage per ICU and cardiology.    Avastin is associated with 6% risk of arterial thrombosis, may contribute to her NSTEMI. I discussed with patient and her daughter yesterday.  # Coffee-ground emesis: Continue IV PPI. Agree with GI that if no overt signs of bleeding, restart aspirin. Deferred GI for need of EGD. # Stage III ovarian cancer currently on chemotherapy treatment: tumor marker significantly improved after chemotherapy treatment and clinically her ascites  has improved.  no need paracentesis. I discussed with patient that currently she needs to focus on recover from heart failure. She doesn't need any chemotherapy right now. If she fully recovers, she can follow up outpatient for reevaluation if she is a candidate for additional chemotherapy.   # CODE STATUS discussion: CODE STATUS was discussed at the bedside yesterday with patient and her daughter. They want to think about that and update [place].  Today I  Discussed with patient and her son at the bedside today again regarding his CODE STATUS. She is currently related listed as full code. Patient clearly stated that she does not want resuscitation, want to be DNR/DNI.    Updated ICU nurse and Dr.Kasa.   Will continue follow along her inpatient course.  Total face to face encounter time for this patient visit was 35 min. >50% of the time was  spent in counseling and coordination of care.     Dr. Earlie Server, MD, PhD [facility] Pager- [phone] 12/21/2016

## 2016-12-21 NOTE — Progress Notes (Signed)
Central Telemetry called to notify this RN that the patient had converted to A-fib with RVR of 140s-160s.  Dr. Earleen Newport notified immediately as he was at the desk.  Patient assessed.  Vitals taken; other vitals were stable.  Dr. Earleen Newport recommended amiodarone bolus and drip and move to ICU.  Patient remains in A-fib with RVR.  Patient has no complaints at this time.

## 2016-12-21 NOTE — Progress Notes (Signed)
RT called to room for pt desat below 90%, pt placed back on HHF 30LPM 50% at this time

## 2016-12-21 NOTE — Progress Notes (Signed)
[facility] Cardiology  SUBJECTIVE: Patient denies experiencing palpitations or feeling her heart race. She denies chest pain. Some shortness of breath.   Vitals:   12/20/16 2009 12/20/16 2020 12/21/16 0501 12/21/16 0832  BP: 108/66  111/65 120/77  Pulse: (!) 112  (!) 117 (!) 144  Resp:   18 20  Temp: 97.9 F (36.6 C)  97.6 F (36.4 C) 98.2 F (36.8 C)  TempSrc: Oral  Oral Oral  SpO2: 90% 92% 95% 95%  Weight:      Height:         Intake/Output Summary (Last 24 hours) at 12/21/2016 0843 Last data filed at 12/21/2016 0600 Gross per 24 hour  Intake 2502.17 ml  Output 1300 ml  Net 1202.17 ml      PHYSICAL EXAM  General: Chronically-ill appearing, elderly female, eating breakfast in bed, in no acute distress HEENT:  Normocephalic and atramatic Neck:  Supple Lungs: Normal effort of breathing on supplemental oxygen Heart: Irregularly irregular Abdomen: Bowel sounds are positive Msk:  Back normal, able to sit upright in bed, gait not assessed. Apparent normal strength and tone for age. Extremities: No clubbing, cyanosis or edema.   Neuro: Alert and oriented X 3. Psych:  Good affect, responds appropriately   LABS: Basic Metabolic Panel: Recent Labs    12/20/16 0540 12/21/16 0514  NA 132* 132*  K 4.8 3.7  CL 102 100*  CO2 20* 22  GLUCOSE 155* 284*  BUN 55* 56*  CREATININE 1.18* 1.26*  CALCIUM 7.9* 7.7*   Liver Function Tests: No results for input(s): AST, ALT, ALKPHOS, BILITOT, PROT, ALBUMIN in the last 72 hours. No results for input(s): LIPASE, AMYLASE in the last 72 hours. CBC: Recent Labs    12/20/16 0540 12/21/16 0514  WBC 8.6 5.1  HGB 11.3* 10.8*  HCT 34.4* 33.0*  MCV 92.6 93.4  PLT 176 131*   Cardiac Enzymes: Recent Labs    12/19/16 0525 12/19/16 1109 12/19/16 1718  TROPONINI 6.72* 10.05* 13.33*   BNP: Invalid input(s): POCBNP D-Dimer: No results for input(s): DDIMER in the last 72 hours. Hemoglobin A1C: No results for input(s): HGBA1C in the  last 72 hours. Fasting Lipid Panel: Recent Labs    12/19/16 0525  CHOL 154  HDL 52  LDLCALC 82  TRIG 101  CHOLHDL 3.0   Thyroid Function Tests: No results for input(s): TSH, T4TOTAL, T3FREE, THYROIDAB in the last 72 hours.  Invalid input(s): FREET3 Anemia Panel: No results for input(s): VITAMINB12, FOLATE, FERRITIN, TIBC, IRON, RETICCTPCT in the last 72 hours.  Dg Chest Port 1 View  Result Date: 12/20/2016 CLINICAL DATA:  83 year old female undergoing chemotherapy for ovarian cancer. Treatment last week with subsequent shortness of breath. EXAM: PORTABLE CHEST 1 VIEW COMPARISON:  Chest CTA 12/19/2016 and earlier. FINDINGS: Portable AP upright view at 1424 hours. Stable right IJ approach chest porta cath, currently accessed. Increased radiographic opacity at both lung bases in part compatible with the pleural effusions demonstrated by CTA yesterday. Left greater than right lower lobe opacity increase. Increased patchy right perihilar opacity which partially overlaps the right Port-A-Cath. No pneumothorax. Stable cardiac size and mediastinal contours. No pneumothorax. Stable pulmonary vascularity. IMPRESSION: Increased bibasilar and right suprahilar pulmonary opacity since the CTA yesterday appears to reflect a combination of airspace disease and small pleural effusions. Consider acute multifocal infection versus aspiration. Electronically Signed   By: Genevie Ann M.D.   On: 12/20/2016 14:33      Echo EF 25-30%   TELEMETRY: atrial fibrillation, rate  140s  ASSESSMENT AND PLAN:  Active Problems:   Non-STEMI (non-ST elevated myocardial infarction) (H[facility])   Acute respiratory failure with hypoxemia (H[facility])   Shortness of breath    1. New-onset atrial fibrillation with RVR, in the setting of acute systolic congestive heart failure, acute hypoxic respiratory failure, NSTEMI, upper GI bleed, and metastatic ovarian cancer. Patient denies palpitations.  2. NSTEMI, peak troponin 10.05, recent echo  reveals severely reduced LV function of 25-30% as compared to echo in 11/2016, which was normal. The patient has not had chest pain. Not a candidate for cardiac catheterization with concurrent GI bleed while on heparin, and hypoxic respiratory failure. 3. Aortic stenosis, moderate to severe 4. Acute systolic CHF, EF 10-62% 5. Upper GI bleed while on heparin, which was discontinued 6. 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 Agree with overall therapy. 2. Amiodarone bolus and drip for atrial fibrillation with RVR 3. Continue metoprolol 4. Defer chronic anticoagulation at this time as patient has acute upper GI bleed 5. Not a candidate for cardioversion at this time 6. Defer cardiac catheterization with concurrent GI bleed   Clabe Seal, PA-C 12/21/2016 8:43 AM

## 2016-12-22 ENCOUNTER — Inpatient Hospital Stay: Payer: Medicare Other

## 2016-12-22 DIAGNOSIS — D696 Thrombocytopenia, unspecified: Secondary | ICD-10-CM

## 2016-12-22 DIAGNOSIS — J810 Acute pulmonary edema: Secondary | ICD-10-CM

## 2016-12-22 DIAGNOSIS — I214 Non-ST elevation (NSTEMI) myocardial infarction: Principal | ICD-10-CM

## 2016-12-22 LAB — BASIC METABOLIC PANEL
ANION GAP: 15 (ref 5–15)
BUN: 70 mg/dL — ABNORMAL HIGH (ref 6–20)
CO2: 20 mmol/L — ABNORMAL LOW (ref 22–32)
Calcium: 7.9 mg/dL — ABNORMAL LOW (ref 8.9–10.3)
Chloride: 95 mmol/L — ABNORMAL LOW (ref 101–111)
Creatinine, Ser: 2 mg/dL — ABNORMAL HIGH (ref 0.44–1.00)
GFR calc Af Amer: 26 mL/min — ABNORMAL LOW (ref >60)
GFR, EST NON AFRICAN AMERICAN: 22 mL/min — AB (ref >60)
Glucose, Bld: 168 mg/dL — ABNORMAL HIGH (ref 65–99)
POTASSIUM: 4.2 mmol/L (ref 3.5–5.1)
SODIUM: 130 mmol/L — AB (ref 135–145)

## 2016-12-22 LAB — CBC
HCT: 31.3 % — ABNORMAL LOW (ref 35.0–47.0)
Hemoglobin: 10.1 g/dL — ABNORMAL LOW (ref 12.0–16.0)
MCH: 30 pg (ref 26.0–34.0)
MCHC: 32.2 g/dL (ref 32.0–36.0)
MCV: 93.3 fL (ref 80.0–100.0)
PLATELETS: 102 10*3/uL — AB (ref 150–440)
RBC: 3.35 MIL/uL — ABNORMAL LOW (ref 3.80–5.20)
RDW: 16 % — ABNORMAL HIGH (ref 11.5–14.5)
WBC: 3.1 10*3/uL — AB (ref 3.6–11.0)

## 2016-12-22 LAB — PROCALCITONIN: PROCALCITONIN: 0.51 ng/mL

## 2016-12-22 LAB — TROPONIN I: Troponin I: 4.18 ng/mL (ref <0.03)

## 2016-12-22 MED ORDER — SODIUM CHLORIDE 0.9 % IV SOLN
0.0000 ug/min | INTRAVENOUS | Status: DC
  Administered 2016-12-22: 30 ug/min via INTRAVENOUS
  Administered 2016-12-22: 10 ug/min via INTRAVENOUS
  Administered 2016-12-23: 30 ug/min via INTRAVENOUS
  Filled 2016-12-22: qty 1
  Filled 2016-12-22 (×3): qty 10

## 2016-12-22 MED ORDER — IPRATROPIUM-ALBUTEROL 0.5-2.5 (3) MG/3ML IN SOLN
3.0000 mL | RESPIRATORY_TRACT | Status: DC | PRN
  Administered 2016-12-23 – 2016-12-31 (×12): 3 mL via RESPIRATORY_TRACT
  Filled 2016-12-22 (×13): qty 3

## 2016-12-22 MED ORDER — SODIUM CHLORIDE 0.9 % IV BOLUS (SEPSIS)
250.0000 mL | Freq: Once | INTRAVENOUS | Status: AC
  Administered 2016-12-22: 250 mL via INTRAVENOUS

## 2016-12-22 MED ORDER — DIPHENHYDRAMINE HCL 25 MG PO TABS
25.0000 mg | ORAL_TABLET | Freq: Once | ORAL | Status: AC
  Administered 2016-12-22: 25 mg via ORAL
  Filled 2016-12-22: qty 1

## 2016-12-22 NOTE — Progress Notes (Signed)
No distress No new complaints Over course of today, has transitioned to conventional [place] O2 and tolerating well  Vitals:   12/22/16 1100 12/22/16 1200 12/22/16 1300 12/22/16 1400  BP: (!) 88/57 (!) 90/58 (!) 89/52 (!) 86/53  Pulse: 99 96 91 90  Resp: (!) 21 (!) 29 (!) 36 (!) 30  Temp:   97.9 F (36.6 C)   TempSrc:   Oral   SpO2: 94% 96% 96% 96%  Weight:      Height:         Gen: WDWN in NAD HEENT: NCAT, sclerae white, oropharynx normal Neck: No LAN, no JVD noted Lungs: full BS, minimal bibasilar crackles Cardiovascular: Reg, no M noted Abdomen: Soft, NT, +BS Ext: no C/C/E Neuro: PERRL, EOMI, motor/sensory grossly intact Skin: No lesions noted  BMP Latest Ref Rng & Units 12/22/2016 12/21/2016 12/20/2016  Glucose 65 - 99 mg/dL 168(H) 284(H) 155(H)  BUN 6 - 20 mg/dL 70(H) 56(H) 55(H)  Creatinine 0.44 - 1.00 mg/dL 2.00(H) 1.26(H) 1.18(H)  Sodium 135 - 145 mmol/L 130(L) 132(L) 132(L)  Potassium 3.5 - 5.1 mmol/L 4.2 3.7 4.8  Chloride 101 - 111 mmol/L 95(L) 100(L) 102  CO2 22 - 32 mmol/L 20(L) 22 20(L)  Calcium 8.9 - 10.3 mg/dL 7.9(L) 7.7(L) 7.9(L)    CBC Latest Ref Rng & Units 12/22/2016 12/21/2016 12/20/2016  WBC 3.6 - 11.0 K/uL 3.1(L) 5.1 8.6  Hemoglobin 12.0 - 16.0 g/dL 10.1(L) 10.8(L) 11.3(L)  Hematocrit 35.0 - 47.0 % 31.3(L) 33.0(L) 34.4(L)  Platelets 150 - 440 K/uL 102(L) 131(L) 176    CXR: mild edema pattern  IMPRESSION: NSTEMI AFRVR > NSR Pulmonary edema - clinically improving Acute hypoxemic respiratory failure Doubt PNA or other active infection  PLAN/REC: Transfer to telemetry ordered Continue supplemental O2 to maintain SpO2 90-96% Continue amiodarone per cardiology Discontinue Zosyn Change bronchodilators to PRN only  After transfer, PCCM will sign off. Please call if we can be of further assistance    Merton Border, MD PCCM service Mobile [phone] Pager [phone] 12/22/2016 3:10 PM

## 2016-12-22 NOTE — Progress Notes (Signed)
Pharmacy Antibiotic Note  Ebony Navarro is a 83 y.o. female admitted on 12/19/2016 with aspiration pneumonia.  Pharmacy has been consulted for Zosyn dosing. CXR with acute multifocal infection versus aspiration. Holding off on vancomycin for now per hospitalist and seeing how pt does.   Plan: Continue Zosyn 3.375 g EI q 8 hours.   Height: 5\' 7"  (170.2 cm) Weight: 159 lb 11.2 oz (72.4 kg) IBW/kg (Calculated) : 61.6  Temp (24hrs), Avg:98.1 F (36.7 C), Min:97.8 F (36.6 C), Max:98.3 F (36.8 C)  Recent Labs  Lab 12/19/16 0212 12/19/16 0525 12/20/16 0540 12/21/16 0514 12/22/16 0524  WBC 11.7* 11.8* 8.6 5.1 3.1*  CREATININE 0.92 1.03* 1.18* 1.26* 2.00*    Estimated Creatinine Clearance: 21.5 mL/min (A) (by C-G formula based on SCr of 2 mg/dL (H)).    Allergies  Allergen Reactions  . Ace Inhibitors Other (See Comments)    hyperkalemia  . Atenolol Other (See Comments)    Worsening palpitations  . Iodine Other (See Comments)    11/20/16: per conversation with pt, pt with allergy to topical iodine and betadine.  Pt states she has had IV contrast in the past with now issues.  . Metoprolol Tartrate Other (See Comments)    intolerant  . Omeprazole Diarrhea  . Povidone-Iodine Other (See Comments)  . Rofecoxib Nausea Only  . Ciprofloxacin Rash  . Doxycycline Calcium Rash  . Nitrofurantoin Rash  . Quinolones Rash  . Sulfa Antibiotics Rash    Antimicrobials this admission: Zosyn 11/12 >>  Dose adjustments this admission:   Microbiology results: 11/13 MRSA PCR: negative  Thank you for allowing pharmacy to be a part of this patient's care.  Ulice Dash D 12/22/2016 12:50 PM

## 2016-12-22 NOTE — Progress Notes (Signed)
Patient ID: Ebony Navarro, female   DOB: April 27, 1933, 83 y.o.   MRN: [id number]  [facility] PROGRESS NOTE  Ebony Navarro MVH:[id number] DOB: [birth year] DOA: 12/19/2016 PCP: Tama High III, MD  HPI/Subjective: Mild shortness of breath and cough. On O2 [place] 6 L. On amiodarone drip. BP is 84/53.  Objective: Vitals:   12/22/16 1500 12/22/16 1600  BP: (!) 79/52 (!) 84/53  Pulse: 88 86  Resp: (!) 35 (!) 23  Temp:  98.2 F (36.8 C)  SpO2: 96% 96%    Filed Weights   12/19/16 0130 12/19/16 0522  Weight: 160 lb (72.6 kg) 159 lb 11.2 oz (72.4 kg)    ROS: Review of Systems  Constitutional: Negative for chills and fever.  Eyes: Negative for blurred vision.  Respiratory: Positive for cough and shortness of breath.   Cardiovascular: Negative for chest pain.  Gastrointestinal: Negative for abdominal pain, constipation, diarrhea, nausea and vomiting.  Genitourinary: Negative for dysuria.  Musculoskeletal: Negative for joint pain.  Neurological: Negative for dizziness and headaches.   Exam: Physical Exam  Constitutional: She is oriented to person, place, and time. She appears well-developed.  HENT:  Head: Normocephalic.  Nose: No mucosal edema.  Mouth/Throat: No oropharyngeal exudate or posterior oropharyngeal edema.  Eyes: Conjunctivae, EOM and lids are normal. Pupils are equal, round, and reactive to light.  Neck: No JVD present. Carotid bruit is not present. No edema present. No thyroid mass and no thyromegaly present.  Cardiovascular: Normal rate, S1 normal and S2 normal. Exam reveals no gallop.  Murmur heard. Pulses:      Dorsalis pedis pulses are 2+ on the right side, and 2+ on the left side.  Respiratory: No respiratory distress. She has decreased breath sounds in the right middle field, the right lower field, the left middle field and the left lower field. She has wheezes in the left middle field. She has rhonchi in the right lower field and the left lower  field. She has no rales.  GI: Soft. Bowel sounds are normal. She exhibits no distension. There is no tenderness.  Musculoskeletal:       Right ankle: She exhibits no swelling.       Left ankle: She exhibits no swelling.  Lymphadenopathy:    She has no cervical adenopathy.  Neurological: She is alert and oriented to person, place, and time. No cranial nerve deficit.  Skin: Skin is warm. No rash noted. Nails show no clubbing.  Psychiatric: She has a normal mood and affect.      Data Reviewed: Basic Metabolic Panel: Recent Labs  Lab 12/19/16 0212 12/19/16 0525 12/20/16 0540 12/21/16 0514 12/21/16 1307 12/22/16 0524  NA 136 133* 132* 132*  --  130*  K 4.0 4.7 4.8 3.7  --  4.2  CL 106 104 102 100*  --  95*  CO2 20* 20* 20* 22  --  20*  GLUCOSE 144* 161* 155* 284*  --  168*  BUN 40* 43* 55* 56*  --  70*  CREATININE 0.92 1.03* 1.18* 1.26*  --  2.00*  CALCIUM 7.4* 8.4* 7.9* 7.7*  --  7.9*  MG  --   --   --   --  2.2  --   PHOS  --   --   --   --  3.0  --    Liver Function Tests: Recent Labs  Lab 12/17/16 0840  AST 27  ALT 14  ALKPHOS 72  BILITOT 0.9  PROT 7.7  ALBUMIN 3.6   CBC: Recent Labs  Lab 12/17/16 0840 12/19/16 0212 12/19/16 0525 12/19/16 1109 12/20/16 0540 12/21/16 0514 12/22/16 0524  WBC 5.2 11.7* 11.8*  --  8.6 5.1 3.1*  NEUTROABS 3.4  --   --   --   --   --   --   HGB 12.1 11.1* 12.1 12.1 11.3* 10.8* 10.1*  HCT 35.5 34.2* 37.3  --  34.4* 33.0* 31.3*  MCV 91.4 93.3 93.1  --  92.6 93.4 93.3  PLT 234 219 228  --  176 131* 102*   Cardiac Enzymes: Recent Labs  Lab 12/19/16 0212 12/19/16 0525 12/19/16 1109 12/19/16 1718 12/22/16 1721  TROPONINI 3.16* 6.72* 10.05* 13.33* 4.18*     Studies: Dg Chest Port 1 View  Result Date: 12/22/2016 CLINICAL DATA:  Respiratory failure EXAM: PORTABLE CHEST 1 VIEW COMPARISON:  December 20, 2016 FINDINGS: Port-A-Cath tip is in the superior vena cava. No pneumothorax. There are small pleural effusions  bilaterally. There is no edema or consolidation. There is cardiomegaly with pulmonary venous hypertension. No adenopathy. There is aortic atherosclerosis. There is evidence of old trauma involving the lateral left clavicle, stable. IMPRESSION: Pulmonary vascular congestion with small pleural effusions bilaterally. No edema or consolidation evident. There is aortic atherosclerosis. No pneumothorax. Aortic Atherosclerosis (ICD10-I70.0). Electronically Signed   By: Lowella Grip III M.D.   On: 12/22/2016 07:07    Scheduled Meds: . aspirin EC  81 mg Oral Daily  . atorvastatin  40 mg Oral Daily  . budesonide (PULMICORT) nebulizer solution  0.5 mg Nebulization BID  . feeding supplement (ENSURE ENLIVE)  237 mL Oral BID BM  . furosemide  40 mg Oral Daily  . levothyroxine  50 mcg Oral QAC breakfast  . metoprolol tartrate  25 mg Oral BID  . multivitamin with minerals  1 tablet Oral Daily  . sodium chloride flush  3 mL Intravenous Q12H  . sucralfate  1 g Oral TID WC & HS   Continuous Infusions: . sodium chloride    . amiodarone 30 mg/hr (12/22/16 1220)  . phenylephrine (NEO-SYNEPHRINE) Adult infusion 10 mcg/min (12/22/16 1708)    Assessment/Plan:  1. Atrial fibrillation with rapid ventricular response.  Continue low-dose metoprolol twice daily and amiodarone drip.  2. Acute hypoxic respiratory failure.  Back on high flow nasal cannula 50% FiO2.   Last chest x-ray concerning for pneumonia versus fluid overload. NEB. 3. Aspiration pneumonia on Zosyn 4. Acute systolic congestive heart failure with severe aortic stenosisLV EF: 25% -   30%.  Lasix orally and metoprolol if BP allows. 5. Upper GI bleed with hematemesis.  Hold Decadron.  Stopped heparin drip.   Changed Protonix drip to twice daily dosing.  As per GI okay to start enteric-coated aspirin and Carafate.  Try to advance diet 6. NSTEMI.  Unable to do heparin drip secondary to GI bleed.  As per GI, okay to restart enteric-coated aspirin.   Patient on low-dose metoprolol.  Continue atorvastatin. 7. 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  Ovarian cancer primary.  Patient received her second chemotherapy on Friday.   Per Dr. Tasia Catchings,  If she fully recovers, she can follow up outpatient for reevaluation if she is a candidate for additional chemotherapy.  8. Hyperlipidemia unspecified on atorvastatin 9. Hypothyroidism unspecified. On levothyroxine. 10.  Hypotension, possible due to cardiogenic etiology.  Hold Lasix and Lopressor if BP is low. 11. Acute renal failure.  Follow-up BMP.   Code Status:     Code Status Orders  (  From admission, onward)        Start     Ordered   12/19/16 0526  Full code  Continuous     12/19/16 0526    Code Status History    Date Active Date Inactive Code Status Order ID Comments User Context   11/20/2016 16:39 11/22/2016 19:50 Full Code [id number]  Idelle Crouch, MD ED    Advance Directive Documentation     Most Recent Value  Type of Advance Directive  Healthcare Power of Attorney  Pre-existing out of facility DNR order (yellow form or pink MOST form)  No data  "MOST" Form in Place?  No data     Family Communication: spoke with her son at the bedside Disposition Plan: to be determined.  Consultants:  Cardiology  Gastroenterology  Oncology  Critical care specialist  Time spent: 43 minutes. Case discussed with ICU NP Hinton Dyer.  Demetrios Loll  [employer]

## 2016-12-22 NOTE — Progress Notes (Addendum)
Hematology/Oncology Progress Note [facility] [phone] [phone]  Patient Care Team: Adin Hector, MD as PCP - General (Internal Medicine) Clent Jacks, RN as Registered Nurse   Name of the patient: Ebony Navarro  [id number]  [birth year]  Date of visit: 12/22/16   INTERVAL HISTORY- Patient reports feeling same as yesterday. Denies any pain or belching. She remains borderline hypotensive. Currently on nasal cannula. Still have Shortness of breath.   Review of systems- Review of Systems  Constitutional: Positive for malaise/fatigue. Negative for chills and fever.  HENT: Negative for hearing loss.   Eyes: Negative for blurred vision.  Respiratory: Positive for shortness of breath. Negative for cough.   Cardiovascular: Negative for chest pain, palpitations and leg swelling.  Gastrointestinal: Positive for nausea. Negative for heartburn.  Genitourinary: Negative for dysuria.  Musculoskeletal: Negative for myalgias.  Skin: Negative for rash.  Neurological: Positive for weakness. Negative for dizziness.  Endo/Heme/Allergies: Does not bruise/bleed easily.  Psychiatric/Behavioral: Negative for depression.    Allergies  Allergen Reactions  . Ace Inhibitors Other (See Comments)    hyperkalemia  . Atenolol Other (See Comments)    Worsening palpitations  . Iodine Other (See Comments)    11/20/16: per conversation with pt, pt with allergy to topical iodine and betadine.  Pt states she has had IV contrast in the past with now issues.  . Metoprolol Tartrate Other (See Comments)    intolerant  . Omeprazole Diarrhea  . Povidone-Iodine Other (See Comments)  . Rofecoxib Nausea Only  . Ciprofloxacin Rash  . Doxycycline Calcium Rash  . Nitrofurantoin Rash  . Quinolones Rash  . Sulfa Antibiotics Rash    Patient Active Problem List   Diagnosis Date Noted  . Acute respiratory failure with hypoxemia ([place])   . Shortness of  breath   . Non-STEMI (non-ST elevated myocardial infarction) ([place]) 12/19/2016  . Ovarian cancer ([place]) 11/24/2016  . Malignant neoplasm of ovary ([place]) 11/24/2016  . Abdominal pain 11/20/2016  . Ascites 11/20/2016  . Ovarian mass 11/20/2016  . Absolute anemia 01/22/2015  . Anxiety 01/22/2015  . Malignant neoplasm of breast ([place]) 01/22/2015  . Clinical depression 01/22/2015  . Gastric catarrh 01/22/2015  . Blood glucose elevated 01/22/2015  . HLD (hyperlipidemia) 01/22/2015  . BP (high blood pressure) 01/22/2015  . Acquired atrophy of thyroid 04/02/2014  . Frequent UTI 11/23/2013  . Chronic kidney disease (CKD), stage III (moderate) ([place]) 10/02/2013     Past Medical History:  Diagnosis Date  . Breast cancer ([place]) 2005   Right, radiation and lumpectomy  . Breast cancer ([place]) 2002   Left, Chemo, radiation and lumpectomy  . Cancer ([place])   . Hyperlipidemia   . Hypertension   . Ovarian cancer ([place]) 11/24/2016     Past Surgical History:  Procedure Laterality Date  . BREAST BIOPSY Left 2010   negative stereotactic biopsy  . BREAST LUMPECTOMY Right 2005   positive  . BREAST LUMPECTOMY Left 2002   positive    Social History   Socioeconomic History  . Marital status: Widowed    Spouse name: Not on file  . Number of children: Not on file  . Years of education: Not on file  . Highest education level: Not on file  Social Needs  . Financial resource strain: Not on file  . Food insecurity - worry: Not on file  . Food insecurity - inability: Not on file  . Transportation needs - medical: Not on file  . Transportation  needs - non-medical: Not on file  Occupational History  . Occupation: retired  Tobacco Use  . Smoking status: Never Smoker  . Smokeless tobacco: Never Used  Substance and Sexual Activity  . Alcohol use: No  . Drug use: No  . Sexual activity: No    Birth control/protection: Post-menopausal  Other Topics Concern  . Not on file  Social History Narrative    . Not on file     Family History  Problem Relation Age of Onset  . Hypertension Mother   . Hypertension Father      Current Facility-Administered Medications:  .  0.9 %  sodium chloride infusion, 250 mL, Intravenous, PRN, Pyreddy, Pavan, MD .  acetaminophen (TYLENOL) tablet 650 mg, 650 mg, Oral, Q4H PRN, Leslye Peer, Richard, MD .  Margrett Rud amiodarone (NEXTERONE) 1.8 mg/mL load via infusion 150 mg, 150 mg, Intravenous, Once, 150 mg at 12/21/16 1249 **FOLLOWED BY** [EXPIRED] amiodarone (NEXTERONE PREMIX) 360-4.14 MG/200ML-% (1.8 mg/mL) IV infusion, 60 mg/hr, Intravenous, Continuous, Stopped at 12/21/16 1834 **FOLLOWED BY** amiodarone (NEXTERONE PREMIX) 360-4.14 MG/200ML-% (1.8 mg/mL) IV infusion, 30 mg/hr, Intravenous, Continuous, Tukov, Magadalene S, NP, Last Rate: 16.7 mL/hr at 12/22/16 1220, 30 mg/hr at 12/22/16 1220 .  aspirin EC tablet 81 mg, 81 mg, Oral, Daily, Wieting, Richard, MD, 81 mg at 12/22/16 1020 .  atorvastatin (LIPITOR) tablet 40 mg, 40 mg, Oral, Daily, Pyreddy, Pavan, MD, 40 mg at 12/22/16 1020 .  budesonide (PULMICORT) nebulizer solution 0.5 mg, 0.5 mg, Nebulization, BID, Leslye Peer, Richard, MD, 0.5 mg at 12/22/16 0810 .  feeding supplement (ENSURE ENLIVE) (ENSURE ENLIVE) liquid 237 mL, 237 mL, Oral, BID BM, Leslye Peer, Richard, MD, 237 mL at 12/22/16 1352 .  furosemide (LASIX) tablet 40 mg, 40 mg, Oral, Daily, Tukov, Magadalene S, NP, 40 mg at 12/22/16 1020 .  ipratropium-albuterol (DUONEB) 0.5-2.5 (3) MG/3ML nebulizer solution 3 mL, 3 mL, Nebulization, Q4H PRN, Wilhelmina Mcardle, MD .  levothyroxine (SYNTHROID, LEVOTHROID) tablet 50 mcg, 50 mcg, Oral, QAC breakfast, Pyreddy, Pavan, MD, 50 mcg at 12/22/16 0759 .  metoprolol tartrate (LOPRESSOR) tablet 25 mg, 25 mg, Oral, BID, Clabe Seal, PA-C, 25 mg at 12/22/16 1021 .  morphine 2 MG/ML injection 2 mg, 2 mg, Intravenous, Q4H PRN, Pyreddy, Pavan, MD, 1 mg at 12/21/16 2227 .  multivitamin with minerals tablet 1 tablet, 1 tablet,  Oral, Daily, Leslye Peer, Richard, MD, 1 tablet at 12/22/16 1020 .  nitroGLYCERIN (NITROSTAT) SL tablet 0.4 mg, 0.4 mg, Sublingual, Q5 Min x 3 PRN, Pyreddy, Pavan, MD .  ondansetron (ZOFRAN) injection 4 mg, 4 mg, Intravenous, Q6H PRN, Pyreddy, Pavan, MD, 4 mg at 12/22/16 1213 .  sodium chloride 0.9 % bolus 250 mL, 250 mL, Intravenous, Once, Awilda Bill, NP, Last Rate: 250 mL/hr at 12/22/16 1611, 250 mL at 12/22/16 1611 .  sodium chloride flush (NS) 0.9 % injection 3 mL, 3 mL, Intravenous, Q12H, Pyreddy, Pavan, MD, 3 mL at 12/21/16 2231 .  sodium chloride flush (NS) 0.9 % injection 3 mL, 3 mL, Intravenous, PRN, Pyreddy, Pavan, MD .  sucralfate (CARAFATE) 1 GM/10ML suspension 1 g, 1 g, Oral, TID WC & HS, Leslye Peer, Richard, MD, 1 g at 12/22/16 1213   Physical exam:  Vitals:   12/22/16 1300 12/22/16 1400 12/22/16 1500 12/22/16 1600  BP: (!) 89/52 (!) 86/53 (!) 79/52 (!) 84/53  Pulse: 91 90 88 86  Resp: (!) 36 (!) 30 (!) 35 (!) 23  Temp: 97.9 F (36.6 C)     TempSrc: Oral  SpO2: 96% 96% 96% 96%  Weight:      Height:           CMP Latest Ref Rng & Units 12/22/2016  Glucose 65 - 99 mg/dL 168(H)  BUN 6 - 20 mg/dL 70(H)  Creatinine 0.44 - 1.00 mg/dL 2.00(H)  Sodium 135 - 145 mmol/L 130(L)  Potassium 3.5 - 5.1 mmol/L 4.2  Chloride 101 - 111 mmol/L 95(L)  CO2 22 - 32 mmol/L 20(L)  Calcium 8.9 - 10.3 mg/dL 7.9(L)  Total Protein 6.5 - 8.1 g/dL -  Total Bilirubin 0.3 - 1.2 mg/dL -  Alkaline Phos 38 - 126 U/L -  AST 15 - 41 U/L -  ALT 14 - 54 U/L -   CBC Latest Ref Rng & Units 12/22/2016  WBC 3.6 - 11.0 K/uL 3.1(L)  Hemoglobin 12.0 - 16.0 g/dL 10.1(L)  Hematocrit 35.0 - 47.0 % 31.3(L)  Platelets 150 - 440 K/uL 102(L)     Dg Chest 2 View  Result Date: 12/19/2016 CLINICAL DATA:  Central chest pain and shortness of breath yesterday. History of ovarian cancer, breast cancer, on chemotherapy. EXAM: CHEST  2 VIEW COMPARISON:  CT chest November 20, 2016 FINDINGS: The cardiac  silhouette is mildly enlarged and unchanged. Calcified aortic knob. Small pleural effusions. No focal consolidation. RIGHT single-lumen chest Port-A-Cath distal tip projects in mid superior vena cava. No pneumothorax. Soft tissue planes and included osseous structures are nonsuspicious. Calcifications in the neck are likely vascular. Osteopenia. IMPRESSION: Mild cardiomegaly.  Small pleural effusions. Aortic Atherosclerosis (ICD10-I70.0). Electronically Signed   By: Elon Alas M.D.   On: 12/19/2016 02:00   Ct Angio Chest Pe W/cm &/or Wo Cm  Result Date: 12/19/2016 CLINICAL DATA:  Acute onset of central chest pain and shortness of breath. Current history of ovarian cancer, status post recent chemotherapy. EXAM: CT ANGIOGRAPHY CHEST WITH CONTRAST TECHNIQUE: Multidetector CT imaging of the chest was performed using the standard protocol during bolus administration of intravenous contrast. Multiplanar CT image reconstructions and MIPs were obtained to evaluate the vascular anatomy. CONTRAST:  70mL ISOVUE-370 IOPAMIDOL (ISOVUE-370) INJECTION 76% COMPARISON:  Chest radiograph performed earlier today at 1:41 a.m., and CTA of the chest performed 11/20/2016 FINDINGS: Cardiovascular:  There is no evidence of pulmonary embolus. The heart is borderline normal in size. Diffuse coronary artery calcifications are seen. Calcification is noted at the aortic valve. Scattered calcification is seen along the aortic arch and descending thoracic aorta, and at the great vessels. Borderline prominence of the ascending thoracic aorta is thought to be within normal limits given the patient's age. Mediastinum/Nodes: An enlarged subcarinal node is suggested, measuring 2.0 cm in short axis. Previously noted esophageal wall thickening has largely resolved. A small residual hiatal hernia is seen. No pericardial effusion is identified. The thyroid gland is grossly unremarkable. No axillary lymphadenopathy is seen. A right-sided chest  port is noted. Lungs/Pleura: Small bilateral pleural effusions are noted, with associated bibasilar atelectasis. No pneumothorax is seen. No definite mass is identified. Upper Abdomen: The visualized portions of the liver and the spleen are unremarkable. Small stones are noted dependently within the gallbladder. The visualized portions of the adrenal glands and kidneys are grossly unremarkable. There is a 3.4 cm cystic mass at the tail of the pancreas, as noted on prior CT. Musculoskeletal: No acute osseous abnormalities are identified. Multilevel endplate sclerotic change is noted along the thoracic and upper lumbar spine, with vacuum phenomenon. The visualized musculature is unremarkable in appearance. Review of the MIP images confirms  the above findings. IMPRESSION: 1. No evidence of pulmonary embolus. 2. Small bilateral pleural effusions, with bibasilar atelectasis. 3. Diffuse coronary artery calcifications. 4. Enlarged subcarinal node, measuring 2.0 cm in short axis, may reflect the patient's malignancy or may be chronic in nature, though mildly more prominent than on the recent prior CTA. 5. Small residual hiatal hernia noted. 6. Cholelithiasis.  Gallbladder otherwise unremarkable. 7. 3.4 cm cystic mass again noted at the tail of the pancreas; this is relatively stable in appearance. Electronically Signed   By: Garald Balding M.D.   On: 12/19/2016 04:20   Dg Chest Port 1 View  Result Date: 12/22/2016 CLINICAL DATA:  Respiratory failure EXAM: PORTABLE CHEST 1 VIEW COMPARISON:  December 20, 2016 FINDINGS: Port-A-Cath tip is in the superior vena cava. No pneumothorax. There are small pleural effusions bilaterally. There is no edema or consolidation. There is cardiomegaly with pulmonary venous hypertension. No adenopathy. There is aortic atherosclerosis. There is evidence of old trauma involving the lateral left clavicle, stable. IMPRESSION: Pulmonary vascular congestion with small pleural effusions  bilaterally. No edema or consolidation evident. There is aortic atherosclerosis. No pneumothorax. Aortic Atherosclerosis (ICD10-I70.0). Electronically Signed   By: Lowella Grip III M.D.   On: 12/22/2016 07:07   Dg Chest Port 1 View  Result Date: 12/20/2016 CLINICAL DATA:  83 year old female undergoing chemotherapy for ovarian cancer. Treatment last week with subsequent shortness of breath. EXAM: PORTABLE CHEST 1 VIEW COMPARISON:  Chest CTA 12/19/2016 and earlier. FINDINGS: Portable AP upright view at 1424 hours. Stable right IJ approach chest porta cath, currently accessed. Increased radiographic opacity at both lung bases in part compatible with the pleural effusions demonstrated by CTA yesterday. Left greater than right lower lobe opacity increase. Increased patchy right perihilar opacity which partially overlaps the right Port-A-Cath. No pneumothorax. Stable cardiac size and mediastinal contours. No pneumothorax. Stable pulmonary vascularity. IMPRESSION: Increased bibasilar and right suprahilar pulmonary opacity since the CTA yesterday appears to reflect a combination of airspace disease and small pleural effusions. Consider acute multifocal infection versus aspiration. Electronically Signed   By: Genevie Ann M.D.   On: 12/20/2016 14:33    Assessment and plan- Patient is a 83 y.o. female with remote history of breast cancer, recently diagnosed with stage III ovarian cancer being treated with curative intent status,  post 2 cycles of carboplatin + Taxol+ the Avastin, planned debulking surgery after 4 cycles of chemotherapy treatment, presented with shortness of breath and chest discomfort.  # NSTEMI/Afib/cardiogenic hypotension: Deferred management per ICU and cardiology. On amiodarone.    # Coffee-ground emesis: Continue IV PPI. continue low dose Aspirin.   # Stage III ovarian cancer currently on chemotherapy treatment with curative intent:  Clinically good response. Continue inpatient care for  NSTEMI/A-Fib/cardiogenic hypotension.   If she fully recovers, she can follow up outpatient for reevaluation if she is a candidate for additional chemotherapy.  # Anemia/Thrombocytopenia: likely chemotherapy induced. No active bleeding. Continue monitor.    # CODE STATUS:DNR/DNI.    Will continue follow along her inpatient course.  Total face to face encounter time for this patient visit was 25 min. >50% of the time was  spent in counseling and coordination of care.     Dr. Earlie Server, MD, PhD [facility] at [facility] Pager- [phone] 12/22/2016

## 2016-12-22 NOTE — Progress Notes (Signed)
Patient's active problem list includes:  Afib RVR - now in Sinus Tach, Acute Hypoxic Respiratory Failure, Aspiration Pna, Acute Upper GI Bleed, NSTEMI, Ovarian Cancer with mets to the peritoneum.  Patient has hx of Hypothyroidism, HTN, HLD, and Breast Cancer.  Rounded on patient per CCU RN's request for educational materials.  This RN spoke to patient, son, and daughter.  Patient, son, and daughter are not ready for education at this time.  Main focus for this patient at this time is her respiratory status.  Patient requiring oxygen via high flow nasal cannula.  Patient on IV diuresis, ASA, Atorvastatin, Metoprolol, and Losartan.  Note:  EF on Echo performed on December 20, 2016 revealed Acute Systolic CHF with EF of 25 - 30% and moderate to severe aortic stenosis.    Will defer NSTEMI / CHF education at this time.  Will await improvement in respiratory status.    Roanna Epley, RN, BSN, [facility] Cardiovascular and Pulmonary Nurse Navigator

## 2016-12-22 NOTE — Progress Notes (Signed)
Spoke with [place], NP regarding patient's blood pressure. NP is okay with a Map of 60 for patient.

## 2016-12-23 ENCOUNTER — Inpatient Hospital Stay: Payer: Medicare Other

## 2016-12-23 DIAGNOSIS — J811 Chronic pulmonary edema: Secondary | ICD-10-CM

## 2016-12-23 LAB — URINALYSIS, COMPLETE (UACMP) WITH MICROSCOPIC
Bacteria, UA: NONE SEEN
Bilirubin Urine: NEGATIVE
GLUCOSE, UA: NEGATIVE mg/dL
HGB URINE DIPSTICK: NEGATIVE
KETONES UR: NEGATIVE mg/dL
NITRITE: NEGATIVE
PROTEIN: 30 mg/dL — AB
Specific Gravity, Urine: 1.016 (ref 1.005–1.030)
pH: 5 (ref 5.0–8.0)

## 2016-12-23 LAB — PROCALCITONIN: Procalcitonin: 0.9 ng/mL

## 2016-12-23 MED ORDER — AMIODARONE HCL 200 MG PO TABS
200.0000 mg | ORAL_TABLET | Freq: Two times a day (BID) | ORAL | Status: DC
  Administered 2016-12-23 – 2017-01-01 (×19): 200 mg via ORAL
  Filled 2016-12-23 (×20): qty 1

## 2016-12-23 MED ORDER — SODIUM CHLORIDE 0.9 % IV BOLUS (SEPSIS)
500.0000 mL | Freq: Once | INTRAVENOUS | Status: AC
  Administered 2016-12-23: 500 mL via INTRAVENOUS

## 2016-12-23 MED ORDER — PANTOPRAZOLE SODIUM 40 MG PO TBEC
40.0000 mg | DELAYED_RELEASE_TABLET | Freq: Every day | ORAL | Status: DC
  Administered 2016-12-23 – 2017-01-01 (×10): 40 mg via ORAL
  Filled 2016-12-23 (×10): qty 1

## 2016-12-23 NOTE — Progress Notes (Signed)
RN made Ebony Dyer, NP aware that patient has not voided since in and out cath at 1230 and does not have urge to void.  Bladder Scan result 504.  NP gave order to insert foley for urinary retention.

## 2016-12-23 NOTE — Progress Notes (Signed)
Pt states she is slightly short of breath no other complaints at this time.  Vitals:   12/23/16 0700 12/23/16 0701 12/23/16 0800 12/23/16 0900  BP: (!) 98/56  97/62 (!) 89/58  Pulse: 79  80   Resp: (!) 30  19 19   Temp:  97.8 F (36.6 C)    TempSrc:  Oral    SpO2: 94%  97%   Weight:      Height:         Gen: WDWN in NAD HEENT: NCAT, sclerae white, oropharynx normal Neck: No LAN, no JVD noted Lungs: full BS, even, non labored  Cardiovascular: irregular irregular, rate controlled, no M/R/G Abdomen: Soft, NT, +BS x4 Ext: normal bulk and tone, no edema  Neuro: PERRL, EOMI, motor/sensory grossly intact Skin: No lesions noted  BMP Latest Ref Rng & Units 12/22/2016 12/21/2016 12/20/2016  Glucose 65 - 99 mg/dL 168(H) 284(H) 155(H)  BUN 6 - 20 mg/dL 70(H) 56(H) 55(H)  Creatinine 0.44 - 1.00 mg/dL 2.00(H) 1.26(H) 1.18(H)  Sodium 135 - 145 mmol/L 130(L) 132(L) 132(L)  Potassium 3.5 - 5.1 mmol/L 4.2 3.7 4.8  Chloride 101 - 111 mmol/L 95(L) 100(L) 102  CO2 22 - 32 mmol/L 20(L) 22 20(L)  Calcium 8.9 - 10.3 mg/dL 7.9(L) 7.7(L) 7.9(L)    CBC Latest Ref Rng & Units 12/22/2016 12/21/2016 12/20/2016  WBC 3.6 - 11.0 K/uL 3.1(L) 5.1 8.6  Hemoglobin 12.0 - 16.0 g/dL 10.1(L) 10.8(L) 11.3(L)  Hematocrit 35.0 - 47.0 % 31.3(L) 33.0(L) 34.4(L)  Platelets 150 - 440 K/uL 102(L) 131(L) 176    CXR: Perhaps mild interstitial edema, possible very small right pleural effusion  IMPRESSION: NSTEMI AFRVR > NSR Pulmonary edema - clinically improving Hypotension  Acute hypoxemic respiratory failure Doubt PNA or other active infection  PLAN/REC: Continue supplemental O2 to maintain SpO2 90-96% Repeat CXR today Continue prn bronchodilator therapy   Per cardiology transitioning to po amiodarone 11/15-no anticoagulation at this time due to anemia and GI bleed Prn neo-synephrine gtt to maintain map 60 or higher  Marda Stalker, [place] Pager [phone] (please enter 7  digits) PCCM Consult Pager [phone] (please enter 7 [phone])   PCCM ATTENDING ATTESTATION:  I have evaluated patient with the APP [place], reviewed database in its entirety and discussed care plan in detail. In addition, this patient was discussed on multidisciplinary rounds.   I agree with the above findings, assessment and plan  Merton Border, MD PCCM service Mobile [phone] Pager [phone] 12/23/2016 4:58 PM

## 2016-12-23 NOTE — Progress Notes (Signed)
RN made Ebony Dyer, NP aware that patient has not voided today and that bladder scan result is 779 cc.  NP gave order to in and out cath patient.

## 2016-12-23 NOTE — Progress Notes (Signed)
Verified with NP that pt. is floor care.

## 2016-12-23 NOTE — Progress Notes (Signed)
[facility]  SUBJECTIVE: The patient denies chest pain, shortness of breath at rest, or palpitations.    Vitals:   12/23/16 0600 12/23/16 0700 12/23/16 0701 12/23/16 0800  BP: (!) 78/57 (!) 98/56  97/62  Pulse: 75 79  80  Resp: 18 (!) 30  19  Temp:   97.8 F (36.6 C)   TempSrc:   Oral   SpO2: 95% 94%  97%  Weight:      Height:         Intake/Output Summary (Last 24 hours) at 12/23/2016 0841 Last data filed at 12/23/2016 0700 Gross per 24 hour  Intake 1523.85 ml  Output 350 ml  Net 1173.85 ml      PHYSICAL EXAM  General: Chronically-ill appearing, in no acute distress HEENT:  Normocephalic and atramatic Neck:  Supple Lungs: Normal effort of breathing on supplemental oxygen. Heart: HRRR .  Abdomen: Bowel sounds are positive Msk:  Gait not assessed. Tone appears normal for age Extremities: No clubbing, cyanosis or edema.   Neuro: Alert and oriented X 3. Psych:  Good affect, responds appropriately   LABS: Basic Metabolic Panel: Recent Labs    12/21/16 0514 12/21/16 1307 12/22/16 0524  NA 132*  --  130*  K 3.7  --  4.2  CL 100*  --  95*  CO2 22  --  20*  GLUCOSE 284*  --  168*  BUN 56*  --  70*  CREATININE 1.26*  --  2.00*  CALCIUM 7.7*  --  7.9*  MG  --  2.2  --   PHOS  --  3.0  --    Liver Function Tests: No results for input(s): AST, ALT, ALKPHOS, BILITOT, PROT, ALBUMIN in the last 72 hours. No results for input(s): LIPASE, AMYLASE in the last 72 hours. CBC: Recent Labs    12/21/16 0514 12/22/16 0524  WBC 5.1 3.1*  HGB 10.8* 10.1*  HCT 33.0* 31.3*  MCV 93.4 93.3  PLT 131* 102*   Cardiac Enzymes: Recent Labs    12/22/16 1721  TROPONINI 4.18*   BNP: Invalid input(s): POCBNP D-Dimer: No results for input(s): DDIMER in the last 72 hours. Hemoglobin A1C: No results for input(s): HGBA1C in the last 72 hours. Fasting Lipid Panel: No results for input(s): CHOL, HDL, LDLCALC, TRIG, CHOLHDL, LDLDIRECT in the last 72 hours. Thyroid  Function Tests: No results for input(s): TSH, T4TOTAL, T3FREE, THYROIDAB in the last 72 hours.  Invalid input(s): FREET3 Anemia Panel: No results for input(s): VITAMINB12, FOLATE, FERRITIN, TIBC, IRON, RETICCTPCT in the last 72 hours.  Dg Chest Port 1 View  Result Date: 12/22/2016 CLINICAL DATA:  Respiratory failure EXAM: PORTABLE CHEST 1 VIEW COMPARISON:  December 20, 2016 FINDINGS: Port-A-Cath tip is in the superior vena cava. No pneumothorax. There are small pleural effusions bilaterally. There is no edema or consolidation. There is cardiomegaly with pulmonary venous hypertension. No adenopathy. There is aortic atherosclerosis. There is evidence of old trauma involving the lateral left clavicle, stable. IMPRESSION: Pulmonary vascular congestion with small pleural effusions bilaterally. No edema or consolidation evident. There is aortic atherosclerosis. No pneumothorax. Aortic Atherosclerosis (ICD10-I70.0). Electronically Signed   By: Lowella Grip III M.D.   On: 12/22/2016 07:07     Echo EF 25-30%, moderate to severe AS  TELEMETRY: Sinus arrhthymia with PACs, rate 81 bpm  ASSESSMENT AND PLAN:  Active Problems:   NSTEMI (non-ST elevated myocardial infarction) ([place])   Acute respiratory failure with hypoxemia (HCC)   Shortness of breath  Thrombocytopenia ([place])    1. New-onset atrial fibrillation with RVR, in the setting of acute systolic congestive heart failure, acute hypoxic respiratory failure, NSTEMI, upper GI bleed, and metastatic ovarian cancer. Patient denies palpitations. Rate and rhythm controlled with amiodarone drip. 2. NSTEMI, peak troponin 10.05, recent echo reveals severely reduced LV function of 25-30% as compared to echo in 11/2016, which was normal. The patient has not had chest pain. Not a candidate for cardiac catheterization with concurrent GI bleed while on heparin, and hypoxic respiratory failure. 3. Aortic stenosis, moderate to severe 4. Acute systolic CHF,  EF 81-10% 5. Upper GI bleed while on heparin, which was discontinued 6. 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 Agree with overall therapy. 2. Start PO amiodarone 200 mg BID when drip complete. 3. Defer chronic anticoagulation at this time in light of patient's anemia and GI bleed 4. Not a candidate for cardioversion or cardiac catheterization at this time.     Clabe Seal, PA-C 12/23/2016 8:41 AM

## 2016-12-23 NOTE — Progress Notes (Signed)
Patient ID: Ebony Navarro, female   DOB: December 31, 1933, 83 y.o.   MRN: [id number]  [name]  [name]:[id number] DOB: 1933/07/04 DOA: 12/19/2016 PCP: Tama High III, MD  HPI/Subjective: has shortness of breath and cough. On BIPAP since this am. Off amiodarone drip. but on neo drip due to hypotension. Objective: Vitals:   12/23/16 1430 12/23/16 1545  BP: (!) 93/57 (!) 96/53  Pulse: 87 (!) 56  Resp: (!) 23 (!) 24  Temp:    SpO2: 96% 94%    Filed Weights   12/19/16 0130 12/19/16 0522  Weight: 160 lb (72.6 kg) 159 lb 11.2 oz (72.4 kg)    ROS: Review of Systems  Constitutional: Negative for chills and fever.  HENT: Positive for hearing loss.   Eyes: Negative for blurred vision.  Respiratory: Positive for cough and shortness of breath.   Cardiovascular: Negative for chest pain.  Gastrointestinal: Negative for abdominal pain, constipation, diarrhea, nausea and vomiting.  Genitourinary: Negative for dysuria.  Musculoskeletal: Negative for joint pain.  Neurological: Negative for dizziness and headaches.   Exam: Physical Exam  Constitutional: She is oriented to person, place, and time. She appears well-developed.  HENT:  Head: Normocephalic.  Nose: No mucosal edema.  Mouth/Throat: No oropharyngeal exudate or posterior oropharyngeal edema.  Eyes: Conjunctivae, EOM and lids are normal. Pupils are equal, round, and reactive to light.  Neck: Neck supple. No JVD present. Carotid bruit is not present. No edema present. No thyroid mass and no thyromegaly present.  Cardiovascular: Normal rate, S1 normal and S2 normal. Exam reveals no gallop.  Murmur heard. Pulses:      Dorsalis pedis pulses are 2+ on the right side, and 2+ on the left side.  Respiratory: No respiratory distress. She has decreased breath sounds in the right middle field, the right lower field, the left middle field and the left lower field. She has wheezes in the left middle  field. She has rhonchi in the right lower field and the left lower field. She has no rales.  GI: Soft. Bowel sounds are normal. She exhibits no distension. There is no tenderness.  Musculoskeletal:       Right ankle: She exhibits no swelling.       Left ankle: She exhibits no swelling.  Lymphadenopathy:    She has no cervical adenopathy.  Neurological: She is alert and oriented to person, place, and time. No cranial nerve deficit.  Skin: Skin is warm. No rash noted. Nails show no clubbing.  Psychiatric: She has a normal mood and affect.      Data Reviewed: Basic Metabolic Panel: Recent Labs  Lab 12/19/16 0212 12/19/16 0525 12/20/16 0540 12/21/16 0514 12/21/16 1307 12/22/16 0524  NA 136 133* 132* 132*  --  130*  K 4.0 4.7 4.8 3.7  --  4.2  CL 106 104 102 100*  --  95*  CO2 20* 20* 20* 22  --  20*  GLUCOSE 144* 161* 155* 284*  --  168*  BUN 40* 43* 55* 56*  --  70*  CREATININE 0.92 1.03* 1.18* 1.26*  --  2.00*  CALCIUM 7.4* 8.4* 7.9* 7.7*  --  7.9*  MG  --   --   --   --  2.2  --   PHOS  --   --   --   --  3.0  --    Liver Function Tests: Recent Labs  Lab 12/17/16 0840  AST 27  ALT 14  ALKPHOS 72  BILITOT 0.9  PROT 7.7  ALBUMIN 3.6   CBC: Recent Labs  Lab 12/17/16 0840 12/19/16 0212 12/19/16 0525 12/19/16 1109 12/20/16 0540 12/21/16 0514 12/22/16 0524  WBC 5.2 11.7* 11.8*  --  8.6 5.1 3.1*  NEUTROABS 3.4  --   --   --   --   --   --   HGB 12.1 11.1* 12.1 12.1 11.3* 10.8* 10.1*  HCT 35.5 34.2* 37.3  --  34.4* 33.0* 31.3*  MCV 91.4 93.3 93.1  --  92.6 93.4 93.3  PLT 234 219 228  --  176 131* 102*   Cardiac Enzymes: Recent Labs  Lab 12/19/16 0212 12/19/16 0525 12/19/16 1109 12/19/16 1718 12/22/16 1721  TROPONINI 3.16* 6.72* 10.05* 13.33* 4.18*     Studies: Dg Chest Port 1 View  Result Date: 12/23/2016 CLINICAL DATA:  Respiratory failure. EXAM: PORTABLE CHEST 1 VIEW COMPARISON:  12/22/2016 . FINDINGS: PowerPort catheter noted with tip over the  superior vena cava . Cardiomegaly with bilateral from interstitial prominence and bilateral pleural effusions consistent with CHF. Bilateral pneumonitis cannot be excluded. No pneumothorax. IMPRESSION: 1. PowerPort catheter noted with tip noted over the superior vena cava . 2. Cardiomegaly with mild pulmonary interstitial prominence and bilateral pleural effusions consistent CHF. Bilateral pneumonitis cannot be excluded. Electronically Signed   By: Marcello Moores  Register   On: 12/23/2016 09:53   Dg Chest Port 1 View  Result Date: 12/22/2016 CLINICAL DATA:  Respiratory failure EXAM: PORTABLE CHEST 1 VIEW COMPARISON:  December 20, 2016 FINDINGS: Port-A-Cath tip is in the superior vena cava. No pneumothorax. There are small pleural effusions bilaterally. There is no edema or consolidation. There is cardiomegaly with pulmonary venous hypertension. No adenopathy. There is aortic atherosclerosis. There is evidence of old trauma involving the lateral left clavicle, stable. IMPRESSION: Pulmonary vascular congestion with small pleural effusions bilaterally. No edema or consolidation evident. There is aortic atherosclerosis. No pneumothorax. Aortic Atherosclerosis (ICD10-I70.0). Electronically Signed   By: Lowella Grip III M.D.   On: 12/22/2016 07:07    Scheduled Meds: . amiodarone  200 mg Oral BID  . aspirin EC  81 mg Oral Daily  . atorvastatin  40 mg Oral Daily  . budesonide (PULMICORT) nebulizer solution  0.5 mg Nebulization BID  . feeding supplement (ENSURE ENLIVE)  237 mL Oral BID BM  . furosemide  40 mg Oral Daily  . levothyroxine  50 mcg Oral QAC breakfast  . multivitamin with minerals  1 tablet Oral Daily  . pantoprazole  40 mg Oral Q1200  . sodium chloride flush  3 mL Intravenous Q12H   Continuous Infusions: . sodium chloride    . phenylephrine (NEO-SYNEPHRINE) Adult infusion Stopped (12/23/16 1409)    Assessment/Plan:  1. Atrial fibrillation with rapid ventricular response.  Continue  low-dose metoprolol twice daily.  amiodarone drip was changed to amiodarone p.o.  2. Acute hypoxic respiratory failure.  On BIPAP. NEB. 3. Aspiration pneumonia on Zosyn 4. Acute systolic congestive heart failure with severe aortic stenosisLV EF: 25% -   30%.  Lasix and metoprolol if BP allows. 5. Upper GI bleed with hematemesis.  Hold Decadron.  Stopped heparin drip.   Changed Protonix drip to twice daily dosing.  As per GI okay to start enteric-coated aspirin and Carafate. 6. NSTEMI.  Unable to do heparin drip secondary to GI bleed.  restarted enteric-coated aspirin.  Patient on low-dose metoprolol.  Continue atorvastatin. 7. 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  Ovarian cancer primary.  Patient received  her second chemotherapy on Friday.   Per Dr. Tasia Catchings,  If she fully recovers, she can follow up outpatient for reevaluation if she is a candidate for additional chemotherapy.  8. Hyperlipidemia unspecified on atorvastatin 9. Hypothyroidism unspecified. On levothyroxine. 10.  Hypotension, possible due to cardiogenic etiology.  Hold Lasix and Lopressor if BP is low. Try to wean off neo drip. 11. Acute renal failure.  Follow-up BMP. ICU nurse practitioner Hinton Dyer. Code Status:     Code Status Orders  (From admission, onward)        Start     Ordered   12/19/16 0526  Full code  Continuous     12/19/16 0526    Code Status History    Date Active Date Inactive Code Status Order ID Comments User Context   11/20/2016 16:39 11/22/2016 19:50 Full Code [id number]  Idelle Crouch, MD ED    Advance Directive Documentation     Most Recent Value  Type of Advance Directive  Healthcare Power of Attorney  Pre-existing out of facility DNR order (yellow form or pink MOST form)  No data  "MOST" Form in Place?  No data     Family Communication: spoke with her son at the bedside Disposition Plan: to be determined.  Consultants:  Cardiology  Gastroenterology  Oncology  Critical care  specialist  Time spent: 36 minutes.  Demetrios Loll  [employer]

## 2016-12-24 ENCOUNTER — Other Ambulatory Visit: Payer: Self-pay

## 2016-12-24 LAB — CBC WITH DIFFERENTIAL/PLATELET
BASOS PCT: 0 %
Basophils Absolute: 0 10*3/uL (ref 0–0.1)
EOS PCT: 3 %
Eosinophils Absolute: 0 10*3/uL (ref 0–0.7)
HEMATOCRIT: 29.1 % — AB (ref 35.0–47.0)
Hemoglobin: 9.4 g/dL — ABNORMAL LOW (ref 12.0–16.0)
LYMPHS PCT: 38 %
Lymphs Abs: 0.3 10*3/uL — ABNORMAL LOW (ref 1.0–3.6)
MCH: 29.7 pg (ref 26.0–34.0)
MCHC: 32.3 g/dL (ref 32.0–36.0)
MCV: 92.1 fL (ref 80.0–100.0)
MONOS PCT: 8 %
Monocytes Absolute: 0.1 10*3/uL — ABNORMAL LOW (ref 0.2–0.9)
NEUTROS ABS: 0.4 10*3/uL — AB (ref 1.4–6.5)
Neutrophils Relative %: 51 %
Platelets: 55 10*3/uL — ABNORMAL LOW (ref 150–440)
RBC: 3.16 MIL/uL — ABNORMAL LOW (ref 3.80–5.20)
RDW: 16.2 % — AB (ref 11.5–14.5)
WBC: 0.8 10*3/uL — CL (ref 3.6–11.0)

## 2016-12-24 LAB — URINE CULTURE: Culture: NO GROWTH

## 2016-12-24 LAB — BASIC METABOLIC PANEL
Anion gap: 11 (ref 5–15)
BUN: 92 mg/dL — ABNORMAL HIGH (ref 6–20)
CALCIUM: 8.2 mg/dL — AB (ref 8.9–10.3)
CO2: 24 mmol/L (ref 22–32)
CREATININE: 2.09 mg/dL — AB (ref 0.44–1.00)
Chloride: 99 mmol/L — ABNORMAL LOW (ref 101–111)
GFR calc Af Amer: 24 mL/min — ABNORMAL LOW (ref >60)
GFR calc non Af Amer: 21 mL/min — ABNORMAL LOW (ref >60)
GLUCOSE: 133 mg/dL — AB (ref 65–99)
Potassium: 3.6 mmol/L (ref 3.5–5.1)
Sodium: 134 mmol/L — ABNORMAL LOW (ref 135–145)

## 2016-12-24 LAB — MAGNESIUM: Magnesium: 2.2 mg/dL (ref 1.7–2.4)

## 2016-12-24 MED ORDER — ALPRAZOLAM 0.25 MG PO TABS
0.2500 mg | ORAL_TABLET | Freq: Two times a day (BID) | ORAL | Status: DC | PRN
Start: 2016-12-24 — End: 2017-01-01
  Administered 2016-12-24 – 2017-01-01 (×11): 0.25 mg via ORAL
  Filled 2016-12-24 (×11): qty 1

## 2016-12-24 MED ORDER — DIPHENHYDRAMINE HCL 25 MG PO CAPS
25.0000 mg | ORAL_CAPSULE | Freq: Once | ORAL | Status: AC
  Administered 2016-12-24: 25 mg via ORAL
  Filled 2016-12-24: qty 1

## 2016-12-24 MED ORDER — TBO-FILGRASTIM 300 MCG/0.5ML ~~LOC~~ SOSY
300.0000 ug | PREFILLED_SYRINGE | Freq: Every day | SUBCUTANEOUS | Status: AC
  Administered 2016-12-24 – 2016-12-26 (×3): 300 ug via SUBCUTANEOUS
  Filled 2016-12-24 (×4): qty 0.5

## 2016-12-24 NOTE — Progress Notes (Signed)
Patient transferred to room 257 via wheel chair.

## 2016-12-24 NOTE — Progress Notes (Signed)
Hematology/Oncology Progress Note [facility] [phone] [phone]  Patient Care Team: Adin Hector, MD as PCP - General (Internal Medicine) Clent Jacks, RN as Registered Nurse   Name of the patient: Ebony Navarro  [id number]  06/23/1933  Date of visit: 12/24/16   INTERVAL HISTORY- Patient was transferred out of ICU. No new complaints.   Review of systems- Review of Systems  Constitutional: Positive for malaise/fatigue. Negative for chills and fever.  HENT: Negative for hearing loss.   Eyes: Negative for blurred vision.  Respiratory: Positive for shortness of breath. Negative for cough.   Cardiovascular: Negative for chest pain, palpitations and leg swelling.  Gastrointestinal: Positive for nausea. Negative for heartburn.  Genitourinary: Negative for dysuria.  Musculoskeletal: Negative for myalgias.  Skin: Negative for rash.  Neurological: Positive for weakness. Negative for dizziness.  Endo/Heme/Allergies: Does not bruise/bleed easily.  Psychiatric/Behavioral: Negative for depression.    Allergies  Allergen Reactions  . Ace Inhibitors Other (See Comments)    hyperkalemia  . Atenolol Other (See Comments)    Worsening palpitations  . Iodine Other (See Comments)    11/20/16: per conversation with pt, pt with allergy to topical iodine and betadine.  Pt states she has had IV contrast in the past with now issues.  . Metoprolol Tartrate Other (See Comments)    intolerant  . Omeprazole Diarrhea  . Povidone-Iodine Other (See Comments)  . Rofecoxib Nausea Only  . Ciprofloxacin Rash  . Doxycycline Calcium Rash  . Nitrofurantoin Rash  . Quinolones Rash  . Sulfa Antibiotics Rash    Patient Active Problem List   Diagnosis Date Noted  . Thrombocytopenia ([place])   . Acute respiratory failure with hypoxemia ([place])   . Shortness of breath   . NSTEMI (non-ST elevated myocardial infarction) ([place]) 12/19/2016  . Ovarian  cancer ([place]) 11/24/2016  . Malignant neoplasm of ovary ([place]) 11/24/2016  . Abdominal pain 11/20/2016  . Ascites 11/20/2016  . Ovarian mass 11/20/2016  . Absolute anemia 01/22/2015  . Anxiety 01/22/2015  . Malignant neoplasm of breast ([place]) 01/22/2015  . Clinical depression 01/22/2015  . Gastric catarrh 01/22/2015  . Blood glucose elevated 01/22/2015  . HLD (hyperlipidemia) 01/22/2015  . BP (high blood pressure) 01/22/2015  . Acquired atrophy of thyroid 04/02/2014  . Frequent UTI 11/23/2013  . Chronic kidney disease (CKD), stage III (moderate) ([place]) 10/02/2013     Past Medical History:  Diagnosis Date  . Breast cancer ([place]) 2005   Right, radiation and lumpectomy  . Breast cancer ([place]) 2002   Left, Chemo, radiation and lumpectomy  . Cancer ([place])   . Hyperlipidemia   . Hypertension   . Ovarian cancer ([place]) 11/24/2016     Past Surgical History:  Procedure Laterality Date  . BREAST BIOPSY Left 2010   negative stereotactic biopsy  . BREAST LUMPECTOMY Right 2005   positive  . BREAST LUMPECTOMY Left 2002   positive  . PORTA CATH INSERTION N/A 12/09/2016   Performed by Algernon Huxley, MD at [facility]    Social History   Socioeconomic History  . Marital status: Widowed    Spouse name: Not on file  . Number of children: Not on file  . Years of education: Not on file  . Highest education level: Not on file  Social Needs  . Financial resource strain: Not on file  . Food insecurity - worry: Not on file  . Food insecurity - inability: Not on file  .  Transportation needs - medical: Not on file  . Transportation needs - non-medical: Not on file  Occupational History  . Occupation: retired  Tobacco Use  . Smoking status: Never Smoker  . Smokeless tobacco: Never Used  Substance and Sexual Activity  . Alcohol use: No  . Drug use: No  . Sexual activity: No    Birth control/protection: Post-menopausal  Other Topics Concern  . Not on file  Social History  Narrative  . Not on file     Family History  Problem Relation Age of Onset  . Hypertension Mother   . Hypertension Father      Current Facility-Administered Medications:  .  0.9 %  sodium chloride infusion, 250 mL, Intravenous, PRN, Pyreddy, Pavan, MD .  acetaminophen (TYLENOL) tablet 650 mg, 650 mg, Oral, Q4H PRN, Loletha Grayer, MD, 650 mg at 12/22/16 2331 .  ALPRAZolam (XANAX) tablet 0.25 mg, 0.25 mg, Oral, BID PRN, Awilda Bill, NP, 0.25 mg at 12/24/16 1045 .  amiodarone (PACERONE) tablet 200 mg, 200 mg, Oral, BID, Clabe Seal, PA-C, 200 mg at 12/24/16 1056 .  aspirin EC tablet 81 mg, 81 mg, Oral, Daily, Leslye Peer, Richard, MD, 81 mg at 12/24/16 1054 .  atorvastatin (LIPITOR) tablet 40 mg, 40 mg, Oral, Daily, Pyreddy, Pavan, MD, 40 mg at 12/24/16 1056 .  budesonide (PULMICORT) nebulizer solution 0.5 mg, 0.5 mg, Nebulization, BID, Leslye Peer, Richard, MD, 0.5 mg at 12/24/16 0748 .  feeding supplement (ENSURE ENLIVE) (ENSURE ENLIVE) liquid 237 mL, 237 mL, Oral, BID BM, Wieting, Richard, MD, 237 mL at 12/24/16 1600 .  furosemide (LASIX) tablet 40 mg, 40 mg, Oral, Daily, Demetrios Loll, MD, 40 mg at 12/24/16 1055 .  ipratropium-albuterol (DUONEB) 0.5-2.5 (3) MG/3ML nebulizer solution 3 mL, 3 mL, Nebulization, Q4H PRN, Wilhelmina Mcardle, MD, 3 mL at 12/23/16 1935 .  levothyroxine (SYNTHROID, LEVOTHROID) tablet 50 mcg, 50 mcg, Oral, QAC breakfast, Pyreddy, Pavan, MD, 50 mcg at 12/24/16 1046 .  morphine 2 MG/ML injection 2 mg, 2 mg, Intravenous, Q4H PRN, Pyreddy, Pavan, MD, 1 mg at 12/21/16 2227 .  multivitamin with minerals tablet 1 tablet, 1 tablet, Oral, Daily, Wieting, Richard, MD, 1 tablet at 12/23/16 0927 .  nitroGLYCERIN (NITROSTAT) SL tablet 0.4 mg, 0.4 mg, Sublingual, Q5 Min x 3 PRN, Pyreddy, Pavan, MD .  ondansetron (ZOFRAN) injection 4 mg, 4 mg, Intravenous, Q6H PRN, Pyreddy, Pavan, MD, 4 mg at 12/22/16 1856 .  pantoprazole (PROTONIX) EC tablet 40 mg, 40 mg, Oral, Q1200, Wilhelmina Mcardle, MD, 40 mg at 12/24/16 1045 .  sodium chloride flush (NS) 0.9 % injection 3 mL, 3 mL, Intravenous, Q12H, Pyreddy, Pavan, MD, 3 mL at 12/24/16 1046 .  sodium chloride flush (NS) 0.9 % injection 3 mL, 3 mL, Intravenous, PRN, Pyreddy, Pavan, MD .  Tbo-Filgrastim (GRANIX) injection 300 mcg, 300 mcg, Subcutaneous, Daily, Earlie Server, MD, 300 mcg at 12/24/16 1554   Physical exam:  Vitals:   12/24/16 0900 12/24/16 1000 12/24/16 1045 12/24/16 1100  BP: 102/61   92/67  Pulse: 92 88  92  Resp: 15 (!) 23  (!) 31  Temp:   98.2 F (36.8 C)   TempSrc:   Axillary   SpO2: 97% 96%  95%  Weight:      Height:           CMP Latest Ref Rng & Units 12/24/2016  Glucose 65 - 99 mg/dL 133(H)  BUN 6 - 20 mg/dL 92(H)  Creatinine 0.44 - 1.00 mg/dL 2.09(H)  Sodium 135 - 145 mmol/L 134(L)  Potassium 3.5 - 5.1 mmol/L 3.6  Chloride 101 - 111 mmol/L 99(L)  CO2 22 - 32 mmol/L 24  Calcium 8.9 - 10.3 mg/dL 8.2(L)  Total Protein 6.5 - 8.1 g/dL -  Total Bilirubin 0.3 - 1.2 mg/dL -  Alkaline Phos 38 - 126 U/L -  AST 15 - 41 U/L -  ALT 14 - 54 U/L -   CBC Latest Ref Rng & Units 12/24/2016  WBC 3.6 - 11.0 K/uL 0.8(LL)  Hemoglobin 12.0 - 16.0 g/dL 9.4(L)  Hematocrit 35.0 - 47.0 % 29.1(L)  Platelets 150 - 440 K/uL 55(L)     Dg Chest 2 View  Result Date: 12/19/2016 CLINICAL DATA:  Central chest pain and shortness of breath yesterday. History of ovarian cancer, breast cancer, on chemotherapy. EXAM: CHEST  2 VIEW COMPARISON:  CT chest November 20, 2016 FINDINGS: The cardiac silhouette is mildly enlarged and unchanged. Calcified aortic knob. Small pleural effusions. No focal consolidation. RIGHT single-lumen chest Port-A-Cath distal tip projects in mid superior vena cava. No pneumothorax. Soft tissue planes and included osseous structures are nonsuspicious. Calcifications in the neck are likely vascular. Osteopenia. IMPRESSION: Mild cardiomegaly.  Small pleural effusions. Aortic Atherosclerosis (ICD10-I70.0).  Electronically Signed   By: Elon Alas M.D.   On: 12/19/2016 02:00   Ct Angio Chest Pe W/cm &/or Wo Cm  Result Date: 12/19/2016 CLINICAL DATA:  Acute onset of central chest pain and shortness of breath. Current history of ovarian cancer, status post recent chemotherapy. EXAM: CT ANGIOGRAPHY CHEST WITH CONTRAST TECHNIQUE: Multidetector CT imaging of the chest was performed using the standard protocol during bolus administration of intravenous contrast. Multiplanar CT image reconstructions and MIPs were obtained to evaluate the vascular anatomy. CONTRAST:  41mL ISOVUE-370 IOPAMIDOL (ISOVUE-370) INJECTION 76% COMPARISON:  Chest radiograph performed earlier today at 1:41 a.m., and CTA of the chest performed 11/20/2016 FINDINGS: Cardiovascular:  There is no evidence of pulmonary embolus. The heart is borderline normal in size. Diffuse coronary artery calcifications are seen. Calcification is noted at the aortic valve. Scattered calcification is seen along the aortic arch and descending thoracic aorta, and at the great vessels. Borderline prominence of the ascending thoracic aorta is thought to be within normal limits given the patient's age. Mediastinum/Nodes: An enlarged subcarinal node is suggested, measuring 2.0 cm in short axis. Previously noted esophageal wall thickening has largely resolved. A small residual hiatal hernia is seen. No pericardial effusion is identified. The thyroid gland is grossly unremarkable. No axillary lymphadenopathy is seen. A right-sided chest port is noted. Lungs/Pleura: Small bilateral pleural effusions are noted, with associated bibasilar atelectasis. No pneumothorax is seen. No definite mass is identified. Upper Abdomen: The visualized portions of the liver and the spleen are unremarkable. Small stones are noted dependently within the gallbladder. The visualized portions of the adrenal glands and kidneys are grossly unremarkable. There is a 3.4 cm cystic mass at the tail of  the pancreas, as noted on prior CT. Musculoskeletal: No acute osseous abnormalities are identified. Multilevel endplate sclerotic change is noted along the thoracic and upper lumbar spine, with vacuum phenomenon. The visualized musculature is unremarkable in appearance. Review of the MIP images confirms the above findings. IMPRESSION: 1. No evidence of pulmonary embolus. 2. Small bilateral pleural effusions, with bibasilar atelectasis. 3. Diffuse coronary artery calcifications. 4. Enlarged subcarinal node, measuring 2.0 cm in short axis, may reflect the patient's malignancy or may be chronic in nature, though mildly more prominent than on the recent  prior CTA. 5. Small residual hiatal hernia noted. 6. Cholelithiasis.  Gallbladder otherwise unremarkable. 7. 3.4 cm cystic mass again noted at the tail of the pancreas; this is relatively stable in appearance. Electronically Signed   By: Garald Balding M.D.   On: 12/19/2016 04:20   Dg Chest Port 1 View  Result Date: 12/23/2016 CLINICAL DATA:  Respiratory failure. EXAM: PORTABLE CHEST 1 VIEW COMPARISON:  12/22/2016 . FINDINGS: PowerPort catheter noted with tip over the superior vena cava . Cardiomegaly with bilateral from interstitial prominence and bilateral pleural effusions consistent with CHF. Bilateral pneumonitis cannot be excluded. No pneumothorax. IMPRESSION: 1. PowerPort catheter noted with tip noted over the superior vena cava . 2. Cardiomegaly with mild pulmonary interstitial prominence and bilateral pleural effusions consistent CHF. Bilateral pneumonitis cannot be excluded. Electronically Signed   By: Marcello Moores  Register   On: 12/23/2016 09:53   Dg Chest Port 1 View  Result Date: 12/22/2016 CLINICAL DATA:  Respiratory failure EXAM: PORTABLE CHEST 1 VIEW COMPARISON:  December 20, 2016 FINDINGS: Port-A-Cath tip is in the superior vena cava. No pneumothorax. There are small pleural effusions bilaterally. There is no edema or consolidation. There is  cardiomegaly with pulmonary venous hypertension. No adenopathy. There is aortic atherosclerosis. There is evidence of old trauma involving the lateral left clavicle, stable. IMPRESSION: Pulmonary vascular congestion with small pleural effusions bilaterally. No edema or consolidation evident. There is aortic atherosclerosis. No pneumothorax. Aortic Atherosclerosis (ICD10-I70.0). Electronically Signed   By: Lowella Grip III M.D.   On: 12/22/2016 07:07   Dg Chest Port 1 View  Result Date: 12/20/2016 CLINICAL DATA:  83 year old female undergoing chemotherapy for ovarian cancer. Treatment last week with subsequent shortness of breath. EXAM: PORTABLE CHEST 1 VIEW COMPARISON:  Chest CTA 12/19/2016 and earlier. FINDINGS: Portable AP upright view at 1424 hours. Stable right IJ approach chest porta cath, currently accessed. Increased radiographic opacity at both lung bases in part compatible with the pleural effusions demonstrated by CTA yesterday. Left greater than right lower lobe opacity increase. Increased patchy right perihilar opacity which partially overlaps the right Port-A-Cath. No pneumothorax. Stable cardiac size and mediastinal contours. No pneumothorax. Stable pulmonary vascularity. IMPRESSION: Increased bibasilar and right suprahilar pulmonary opacity since the CTA yesterday appears to reflect a combination of airspace disease and small pleural effusions. Consider acute multifocal infection versus aspiration. Electronically Signed   By: Genevie Ann M.D.   On: 12/20/2016 14:33    Assessment and plan-  Patient is a 83 y.o. female with remote history of breast cancer, recently diagnosed with stage III ovarian cancer being treated with curative intent status,  post 2 cycles of carboplatin + Taxol+ the Avastin, planned debulking surgery after 4 cycles of chemotherapy treatment, presented with shortness of breath and chest discomfort.  # Stage III ovarian cancer currently on chemotherapy treatment with  curative intent:  Clinically good response. If she fully recovers, she can follow up outpatient for reevaluation if she is a candidate for additional chemotherapy.  # Neutropenia: start Granix daily x 3.Hold if [place] >1 # Thrombocytopenia:  likely chemotherapy induced. No active bleeding. Continue monitor. Continue Aspirin. Hold pharmacological DVT prophylaxis if <50,000. Continue pneumatic compression.  # NSTEMI/Afib/cardiogenic hypotension: Deferred management per cardiology management.     # CODE STATUS:DNR/DNI.    Will continue follow along her inpatient course.  Total face to face encounter time for this patient visit was 25 min. >50% of the time was  spent in counseling and coordination of care.    Talbert Cage  Tasia Catchings, MD, PhD Hematology Oncology [facility] at [facility] Pager- [phone] 12/24/2016

## 2016-12-24 NOTE — Progress Notes (Signed)
WBC count of 0.8.  Pt placed on neutropenic precautions.  [place], NP notified.

## 2016-12-24 NOTE — Progress Notes (Signed)
Patient ID: Ebony Navarro, female   DOB: 1933/07/14, 83 y.o.   MRN: [id number]  [facility] PROGRESS NOTE  AARIA HAPP PXT:[id number] DOB: [birth year] DOA: 12/19/2016 PCP: Tama High III, MD  HPI/Subjective: Better shortness of breath and cough. Off BIPAP, on O2 [place] 3L. Objective: Vitals:   12/24/16 1045 12/24/16 1100  BP:  92/67  Pulse:  92  Resp:  (!) 31  Temp: 98.2 F (36.8 C)   SpO2:  95%    Filed Weights   12/19/16 0130 12/19/16 0522  Weight: 160 lb (72.6 kg) 159 lb 11.2 oz (72.4 kg)    ROS: Review of Systems  Constitutional: Negative for chills and fever.  HENT: Positive for hearing loss.   Eyes: Negative for blurred vision.  Respiratory: Positive for cough and shortness of breath.   Cardiovascular: Negative for chest pain.  Gastrointestinal: Negative for abdominal pain, constipation, diarrhea, nausea and vomiting.  Genitourinary: Negative for dysuria.  Musculoskeletal: Negative for joint pain.  Neurological: Negative for dizziness and headaches.   Exam: Physical Exam  Constitutional: She is oriented to person, place, and time. She appears well-developed.  HENT:  Head: Normocephalic.  Nose: No mucosal edema.  Mouth/Throat: No oropharyngeal exudate or posterior oropharyngeal edema.  Eyes: Conjunctivae, EOM and lids are normal. Pupils are equal, round, and reactive to light.  Neck: Neck supple. No JVD present. Carotid bruit is not present. No edema present. No thyroid mass and no thyromegaly present.  Cardiovascular: Normal rate, S1 normal and S2 normal. Exam reveals no gallop.  Murmur heard. Pulses:      Dorsalis pedis pulses are 2+ on the right side, and 2+ on the left side.  Respiratory: No respiratory distress. She has decreased breath sounds in the right middle field, the right lower field, the left middle field and the left lower field. She has wheezes in the left middle field. She has rhonchi in the right lower field and the left lower  field. She has no rales.  GI: Soft. Bowel sounds are normal. She exhibits no distension. There is no tenderness.  Musculoskeletal:       Right ankle: She exhibits no swelling.       Left ankle: She exhibits no swelling.  Lymphadenopathy:    She has no cervical adenopathy.  Neurological: She is alert and oriented to person, place, and time. No cranial nerve deficit.  Skin: Skin is warm. No rash noted. Nails show no clubbing.  Psychiatric: She has a normal mood and affect.      Data Reviewed: Basic Metabolic Panel: Recent Labs  Lab 12/19/16 0525 12/20/16 0540 12/21/16 0514 12/21/16 1307 12/22/16 0524 12/24/16 0512  NA 133* 132* 132*  --  130* 134*  K 4.7 4.8 3.7  --  4.2 3.6  CL 104 102 100*  --  95* 99*  CO2 20* 20* 22  --  20* 24  GLUCOSE 161* 155* 284*  --  168* 133*  BUN 43* 55* 56*  --  70* 92*  CREATININE 1.03* 1.18* 1.26*  --  2.00* 2.09*  CALCIUM 8.4* 7.9* 7.7*  --  7.9* 8.2*  MG  --   --   --  2.2  --  2.2  PHOS  --   --   --  3.0  --   --    Liver Function Tests: No results for input(s): AST, ALT, ALKPHOS, BILITOT, PROT, ALBUMIN in the last 168 hours. CBC: Recent Labs  Lab 12/19/16 0525 12/19/16 1109  12/20/16 0540 12/21/16 0514 12/22/16 0524 12/24/16 0512  WBC 11.8*  --  8.6 5.1 3.1* 0.8*  NEUTROABS  --   --   --   --   --  0.4*  HGB 12.1 12.1 11.3* 10.8* 10.1* 9.4*  HCT 37.3  --  34.4* 33.0* 31.3* 29.1*  MCV 93.1  --  92.6 93.4 93.3 92.1  PLT 228  --  176 131* 102* 55*   Cardiac Enzymes: Recent Labs  Lab 12/19/16 0212 12/19/16 0525 12/19/16 1109 12/19/16 1718 12/22/16 1721  TROPONINI 3.16* 6.72* 10.05* 13.33* 4.18*     Studies: Dg Chest Port 1 View  Result Date: 12/23/2016 CLINICAL DATA:  Respiratory failure. EXAM: PORTABLE CHEST 1 VIEW COMPARISON:  12/22/2016 . FINDINGS: PowerPort catheter noted with tip over the superior vena cava . Cardiomegaly with bilateral from interstitial prominence and bilateral pleural effusions consistent with  CHF. Bilateral pneumonitis cannot be excluded. No pneumothorax. IMPRESSION: 1. PowerPort catheter noted with tip noted over the superior vena cava . 2. Cardiomegaly with mild pulmonary interstitial prominence and bilateral pleural effusions consistent CHF. Bilateral pneumonitis cannot be excluded. Electronically Signed   By: Marcello Moores  Register   On: 12/23/2016 09:53    Scheduled Meds: . amiodarone  200 mg Oral BID  . aspirin EC  81 mg Oral Daily  . atorvastatin  40 mg Oral Daily  . budesonide (PULMICORT) nebulizer solution  0.5 mg Nebulization BID  . feeding supplement (ENSURE ENLIVE)  237 mL Oral BID BM  . furosemide  40 mg Oral Daily  . levothyroxine  50 mcg Oral QAC breakfast  . multivitamin with minerals  1 tablet Oral Daily  . pantoprazole  40 mg Oral Q1200  . sodium chloride flush  3 mL Intravenous Q12H  . Tbo-filgastrim (GRANIX) SQ  300 mcg Subcutaneous Daily   Continuous Infusions: . sodium chloride      Assessment/Plan:  1. Atrial fibrillation with rapid ventricular response.  Continue low-dose metoprolol twice daily.  amiodarone drip was changed to amiodarone p.o.  2. Acute hypoxic respiratory failure.  On O2 [place] 3L, Off BIPAP. NEB. 3. Aspiration pneumonia on Zosyn 4. Acute systolic congestive heart failure with severe aortic stenosisLV EF: 25% -   30%.  Lasix and metoprolol if BP allows. 5. Upper GI bleed with hematemesis.  Hold Decadron.  Stopped heparin drip.   Changed Protonix po daily.  As per GI okay to start enteric-coated aspirin and Carafate. 6. NSTEMI.  Unable to do heparin drip secondary to GI bleed.  restarted enteric-coated aspirin.  Patient on low-dose metoprolol.  Continue atorvastatin. 7. 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  Ovarian cancer primary.  Patient received her second chemotherapy on Friday.   Per Dr. Tasia Catchings,  If she fully recovers, she can follow up outpatient for reevaluation if she is a candidate for additional  chemotherapy.  8. Hyperlipidemia unspecified on atorvastatin 9. Hypothyroidism unspecified. On levothyroxine. 10.  Hypotension, possible due to cardiogenic etiology.  Hold Lasix and Lopressor if BP is low. off neo drip.  Pancytopenia.  Possible due to chemo therapy.  Dr. Tasia Catchings is following up.  Follow-up CBC. Platelet transfusion if less than 10,000.  Started Granix.  Generalized weakness.  PT evaluation. Discussed with Dr. Tasia Catchings. Code Status:     Code Status Orders  (From admission, onward)        Start     Ordered   12/19/16 0526  Full code  Continuous     12/19/16 0526    Code  Status History    Date Active Date Inactive Code Status Order ID Comments User Context   11/20/2016 16:39 11/22/2016 19:50 Full Code [id number]  Idelle Crouch, MD ED    Advance Directive Documentation     Most Recent Value  Type of Advance Directive  Healthcare Power of Attorney  Pre-existing out of facility DNR order (yellow form or pink MOST form)  No data  "MOST" Form in Place?  No data     Family Communication: spoke with her daughter at the bedside Disposition Plan: to be determined.  Consultants:  Cardiology  Gastroenterology  Oncology  Critical care specialist  Time spent: 38 minutes.  Demetrios Loll  [employer]

## 2016-12-24 NOTE — Progress Notes (Addendum)
Over the last 24 hours patient complaining of some shortness of breath. Presently on nasal cannula oxygen with oxygen saturation of 97% Presently resting comfortably  Vitals:   12/24/16 0400 12/24/16 0500 12/24/16 0600 12/24/16 0700  BP: 131/62 (!) 106/54 109/62 104/62  Pulse: 93 92 92 94  Resp: (!) 34 (!) 23 (!) 29 (!) 29  Temp:  (!) 97 F (36.1 C)    TempSrc:  Axillary    SpO2: 97% 92% 96% 95%  Weight:      Height:         Gen: WDWN in NAD HEENT: NCAT, sclerae white, oropharynx normal Neck: No LAN, no JVD noted Lungs: full BS, even, non labored  Cardiovascular: irregular irregular, rate controlled, no M/R/G Abdomen: Soft, NT, +BS x4 Ext: normal bulk and tone, no edema  Neuro: PERRL, EOMI, motor/sensory grossly intact Skin: No lesions noted  BMP Latest Ref Rng & Units 12/24/2016 12/22/2016 12/21/2016  Glucose 65 - 99 mg/dL 133(H) 168(H) 284(H)  BUN 6 - 20 mg/dL 92(H) 70(H) 56(H)  Creatinine 0.44 - 1.00 mg/dL 2.09(H) 2.00(H) 1.26(H)  Sodium 135 - 145 mmol/L 134(L) 130(L) 132(L)  Potassium 3.5 - 5.1 mmol/L 3.6 4.2 3.7  Chloride 101 - 111 mmol/L 99(L) 95(L) 100(L)  CO2 22 - 32 mmol/L 24 20(L) 22  Calcium 8.9 - 10.3 mg/dL 8.2(L) 7.9(L) 7.7(L)    CBC Latest Ref Rng & Units 12/24/2016 12/22/2016 12/21/2016  WBC 3.6 - 11.0 K/uL 0.8(LL) 3.1(L) 5.1  Hemoglobin 12.0 - 16.0 g/dL 9.4(L) 10.1(L) 10.8(L)  Hematocrit 35.0 - 47.0 % 29.1(L) 31.3(L) 33.0(L)  Platelets 150 - 440 K/uL 55(L) 102(L) 131(L)    CXR: Small right pleural effusion versus atelectasis, prominent cardiac silhouette with interstitial fullness  IMPRESSION: NSTEMI AFRVR > NSR Pancytopenia. Patient is status post chemotherapy, presently afebrile. If becomes febrile will start cefepime Pulmonary edema - clinically improving Hypotension resolved Progressive renal insufficiency. Predominantly prerenal indices. Will follow closely. No indication for acute hemodialysis at this time Acute hypoxemic respiratory failure.  Stable on nasal cannula oxygen   PLAN/REC: Continue supplemental O2 to maintain SpO2 90-96% Repeat CXR today Continue prn bronchodilator therapy   Per cardiology transitioning to po amiodarone 11/15-no anticoagulation at this time due to anemia and GI bleed  Hermelinda Dellen, D.O.

## 2016-12-24 NOTE — Care Management Important Message (Signed)
Important Message  Patient Details  Name: CITLALLI WEIKEL MRN: [id number] Date of Birth: 1933-08-03   Medicare Important Message Given:  Yes Signed IM notice given    Katrina Stack, RN 12/24/2016, 5:22 PM

## 2016-12-24 NOTE — Progress Notes (Signed)
Report given to [place] RN in 2A and patient going to room 12 with son, daughter and personal belongings.

## 2016-12-24 NOTE — Progress Notes (Deleted)
Patient admitted with dx of NSTEMI,  New-onset atrial fibrillation with RVR, in the setting of acute systolic congestive heart failure, acute hypoxic respiratory failure, NSTEMI, upper GI bleed, and metastatic ovarian cancer.  NSTEMI, peak troponin 10.05, recent echo reveals severely reduced LV function of 25-30% as compared to echo in 11/2016, which was normal. Moderate to severe aortic stenosis.   Patient transferred out of ICU today to room 257 on 2A.  This RN entered the room when patient had just returned to bed after getting up to the bedside commode. Patient stated she was feeling better than a couple of days ago when I saw her in ICU.  Patient is now on 2 liters Oxygen via [place] when a couple of days ago patient was on high flow oxygen.    This RN provided patient with the booklet, "Bouncing Back after Heart Attack."  Patient verbalized understanding that she has had a heart attack.  Reviewed part of booklet with patient.  "Possible problems" after heart attack discussed with patient and in this patient's case included 1) afib RVR and 2) heart failure. Patient verbalized understanding that she is being treated for A-fib RVR.  Reviewed definition of Heart Failure and the meaning of the EF measurement. Echo performed on November 21, 2016  EF was 69 - 65% with moderate aortic valve stenosis.  Echo on December 20, 2016 EF was 25-30% with severe aortic valve stenosis.  Provided booklet, "Living Better with Heart Failure," for patient and family to read.  Acknowledged to patient this is a lot of information to digest at one time.  Patient stated she was extremely tired.   Left NSTEMI and CHF booklets at bedside for patient and family.  Uncertain of anticipated discharge date at this point.  Scheduled new patient appointment for patient to be seen in [facility] on 01/05/2017 at 8:20 a.m.    Roanna Epley, RN, BSN, [facility] Cardiovascular and Pulmonary Nurse Navigator

## 2016-12-24 NOTE — Progress Notes (Signed)
Patient admitted with dx of NSTEMI,  New-onset atrial fibrillation with RVR, in the setting of acute systolic congestive heart failure, acute hypoxic respiratory failure, NSTEMI, upper GI bleed, and metastatic ovarian cancer.  NSTEMI, peak troponin 10.05, recent echo reveals severely reduced LV function of 25-30% as compared to echo in 11/2016, which was normal. Moderate to severe aortic stenosis.   Patient transferred out of ICU today to room 257 on 2A.  This RN entered the room when patient had just returned to bed after getting up to the bedside commode. Patient stated she was feeling better than a couple of days ago when I saw her in ICU.  Patient is now on 2 liters Oxygen via [place] when a couple of days ago patient was on high flow oxygen.    This RN provided patient with the booklet, "Bouncing Back after Heart Attack."  Patient verbalized understanding that she has had a heart attack.  Reviewed part of booklet with patient.  "Possible problems" after heart attack discussed with patient and in this patient's case included 1) afib RVR and 2) heart failure. Patient verbalized understanding that she is being treated for A-fib RVR.  Reviewed definition of Heart Failure and the meaning of the EF measurement. Echo performed on November 21, 2016  EF was 70 - 65% with moderate aortic valve stenosis.  Echo on December 20, 2016 EF was 25-30% with severe aortic valve stenosis.  Provided booklet, "Living Better with Heart Failure," for patient and family to read.  Acknowledged to patient this is a lot of information to digest at one time.  Patient stated she was extremely tired.   Left NSTEMI and CHF booklets at bedside for patient and family.  Uncertain of anticipated discharge date at this point.  Scheduled new patient appointment for patient to be seen in [facility] on 01/05/2017 at 8:20 a.m.    Roanna Epley, RN, BSN, [facility] Cardiovascular and Pulmonary Nurse Navigator

## 2016-12-24 NOTE — Care Management (Signed)
Transferred out of icu to 2A this afternoon.  Have requested Physical therapy consult.

## 2016-12-25 LAB — BASIC METABOLIC PANEL
ANION GAP: 9 (ref 5–15)
BUN: 82 mg/dL — AB (ref 6–20)
CHLORIDE: 101 mmol/L (ref 101–111)
CO2: 25 mmol/L (ref 22–32)
Calcium: 8.1 mg/dL — ABNORMAL LOW (ref 8.9–10.3)
Creatinine, Ser: 1.62 mg/dL — ABNORMAL HIGH (ref 0.44–1.00)
GFR calc Af Amer: 33 mL/min — ABNORMAL LOW (ref >60)
GFR calc non Af Amer: 29 mL/min — ABNORMAL LOW (ref >60)
Glucose, Bld: 104 mg/dL — ABNORMAL HIGH (ref 65–99)
POTASSIUM: 3.4 mmol/L — AB (ref 3.5–5.1)
SODIUM: 135 mmol/L (ref 135–145)

## 2016-12-25 LAB — CBC
HCT: 28.7 % — ABNORMAL LOW (ref 35.0–47.0)
HEMOGLOBIN: 9.5 g/dL — AB (ref 12.0–16.0)
MCH: 30.2 pg (ref 26.0–34.0)
MCHC: 33 g/dL (ref 32.0–36.0)
MCV: 91.5 fL (ref 80.0–100.0)
Platelets: 61 10*3/uL — ABNORMAL LOW (ref 150–440)
RBC: 3.14 MIL/uL — AB (ref 3.80–5.20)
RDW: 16.2 % — ABNORMAL HIGH (ref 11.5–14.5)
WBC: 0.4 10*3/uL — CL (ref 3.6–11.0)

## 2016-12-25 MED ORDER — METOPROLOL TARTRATE 5 MG/5ML IV SOLN: 5.0000 mg | INTRAVENOUS | Status: DC | PRN

## 2016-12-25 NOTE — Progress Notes (Signed)
Refused bipap for the night 

## 2016-12-25 NOTE — Progress Notes (Signed)
Patient ID: Ebony Navarro, female   DOB: [birth year], 83 y.o.   MRN: [id number]  [employer] PROGRESS NOTE  Ebony Navarro [id number] DOB: [birth year] DOA: 12/19/2016 PCP: Adin Hector, MD  HPI/Subjective: Patient is breathing is improved   Objective: Vitals:   12/25/16 0811 12/25/16 0905  BP:  106/60  Pulse:  91  Resp:  18  Temp:  (!) 97.4 F (36.3 C)  SpO2: 97% 92%    Filed Weights   12/19/16 0522 12/24/16 2006 12/25/16 0500  Weight: 159 lb 11.2 oz (72.4 kg) 172 lb (78 kg) 166 lb 14.4 oz (75.7 kg)    ROS: Review of Systems  Constitutional: Negative for chills and fever.  HENT: Positive for hearing loss.   Eyes: Negative for blurred vision.  Respiratory: Positive for cough and shortness of breath.   Cardiovascular: Negative for chest pain.  Gastrointestinal: Negative for abdominal pain, constipation, diarrhea, nausea and vomiting.  Genitourinary: Negative for dysuria.  Musculoskeletal: Negative for joint pain.  Neurological: Negative for dizziness and headaches.   Exam: Physical Exam  Constitutional: She is oriented to person, place, and time. She appears well-developed.  HENT:  Head: Normocephalic.  Nose: No mucosal edema.  Mouth/Throat: No oropharyngeal exudate or posterior oropharyngeal edema.  Eyes: Conjunctivae, EOM and lids are normal. Pupils are equal, round, and reactive to light.  Neck: Neck supple. No JVD present. Carotid bruit is not present. No edema present. No thyroid mass and no thyromegaly present.  Cardiovascular: Normal rate, S1 normal and S2 normal. Exam reveals no gallop.  Murmur heard. Pulses:      Dorsalis pedis pulses are 2+ on the right side, and 2+ on the left side.  Respiratory: No respiratory distress. She has decreased breath sounds in the right middle field, the right lower field, the left middle field and the left lower field. She has wheezes in the left middle field. She has rhonchi in the right lower field and  the left lower field. She has no rales.  GI: Soft. Bowel sounds are normal. She exhibits no distension. There is no tenderness.  Musculoskeletal:       Right ankle: She exhibits no swelling.       Left ankle: She exhibits no swelling.  Lymphadenopathy:    She has no cervical adenopathy.  Neurological: She is alert and oriented to person, place, and time. No cranial nerve deficit.  Skin: Skin is warm. No rash noted. Nails show no clubbing.  Psychiatric: She has a normal mood and affect.      Data Reviewed: Basic Metabolic Panel: Recent Labs  Lab 12/20/16 0540 12/21/16 0514 12/21/16 1307 12/22/16 0524 12/24/16 0512 12/25/16 0446  NA 132* 132*  --  130* 134* 135  K 4.8 3.7  --  4.2 3.6 3.4*  CL 102 100*  --  95* 99* 101  CO2 20* 22  --  20* 24 25  GLUCOSE 155* 284*  --  168* 133* 104*  BUN 55* 56*  --  70* 92* 82*  CREATININE 1.18* 1.26*  --  2.00* 2.09* 1.62*  CALCIUM 7.9* 7.7*  --  7.9* 8.2* 8.1*  MG  --   --  2.2  --  2.2  --   PHOS  --   --  3.0  --   --   --    Liver Function Tests: No results for input(s): AST, ALT, ALKPHOS, BILITOT, PROT, ALBUMIN in the last 168 hours. CBC: Recent Labs  Lab 12/20/16  1610 12/21/16 0514 12/22/16 0524 12/24/16 0512 12/25/16 0446  WBC 8.6 5.1 3.1* 0.8* 0.4*  NEUTROABS  --   --   --  0.4*  --   HGB 11.3* 10.8* 10.1* 9.4* 9.5*  HCT 34.4* 33.0* 31.3* 29.1* 28.7*  MCV 92.6 93.4 93.3 92.1 91.5  PLT 176 131* 102* 55* 61*   Cardiac Enzymes: Recent Labs  Lab 12/19/16 0212 12/19/16 0525 12/19/16 1109 12/19/16 1718 12/22/16 1721  TROPONINI 3.16* 6.72* 10.05* 13.33* 4.18*     Studies: No results found.  Scheduled Meds: . amiodarone  200 mg Oral BID  . aspirin EC  81 mg Oral Daily  . atorvastatin  40 mg Oral Daily  . budesonide (PULMICORT) nebulizer solution  0.5 mg Nebulization BID  . feeding supplement (ENSURE ENLIVE)  237 mL Oral BID BM  . furosemide  40 mg Oral Daily  . levothyroxine  50 mcg Oral QAC breakfast  .  multivitamin with minerals  1 tablet Oral Daily  . pantoprazole  40 mg Oral Q1200  . sodium chloride flush  3 mL Intravenous Q12H  . Tbo-filgastrim (GRANIX) SQ  300 mcg Subcutaneous Daily   Continuous Infusions: . sodium chloride      Assessment/Plan:  1. Atrial fibrillation with rapid ventricular response.  Continue low-dose metoprolol twice daily.  Continue oral amiodarone, heart rate stable 2. Acute hypoxic respiratory failure.  Improved continue oxygen wean as tolerated 3. Aspiration pneumonia on Zosyn changed to Augmentin soon 4. Acute systolic congestive heart failure with severe aortic stenosisLV EF: 25% -   30%.  Lasix and metoprolol if BP allows. 5. Upper GI bleed with hematemesis.  Continue Carafate and Protonix 6. NSTEMI.  Unable to do heparin drip secondary to GI bleed.  restarted enteric-coated aspirin.  Patient on low-dose metoprolol.  Continue atorvastatin. 7. 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  Ovarian cancer primary.  Patient received her second chemotherapy on Friday.   Per Dr. Tasia Catchings,  If she fully recovers, she can follow up outpatient for reevaluation if she is a candidate for additional chemotherapy.  8. Hyperlipidemia unspecified on atorvastatin 9. Hypothyroidism unspecified. On levothyroxine. 10. Pancytopenia.  Continue Granix.  For low white blood cell count  Generalized weakness.  PT evaluation.   Code Status:     Code Status Orders  (From admission, onward)        Start     Ordered   12/19/16 0526  Full code  Continuous     12/19/16 0526    Code Status History    Date Active Date Inactive Code Status Order ID Comments User Context   11/20/2016 16:39 11/22/2016 19:50 Full Code [id number]  Idelle Crouch, MD ED    Advance Directive Documentation     Most Recent Value  Type of Advance Directive  Healthcare Power of Attorney  Pre-existing out of facility DNR order (yellow form or pink MOST form)  No data  "MOST" Form in Place?  No  data     Family Communication: spoke with her daughter at the bedside Disposition Plan: to be determined.  Consultants:  Cardiology  Gastroenterology  Oncology  Critical care specialist  Time spent: 32 minutes.  Posey Pronto [facility]

## 2016-12-25 NOTE — Progress Notes (Signed)
DR Leslye Peer was made aware of pt heart rhythm changing form sinus to a-fib , pt already on amiodarone  Tabs, see new order will continue to monitor .

## 2016-12-25 NOTE — Progress Notes (Signed)
Placed patient on 3l [place] after neb treatment.

## 2016-12-25 NOTE — Plan of Care (Signed)
  Progressing Education: Knowledge of General Education information will improve 12/25/2016 1158 - Progressing by Darrelyn Hillock, RN Health Behavior/Discharge Planning: Ability to manage health-related needs will improve 12/25/2016 1158 - Progressing by Darrelyn Hillock, RN Clinical Measurements: Ability to maintain clinical measurements within normal limits will improve 12/25/2016 1158 - Progressing by Darrelyn Hillock, RN Will remain free from infection 12/25/2016 1158 - Progressing by Darrelyn Hillock, RN Diagnostic test results will improve 12/25/2016 1158 - Progressing by Darrelyn Hillock, RN Respiratory complications will improve 12/25/2016 1158 - Progressing by Darrelyn Hillock, RN Cardiovascular complication will be avoided 12/25/2016 1158 - Progressing by Darrelyn Hillock, RN Activity: Risk for activity intolerance will decrease 12/25/2016 1158 - Progressing by Darrelyn Hillock, RN Pain Managment: General experience of comfort will improve 12/25/2016 1158 - Progressing by Darrelyn Hillock, RN Safety: Ability to remain free from injury will improve 12/25/2016 1158 - Progressing by Darrelyn Hillock, RN Spiritual Needs Ability to function at adequate level 12/25/2016 1158 - Progressing by Darrelyn Hillock, RN Education: Understanding of cardiac disease, CV risk reduction, and recovery process will improve 12/25/2016 1158 - Progressing by Darrelyn Hillock, RN Understanding of medication regimen will improve 12/25/2016 1158 - Progressing by Darrelyn Hillock, RN Cardiac: Ability to achieve and maintain adequate cardiopulmonary perfusion will improve 12/25/2016 1158 - Progressing by Darrelyn Hillock, RN Vascular access site(s) Level 0-1 will be maintained 12/25/2016 1158 - Progressing by Darrelyn Hillock, [place] Behavior/Discharge Planning: Ability to  safely manage health-related needs after discharge will improve 12/25/2016 1158 - Progressing by Darrelyn Hillock, RN

## 2016-12-25 NOTE — Progress Notes (Signed)
PT Cancellation Note  Patient Details Name: Ebony Navarro MRN: [id number] DOB: [birth year]   Cancelled Treatment:    Reason Eval/Treat Not Completed: Medical issues which prohibited therapy.  Will wait for guidance on elevated troponin.   Ramond Dial 12/25/2016, 9:02 AM   Mee Hives, PT MS Acute Rehab Dept. Number: [place]

## 2016-12-26 ENCOUNTER — Inpatient Hospital Stay: Payer: Medicare Other

## 2016-12-26 LAB — CBC
HEMATOCRIT: 30.7 % — AB (ref 35.0–47.0)
HEMOGLOBIN: 10 g/dL — AB (ref 12.0–16.0)
MCH: 30.3 pg (ref 26.0–34.0)
MCHC: 32.7 g/dL (ref 32.0–36.0)
MCV: 92.8 fL (ref 80.0–100.0)
Platelets: 81 10*3/uL — ABNORMAL LOW (ref 150–440)
RBC: 3.31 MIL/uL — AB (ref 3.80–5.20)
RDW: 16.5 % — ABNORMAL HIGH (ref 11.5–14.5)
WBC: 1 10*3/uL — AB (ref 3.6–11.0)

## 2016-12-26 LAB — BASIC METABOLIC PANEL
ANION GAP: 9 (ref 5–15)
BUN: 77 mg/dL — ABNORMAL HIGH (ref 6–20)
CALCIUM: 8.5 mg/dL — AB (ref 8.9–10.3)
CO2: 26 mmol/L (ref 22–32)
Chloride: 100 mmol/L — ABNORMAL LOW (ref 101–111)
Creatinine, Ser: 1.56 mg/dL — ABNORMAL HIGH (ref 0.44–1.00)
GFR, EST AFRICAN AMERICAN: 35 mL/min — AB (ref >60)
GFR, EST NON AFRICAN AMERICAN: 30 mL/min — AB (ref >60)
GLUCOSE: 162 mg/dL — AB (ref 65–99)
POTASSIUM: 4.3 mmol/L (ref 3.5–5.1)
SODIUM: 135 mmol/L (ref 135–145)

## 2016-12-26 MED ORDER — FUROSEMIDE 10 MG/ML IJ SOLN
20.0000 mg | Freq: Two times a day (BID) | INTRAMUSCULAR | Status: DC
  Administered 2016-12-27 – 2016-12-28 (×3): 20 mg via INTRAVENOUS
  Filled 2016-12-26 (×3): qty 2

## 2016-12-26 MED ORDER — FUROSEMIDE 10 MG/ML IJ SOLN
40.0000 mg | Freq: Once | INTRAMUSCULAR | Status: AC
  Administered 2016-12-26: 40 mg via INTRAVENOUS
  Filled 2016-12-26: qty 4

## 2016-12-26 MED ORDER — DIGOXIN 125 MCG PO TABS
0.1250 mg | ORAL_TABLET | Freq: Every day | ORAL | Status: DC
  Administered 2016-12-26 – 2016-12-30 (×5): 0.125 mg via ORAL
  Filled 2016-12-26 (×5): qty 1

## 2016-12-26 NOTE — Progress Notes (Signed)
PT Cancellation Note  Patient Details Name: Ebony Navarro MRN: [id number] DOB: Sep 28, 1933   Cancelled Treatment:    Reason Eval/Treat Not Completed: Medical issues which prohibited therapy.  Pt quite SOB and family wanted PT to wait to tomorrow, although her respiratory therapist arrived to see her and treat her.  Pt was assisted to higher bed posture and then left with RT.  Will try again tomorrow.   Ramond Dial 12/26/2016, 2:49 PM   Mee Hives, PT MS Acute Rehab Dept. Number: [place] and [place]

## 2016-12-26 NOTE — Progress Notes (Addendum)
Patient ID: Ebony Navarro, female   DOB: [birth year], 83 y.o.   MRN: [id number]  [employer] [facility] PROGRESS NOTE  Ebony Navarro [id number] DOB: 10-05-1933 DOA: 12/19/2016 PCP: Adin Hector, MD  HPI/Subjective: Patient's breathing is improved Denies any chest pain   Objective: Vitals:   12/26/16 1359 12/26/16 1435  BP:    Pulse: 100 (!) 103  Resp:    Temp:    SpO2: 96% 92%    Filed Weights   12/24/16 2006 12/25/16 0500 12/26/16 0412  Weight: 172 lb (78 kg) 166 lb 14.4 oz (75.7 kg) 161 lb 11.2 oz (73.3 kg)    ROS: Review of Systems  Constitutional: Negative for chills and fever.  HENT: Positive for hearing loss.   Eyes: Negative for blurred vision.  Respiratory: Positive for cough and shortness of breath.   Cardiovascular: Negative for chest pain.  Gastrointestinal: Negative for abdominal pain, constipation, diarrhea, nausea and vomiting.  Genitourinary: Negative for dysuria.  Musculoskeletal: Negative for joint pain.  Neurological: Negative for dizziness and headaches.   Exam: Physical Exam  Constitutional: She is oriented to person, place, and time. She appears well-developed.  HENT:  Head: Normocephalic.  Nose: No mucosal edema.  Mouth/Throat: No oropharyngeal exudate or posterior oropharyngeal edema.  Eyes: Conjunctivae, EOM and lids are normal. Pupils are equal, round, and reactive to light.  Neck: Neck supple. No JVD present. Carotid bruit is not present. No edema present. No thyroid mass and no thyromegaly present.  Cardiovascular: Normal rate, S1 normal and S2 normal. Exam reveals no gallop.  Murmur heard. Pulses:      Dorsalis pedis pulses are 2+ on the right side, and 2+ on the left side.  Respiratory: No respiratory distress. She has decreased breath sounds in the right middle field, the right lower field, the left middle field and the left lower field. She has wheezes in the left middle field. She has rhonchi in the right lower field and  the left lower field. She has no rales.  GI: Soft. Bowel sounds are normal. She exhibits no distension. There is no tenderness.  Musculoskeletal:       Right ankle: She exhibits no swelling.       Left ankle: She exhibits no swelling.  Lymphadenopathy:    She has no cervical adenopathy.  Neurological: She is alert and oriented to person, place, and time. No cranial nerve deficit.  Skin: Skin is warm. No rash noted. Nails show no clubbing.  Psychiatric: She has a normal mood and affect.      Data Reviewed: Basic Metabolic Panel: Recent Labs  Lab 12/21/16 0514 12/21/16 1307 12/22/16 0524 12/24/16 0512 12/25/16 0446 12/26/16 0456  NA 132*  --  130* 134* 135 135  K 3.7  --  4.2 3.6 3.4* 4.3  CL 100*  --  95* 99* 101 100*  CO2 22  --  20* 24 25 26   GLUCOSE 284*  --  168* 133* 104* 162*  BUN 56*  --  70* 92* 82* 77*  CREATININE 1.26*  --  2.00* 2.09* 1.62* 1.56*  CALCIUM 7.7*  --  7.9* 8.2* 8.1* 8.5*  MG  --  2.2  --  2.2  --   --   PHOS  --  3.0  --   --   --   --    Liver Function Tests: No results for input(s): AST, ALT, ALKPHOS, BILITOT, PROT, ALBUMIN in the last 168 hours. CBC: Recent Labs  Lab 12/21/16  2778 12/22/16 0524 12/24/16 0512 12/25/16 0446 12/26/16 0456  WBC 5.1 3.1* 0.8* 0.4* 1.0*  NEUTROABS  --   --  0.4*  --   --   HGB 10.8* 10.1* 9.4* 9.5* 10.0*  HCT 33.0* 31.3* 29.1* 28.7* 30.7*  MCV 93.4 93.3 92.1 91.5 92.8  PLT 131* 102* 55* 61* 81*   Cardiac Enzymes: Recent Labs  Lab 12/19/16 1718 12/22/16 1721  TROPONINI 13.33* 4.18*     Studies: No results found.  Scheduled Meds: . amiodarone  200 mg Oral BID  . aspirin EC  81 mg Oral Daily  . atorvastatin  40 mg Oral Daily  . budesonide (PULMICORT) nebulizer solution  0.5 mg Nebulization BID  . feeding supplement (ENSURE ENLIVE)  237 mL Oral BID BM  . furosemide  40 mg Oral Daily  . levothyroxine  50 mcg Oral QAC breakfast  . multivitamin with minerals  1 tablet Oral Daily  . pantoprazole   40 mg Oral Q1200  . sodium chloride flush  3 mL Intravenous Q12H   Continuous Infusions: . sodium chloride      Assessment/Plan:  1. Atrial fibrillation with rapid ventricular response.  Continue low-dose metoprolol twice daily.  Continue oral amiodarone, heart rate elevated however she is sinus tachycardia 2. Acute hypoxic respiratory failure.  Wean oxygen as tolerated 3. Aspiration pneumonia status post treatment with antibiotics 4. Acute systolic congestive heart failure with severe aortic stenosisLV EF: 25% -   30%.  Increase Lasix dose and digoxin 5. Upper GI bleed with hematemesis.  Continue Carafate and Protonix 6. NSTEMI.  Unable to do heparin drip secondary to GI bleed.  restarted enteric-coated aspirin.  Patient on low-dose metoprolol.  Continue atorvastatin.  Due to acute renal failure recent GI bleed not a good candidate for cardiac catheterization 7. 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  Ovarian cancer primary.  Patient received her second chemotherapy on Friday.   Per Dr. Tasia Catchings,  If she fully recovers, she can follow up outpatient for reevaluation if she is a candidate for additional chemotherapy.  8. Hyperlipidemia unspecified on atorvastatin 9. Hypothyroidism unspecified. On levothyroxine. 10. Pancytopenia.  Continue Granix.  For low white blood cell count  Generalized weakness.  PT evaluation.   Code Status:     Code Status Orders  (From admission, onward)        Start     Ordered   12/19/16 0526  Full code  Continuous     12/19/16 0526    Code Status History    Date Active Date Inactive Code Status Order ID Comments User Context   11/20/2016 16:39 11/22/2016 19:50 Full Code [id number]  Idelle Crouch, MD ED    Advance Directive Documentation     Most Recent Value  Type of Advance Directive  Healthcare Power of Attorney  Pre-existing out of facility DNR order (yellow form or pink MOST form)  No data  "MOST" Form in Place?  No data      Family Communication: spoke with her daughter at the bedside Disposition Plan: to be determined.  Consultants:  Cardiology  Gastroenterology  Oncology  Critical care specialist  Time spent: 32 minutes.  Posey Pronto [facility]

## 2016-12-26 NOTE — Progress Notes (Signed)
Pt. Slept throughout the night with c/o pain x1, pt medicated with effective results. HR will increase to 140's non sustaining, pt.ran ST throughout the night in low 100's up to one teens.

## 2016-12-26 NOTE — Progress Notes (Signed)
Pt complaining of sob, SPO2 94% on 2L n/c, Duoned tx given by RT. However patient still complaining of sob, chest tightness. MD Dustin Flock aware. Orders to give 40 mg IV lasix, and STAT chest xray. Will continue to monitor.

## 2016-12-27 DIAGNOSIS — D702 Other drug-induced agranulocytosis: Secondary | ICD-10-CM

## 2016-12-27 LAB — CBC
HEMATOCRIT: 31.2 % — AB (ref 35.0–47.0)
Hemoglobin: 10.3 g/dL — ABNORMAL LOW (ref 12.0–16.0)
MCH: 30.6 pg (ref 26.0–34.0)
MCHC: 33 g/dL (ref 32.0–36.0)
MCV: 92.6 fL (ref 80.0–100.0)
Platelets: 78 10*3/uL — ABNORMAL LOW (ref 150–440)
RBC: 3.37 MIL/uL — ABNORMAL LOW (ref 3.80–5.20)
RDW: 16.7 % — AB (ref 11.5–14.5)
WBC: 3.2 10*3/uL — AB (ref 3.6–11.0)

## 2016-12-27 LAB — BASIC METABOLIC PANEL
ANION GAP: 9 (ref 5–15)
BUN: 79 mg/dL — AB (ref 6–20)
CALCIUM: 8.4 mg/dL — AB (ref 8.9–10.3)
CO2: 26 mmol/L (ref 22–32)
CREATININE: 1.46 mg/dL — AB (ref 0.44–1.00)
Chloride: 95 mmol/L — ABNORMAL LOW (ref 101–111)
GFR calc Af Amer: 38 mL/min — ABNORMAL LOW (ref >60)
GFR calc non Af Amer: 33 mL/min — ABNORMAL LOW (ref >60)
GLUCOSE: 200 mg/dL — AB (ref 65–99)
Potassium: 4.2 mmol/L (ref 3.5–5.1)
Sodium: 130 mmol/L — ABNORMAL LOW (ref 135–145)

## 2016-12-27 LAB — GLUCOSE, CAPILLARY
Glucose-Capillary: 176 mg/dL — ABNORMAL HIGH (ref 65–99)
Glucose-Capillary: 162 mg/dL — ABNORMAL HIGH (ref 65–99)

## 2016-12-27 MED ORDER — HYDROXYZINE HCL 25 MG PO TABS
25.0000 mg | ORAL_TABLET | Freq: Three times a day (TID) | ORAL | Status: DC | PRN
  Administered 2016-12-27 – 2017-01-01 (×3): 25 mg via ORAL
  Filled 2016-12-27 (×3): qty 1

## 2016-12-27 MED ORDER — INSULIN ASPART 100 UNIT/ML ~~LOC~~ SOLN
0.0000 [IU] | Freq: Three times a day (TID) | SUBCUTANEOUS | Status: DC
  Administered 2016-12-27: 2 [IU] via SUBCUTANEOUS
  Administered 2016-12-28 – 2016-12-29 (×2): 1 [IU] via SUBCUTANEOUS
  Administered 2016-12-29: 2 [IU] via SUBCUTANEOUS
  Administered 2016-12-30: 1 [IU] via SUBCUTANEOUS
  Administered 2016-12-30 – 2016-12-31 (×2): 2 [IU] via SUBCUTANEOUS
  Administered 2017-01-01: 1 [IU] via SUBCUTANEOUS
  Filled 2016-12-27 (×9): qty 1

## 2016-12-27 NOTE — Progress Notes (Signed)
Patient requested to have BiPAP turned off and her nasal cannula put back on. Patient on 5L nasal cannula and resting comfortably. Nursing staff will continue to monitor for any changes in patient status. Earleen Reaper, RN

## 2016-12-27 NOTE — Progress Notes (Signed)
Nutrition Follow Up Note   DOCUMENTATION CODES:   Not applicable  INTERVENTION:   Continue Ensure Enlive po BID, each supplement provides 350 kcal and 20 grams of protein  Add Magic cup TID with meals, each supplement provides 290 kcal and 9 grams of protein  MVI daily  Recommend bowel regimen as needed  NUTRITION DIAGNOSIS:   Increased nutrient needs related to cancer and cancer related treatments as evidenced by increased estimated needs from protein.  GOAL:   Patient will meet greater than or equal to 90% of their needs  - not meeting   MONITOR:   PO intake, Supplement acceptance, Labs, Weight trends, I & O's   ASSESSMENT:   83 y.o. female with a complicated medical history that includes malignant ovarian cancer with metastatic adenocarcinoma to peritoneum for which she is currently actively undergoing chemo and just started her chemotherapy a little over 3 weeks ago admitted with NSTEMI and GIB   Pt ordering mostly soups and oatmeal. Pt drinks one Ensure per day. RD will add Magic Cups with meals as soup does not provide a good source of protein. Pt with initial weight gain but has lost back down to 4lbs above her admit weight. Pt receiving lasix. Pt received her second chemotherapy treatment on 11/16. Please encourage intake of protein rich foods with meals.   Medications reviewed and include: aspirin, lasix, insulin, synthroid, MVI, protonix, morphine   Labs reviewed: Na 130(L), Cl 95(L), BUN 79(H), creat 1.46(H), Ca 8.4(L) P 3.0 wnl- 11/13 Mg 2.2- 11/16 Wbc- 3.2(L), Hgb 10.3(L), Hct 31.2(L) cbgs- 162, 200 x 24 hrs  Diet Order:  DIET SOFT Room service appropriate? Yes; Fluid consistency: Thin  EDUCATION NEEDS:   Education needs have been addressed  Skin: Reviewed RN Assessment  Last BM:  11/17- TYPE 3  Height:   Ht Readings from Last 1 Encounters:  12/19/16 5\' 7"  (1.702 m)    Weight:   Wt Readings from Last 1 Encounters:  12/27/16 163 lb 6.4 oz  (74.1 kg)    Ideal Body Weight:  61.36 kg  BMI:  Body mass index is 25.59 kg/m.  Estimated Nutritional Needs:   Kcal:  1600-1800kcal/day   Protein:  80-94g/day   Fluid:  >1.6L/day   Koleen Distance MS, RD, LDN Pager #[phone] After Hours Pager: [phone]

## 2016-12-27 NOTE — Clinical Social Work Note (Signed)
Clinical Social Work Assessment  Patient Details  Name: Ebony Navarro MRN: [id number] Date of Birth: 1933/06/09  Date of referral:  12/27/16               Reason for consult:  Facility Placement                Permission sought to share information with:  Facility Sport and exercise psychologist, Family Supports Permission granted to share information::  Yes, Verbal Permission Granted  Name::     Chase, Knebel [phone] or [name]   [phone]   Agency::  SNF admissions  Relationship::     Contact Information:     Housing/Transportation Living arrangements for the past 2 months:  Single Family Home Source of Information:  Patient, Adult Children Patient Interpreter Needed:  None Criminal Activity/Legal Involvement Pertinent to Current Situation/Hospitalization:  No - Comment as needed Significant Relationships:  Adult Children Lives with:  Self Do you feel safe going back to the place where you live?  No Need for family participation in patient care:  No (Coment)  Care giving concerns:  Patient and her family feels like she needs some rehab before she is able to return back home.   Social Worker assessment / plan:  Patient is an 83 year old female who is alert and oriented x4.  Patient states she has not been to rehab before, CSW explained to patient and family about what to expect at SNF and the process for trying to find placement.  CSW explained to patient how insurance will pay for the stay and how the social worker at facility will assist with discharge planning.  Patient states she lives by herself, and family visit her regularly.  Patient was explained role of social worker and the steps to find placement.  Patient and her family did not have any other questions or concerns and gave CSW permission to begin bed search in [place].  Employment status:  Retired Nurse, adult PT Recommendations:  [place] / Referral to community resources:  [place]  Patient/Family's Response to care:  Patient and family are agreeable to going to SNF for short term rehab.  Patient/Family's Understanding of and Emotional Response to Diagnosis, Current Treatment, and Prognosis: Patient and family are hopeful that she will not have to be at [facility] for very long.  Emotional Assessment Appearance:  Appears stated age Attitude/Demeanor/Rapport:    Affect (typically observed):  Appropriate, Calm Orientation:  Oriented to Self, Oriented to Place, Oriented to  Time, Oriented to Situation Alcohol / Substance use:  Not Applicable Psych involvement (Current and /or in the community):  No (Comment)  Discharge Needs  Concerns to be addressed:  Lack of Support Readmission within the last 30 days:  No Current discharge risk:  Lack of support system, Lives alone Barriers to Discharge:  Continued Medical Work up   Anell Barr 12/27/2016, 5:49 PM

## 2016-12-27 NOTE — NC FL2 (Signed)
[place] LEVEL OF CARE SCREENING TOOL     IDENTIFICATION  Patient Name: Ebony Navarro Birthdate: 14-Jan-1934 Sex: female Admission Date (Current Location): 12/19/2016  [place] and [place] Number:  Engineering geologist and Address:  [facility], [address], [place], [place] [address]      Provider Number: [id number]  Attending Physician Name and Address:  Dustin Flock, MD  Relative Name and Phone Number:  Carrera, Kiesel [phone] or [name] Daughter   [phone]     Current Level of Care: Hospital Recommended Level of Care: [place] Prior Approval Number:    Date Approved/Denied:   PASRR Number: [id number]  Discharge Plan: SNF    Current Diagnoses: Patient Active Problem List   Diagnosis Date Noted  . Neutropenia, drug-induced ([place])   . Thrombocytopenia ([place])   . Acute respiratory failure with hypoxemia ([place])   . Shortness of breath   . NSTEMI (non-ST elevated myocardial infarction) ([place]) 12/19/2016  . Ovarian cancer ([place]) 11/24/2016  . Malignant neoplasm of ovary ([place]) 11/24/2016  . Abdominal pain 11/20/2016  . Ascites 11/20/2016  . Ovarian mass 11/20/2016  . Absolute anemia 01/22/2015  . Anxiety 01/22/2015  . Malignant neoplasm of breast ([place]) 01/22/2015  . Clinical depression 01/22/2015  . Gastric catarrh 01/22/2015  . Blood glucose elevated 01/22/2015  . HLD (hyperlipidemia) 01/22/2015  . BP (high blood pressure) 01/22/2015  . Acquired atrophy of thyroid 04/02/2014  . Frequent UTI 11/23/2013  . Chronic kidney disease (CKD), stage III (moderate) (HCC) 10/02/2013    Orientation RESPIRATION BLADDER Height & Weight     Self, Time, Situation, Place  O2(3L) Continent Weight: 163 lb 6.4 oz (74.1 kg) Height:  5\' 7"  (170.2 cm)  BEHAVIORAL SYMPTOMS/MOOD NEUROLOGICAL BOWEL NUTRITION STATUS      Continent Diet(Mechanical Soft)  AMBULATORY STATUS COMMUNICATION OF NEEDS Skin    Limited Assist Verbally Normal                       Personal Care Assistance Level of Assistance  Bathing, Feeding, Dressing Bathing Assistance: Limited assistance Feeding assistance: Independent Dressing Assistance: Limited assistance     Functional Limitations Info  Sight, Hearing, Speech Sight Info: Adequate Hearing Info: Adequate Speech Info: Adequate    SPECIAL CARE FACTORS FREQUENCY  PT (By licensed PT)     PT Frequency: 5x a week              Contractures Contractures Info: Not present    Additional Factors Info  Allergies, Code Status, Insulin Sliding Scale Code Status Info: DNR Allergies Info: ACE INHIBITORS, ATENOLOL, IODINE, METOPROLOL TARTRATE, OMEPRAZOLE, POVIDONE-IODINE, ROFECOXIB, CIPROFLOXACIN, DOXYCYCLINE CALCIUM, NITROFURANTOIN, QUINOLONES, SULFA ANTIBIOTICS    Insulin Sliding Scale Info: insulin aspart (novoLOG) injection 0-9 Units 3x a day with meals       Current Medications (12/27/2016):  This is the current hospital active medication list Current Facility-Administered Medications  Medication Dose Route Frequency Provider Last Rate Last Dose  . 0.9 %  sodium chloride infusion  250 mL Intravenous PRN Pyreddy, Reatha Harps, MD      . acetaminophen (TYLENOL) tablet 650 mg  650 mg Oral Q4H PRN Loletha Grayer, MD   650 mg at 12/24/16 2358  . ALPRAZolam Duanne Moron) tablet 0.25 mg  0.25 mg Oral BID PRN Awilda Bill, NP   0.25 mg at 12/26/16 1934  . amiodarone (PACERONE) tablet 200 mg  200 mg Oral BID Clabe Seal, PA-C   200 mg  at 12/27/16 1030  . aspirin EC tablet 81 mg  81 mg Oral Daily Loletha Grayer, MD   81 mg at 12/27/16 1031  . atorvastatin (LIPITOR) tablet 40 mg  40 mg Oral Daily Pyreddy, Pavan, MD   40 mg at 12/27/16 1030  . budesonide (PULMICORT) nebulizer solution 0.5 mg  0.5 mg Nebulization BID Loletha Grayer, MD   0.5 mg at 12/27/16 0816  . digoxin (LANOXIN) tablet 0.125 mg  0.125 mg Oral Daily Dustin Flock, MD   0.125 mg at  12/27/16 1030  . feeding supplement (ENSURE ENLIVE) (ENSURE ENLIVE) liquid 237 mL  237 mL Oral BID BM Loletha Grayer, MD   237 mL at 12/27/16 1358  . furosemide (LASIX) injection 20 mg  20 mg Intravenous Q12H Dustin Flock, MD   20 mg at 12/27/16 1714  . insulin aspart (novoLOG) injection 0-9 Units  0-9 Units Subcutaneous TID WC Dustin Flock, MD      . ipratropium-albuterol (DUONEB) 0.5-2.5 (3) MG/3ML nebulizer solution 3 mL  3 mL Nebulization Q4H PRN Wilhelmina Mcardle, MD   3 mL at 12/27/16 0816  . levothyroxine (SYNTHROID, LEVOTHROID) tablet 50 mcg  50 mcg Oral QAC breakfast Saundra Shelling, MD   50 mcg at 12/27/16 0911  . metoprolol tartrate (LOPRESSOR) injection 5 mg  5 mg Intravenous Q1H PRN Wieting, Richard, MD      . morphine 2 MG/ML injection 2 mg  2 mg Intravenous Q4H PRN Saundra Shelling, MD   2 mg at 12/27/16 0525  . multivitamin with minerals tablet 1 tablet  1 tablet Oral Daily Loletha Grayer, MD   1 tablet at 12/27/16 1030  . nitroGLYCERIN (NITROSTAT) SL tablet 0.4 mg  0.4 mg Sublingual Q5 Min x 3 PRN Pyreddy, Reatha Harps, MD      . ondansetron (ZOFRAN) injection 4 mg  4 mg Intravenous Q6H PRN Saundra Shelling, MD   4 mg at 12/22/16 1856  . pantoprazole (PROTONIX) EC tablet 40 mg  40 mg Oral Q1200 Wilhelmina Mcardle, MD   40 mg at 12/27/16 1130  . sodium chloride flush (NS) 0.9 % injection 3 mL  3 mL Intravenous Q12H Pyreddy, Reatha Harps, MD   3 mL at 12/27/16 1031  . sodium chloride flush (NS) 0.9 % injection 3 mL  3 mL Intravenous PRN Saundra Shelling, MD   3 mL at 12/27/16 0526     Discharge Medications: Please see discharge summary for a list of discharge medications.  Relevant Imaging Results:  Relevant Lab Results:   Additional Information SSN [id number] Patient is not currently getting any chemo.  Darleth Eustache, Jones Broom, LCSWA

## 2016-12-27 NOTE — Evaluation (Signed)
Physical Therapy Evaluation Patient Details Name: Ebony Navarro MRN: [id number] DOB: [birth year] Today's Date: 12/27/2016   History of Present Illness  Ebony Navarro is a 83 y.o. female with a known history of breast cancer, ovarian cancer, hyperlipidemia, hypertension presented to the emergency room with shortness of breath and chest pressure. Shortness of breath started last night. Patient not on home oxygen. She felt short of breath and had chest discomfort, which was aching in nature. Patient was worked up with CT chest which showed no pulmonary embolism, small bilateral pleural effusions were noted. During the workup in the emergency room troponin was elevated. Patient was started on IV heparin drip for anticoagulation and was started on cardiac medications. Hospitalist service was consulted for further care. Pt is now admitted for afib with RVR, acute hypoxic respiratory failure, aspirative PNA, acute CHF, upper GIB, and NSTEMI. Heparin drip discontinued due to upper GIB.  Clinical Impression  Pt admitted with above diagnosis. Pt currently with functional limitations due to the deficits listed below (see PT Problem List).  Pt is very weak with therapy today. She requires assist for bed mobility, transfers, and very limited ambulation. Pt able to take short, shuffling steps to transfer from bed to recliner. Bilateral LE buckling during ambulation. Heavy UE support on rolling walker. Pt is unsteady and falling backwards during ambulation. She will need SNF placement at discharge in order to facilitate safe return to prior function. Pt will benefit from PT services to address deficits in strength, balance, and mobility in order to return to full function at home.     Follow Up Recommendations SNF    Equipment Recommendations  Other (comment)(TBD at next venue. If discharge home will need RW)    Recommendations for Other Services       Precautions / Restrictions  Precautions Precautions: Fall Restrictions Weight Bearing Restrictions: No      Mobility  Bed Mobility Overal bed mobility: Needs Assistance Bed Mobility: Supine to Sit     Supine to sit: Min assist     General bed mobility comments: Pt requires cues and assist to get to EOB. HOB elevated and bed rails utilized  Transfers Overall transfer level: Needs assistance Equipment used: Rolling walker (2 wheeled) Transfers: Sit to/from Stand Sit to Stand: Min assist         General transfer comment: Cues for safe hand placement. Assist to come to standing. Once upright pt requires support to remain in standing. Pt constantly falling backwards while in standing  Ambulation/Gait Ambulation/Gait assistance: Min assist Ambulation Distance (Feet): 3 Feet Assistive device: Rolling walker (2 wheeled) Gait Pattern/deviations: Decreased step length - right;Decreased step length - left Gait velocity: Decreased Gait velocity interpretation: <1.8 ft/sec, indicative of risk for recurrent falls General Gait Details: Pt able to take short, shuffling steps to transfer from bed to recliner. Bilateral LE buckling during ambulation. Heavy UE support on rolling walker. Pt is unsteady and falling backwards during ambulation  Stairs            Wheelchair Mobility    Modified Rankin (Stroke Patients Only)       Balance Overall balance assessment: Needs assistance Sitting-balance support: No upper extremity supported Sitting balance-Leahy Scale: Good     Standing balance support: No upper extremity supported Standing balance-Leahy Scale: Poor Standing balance comment: Requires continual support in standing and bilateral UE contact with walker  Pertinent Vitals/Pain Pain Assessment: No/denies pain    Home Living Family/patient expects to be discharged to:: Private residence Living Arrangements: Alone Available Help at Discharge: Family Type  of Home: House Home Access: Stairs to enter Entrance Stairs-Rails: Left Entrance Stairs-Number of Steps: 3 Home Layout: One level Home Equipment: Grab bars - tub/shower;Other (comment)(no home O2)      Prior Function Level of Independence: Independent         Comments: Pt reports she is an independent community ambulator without assistive device. No falls. Independent with ADLs/IADLs. Drives     Hand Dominance   Dominant Hand: Right    Extremity/Trunk Assessment   Upper Extremity Assessment Upper Extremity Assessment: Generalized weakness    Lower Extremity Assessment Lower Extremity Assessment: Generalized weakness       Communication   Communication: No difficulties  Cognition Arousal/Alertness: Awake/alert Behavior During Therapy: WFL for tasks assessed/performed Overall Cognitive Status: Within Functional Limits for tasks assessed                                        General Comments      Exercises     Assessment/Plan    PT Assessment Patient needs continued PT services  PT Problem List Decreased strength;Decreased activity tolerance;Decreased balance;Decreased mobility       PT Treatment Interventions DME instruction;Gait training;Stair training;Therapeutic activities;Functional mobility training;Therapeutic exercise;Balance training;Neuromuscular re-education;Patient/family education    PT Goals (Current goals can be found in the Care Plan section)  Acute Rehab PT Goals Patient Stated Goal: Return to prior function at home PT Goal Formulation: With patient Time For Goal Achievement: 01/10/17 Potential to Achieve Goals: Good    Frequency Min 2X/week   Barriers to discharge Decreased caregiver support Lives alone but good support from family    Co-evaluation               AM-PAC PT "6 Clicks" Daily Activity  Outcome Measure Difficulty turning over in bed (including adjusting bedclothes, sheets and blankets)?:  Unable Difficulty moving from lying on back to sitting on the side of the bed? : Unable Difficulty sitting down on and standing up from a chair with arms (e.g., wheelchair, bedside commode, etc,.)?: Unable Help needed moving to and from a bed to chair (including a wheelchair)?: A Little Help needed walking in hospital room?: A Lot Help needed climbing 3-5 steps with a railing? : Total 6 Click Score: 9    End of Session Equipment Utilized During Treatment: Gait belt Activity Tolerance: Patient tolerated treatment well Patient left: in chair;with call bell/phone within reach;with chair alarm set;with family/visitor present Nurse Communication: Mobility status PT Visit Diagnosis: Unsteadiness on feet (R26.81);Muscle weakness (generalized) (M62.81);Difficulty in walking, not elsewhere classified (R26.2)    Time: 9518-8416 PT Time Calculation (min) (ACUTE ONLY): 27 min   Charges:   PT Evaluation $PT Eval Moderate Complexity: 1 Mod     PT G Codes:   PT G-Codes **NOT FOR INPATIENT CLASS** Functional Assessment Tool Used: AM-PAC 6 Clicks Basic Mobility Functional Limitation: Mobility: Walking and moving around Mobility: Walking and Moving Around Current Status (S0630): At least 60 percent but less than 80 percent impaired, limited or restricted Mobility: Walking and Moving Around Goal Status [phone]): At least 20 percent but less than 40 percent impaired, limited or restricted    Phillips Grout PT, DPT    Zaid Tomes 12/27/2016, 3:01 PM

## 2016-12-27 NOTE — Clinical Social Work Placement (Signed)
   CLINICAL SOCIAL WORK PLACEMENT  NOTE  Date:  12/27/2016  Patient Details  Name: Ebony Navarro MRN: [id number] Date of Birth: Sep 04, 1933  Clinical Social Work is seeking post-discharge placement for this patient at the [place] level of care (*CSW will initial, date and re-position this form in  chart as items are completed):  Yes   Patient/family provided with [facility] Work Department's list of facilities offering this level of care within the geographic area requested by the patient (or if unable, by the patient's family).  Yes   Patient/family informed of their freedom to choose among providers that offer the needed level of care, that participate in Medicare, Medicaid or managed care program needed by the patient, have an available bed and are willing to accept the patient.  Yes   Patient/family informed of [facility]'s ownership interest in [facility] and [facility], as well as of the fact that they are under no obligation to receive care at these facilities.  PASRR submitted to EDS on 12/27/16     PASRR number received on 12/27/16     Existing PASRR number confirmed on       FL2 transmitted to all facilities in geographic area requested by pt/family on 12/27/16     FL2 transmitted to all facilities within larger geographic area on       Patient informed that his/her managed care company has contracts with or will negotiate with certain facilities, including the following:            Patient/family informed of bed offers received.  Patient chooses bed at       Physician recommends and patient chooses bed at      Patient to be transferred to   on  .  Patient to be transferred to facility by       Patient family notified on   of transfer.  Name of family member notified:        PHYSICIAN Please sign FL2, Please sign DNR     Additional Comment:    _______________________________________________ Ross Ludwig, LCSWA 12/27/2016, 6:01 PM

## 2016-12-27 NOTE — Progress Notes (Signed)
Placed on 3l after neb treatment

## 2016-12-27 NOTE — Progress Notes (Addendum)
Hematology/Oncology Progress Note [facility] [phone] [phone]  Patient Care Team: Adin Hector, MD as PCP - General (Internal Medicine) Clent Jacks, RN as Registered Nurse   Name of the patient: Ebony Navarro  [id number]  1933-09-01  Date of visit: 12/27/16   INTERVAL HISTORY- Patient was seen and examined at bedside. She reports having a good night sleep yesterday. Denies any chest pain or shortness of breath.  Review of systems- Review of Systems  Constitutional: Positive for malaise/fatigue. Negative for chills and fever.  HENT: Negative for hearing loss.   Eyes: Negative for blurred vision.  Respiratory: Positive for shortness of breath. Negative for cough.   Cardiovascular: Negative for leg swelling.  Gastrointestinal: Positive for nausea. Negative for heartburn.  Genitourinary: Negative for dysuria.  Musculoskeletal: Negative for myalgias.  Skin: Negative for rash.  Neurological: Positive for weakness. Negative for dizziness.  Endo/Heme/Allergies: Does not bruise/bleed easily.  Psychiatric/Behavioral: Negative for depression.    Allergies  Allergen Reactions  . Ace Inhibitors Other (See Comments)    hyperkalemia  . Atenolol Other (See Comments)    Worsening palpitations  . Iodine Other (See Comments)    11/20/16: per conversation with pt, pt with allergy to topical iodine and betadine.  Pt states she has had IV contrast in the past with now issues.  . Metoprolol Tartrate Other (See Comments)    intolerant  . Omeprazole Diarrhea  . Povidone-Iodine Other (See Comments)  . Rofecoxib Nausea Only  . Ciprofloxacin Rash  . Doxycycline Calcium Rash  . Nitrofurantoin Rash  . Quinolones Rash  . Sulfa Antibiotics Rash    Patient Active Problem List   Diagnosis Date Noted  . Thrombocytopenia ([place])   . Acute respiratory failure with hypoxemia ([place])   . Shortness of breath   . NSTEMI (non-ST elevated  myocardial infarction) ([place]) 12/19/2016  . Ovarian cancer ([place]) 11/24/2016  . Malignant neoplasm of ovary ([place]) 11/24/2016  . Abdominal pain 11/20/2016  . Ascites 11/20/2016  . Ovarian mass 11/20/2016  . Absolute anemia 01/22/2015  . Anxiety 01/22/2015  . Malignant neoplasm of breast ([place]) 01/22/2015  . Clinical depression 01/22/2015  . Gastric catarrh 01/22/2015  . Blood glucose elevated 01/22/2015  . HLD (hyperlipidemia) 01/22/2015  . BP (high blood pressure) 01/22/2015  . Acquired atrophy of thyroid 04/02/2014  . Frequent UTI 11/23/2013  . Chronic kidney disease (CKD), stage III (moderate) ([place]) 10/02/2013     Past Medical History:  Diagnosis Date  . Breast cancer ([place]) 2005   Right, radiation and lumpectomy  . Breast cancer ([place]) 2002   Left, Chemo, radiation and lumpectomy  . Cancer ([place])   . Hyperlipidemia   . Hypertension   . Ovarian cancer ([place]) 11/24/2016     Past Surgical History:  Procedure Laterality Date  . BREAST BIOPSY Left 2010   negative stereotactic biopsy  . BREAST LUMPECTOMY Right 2005   positive  . BREAST LUMPECTOMY Left 2002   positive  . PORTA CATH INSERTION N/A 12/09/2016   Performed by Algernon Huxley, MD at [facility]    Social History   Socioeconomic History  . Marital status: Widowed    Spouse name: Not on file  . Number of children: Not on file  . Years of education: Not on file  . Highest education level: Not on file  Social Needs  . Financial resource strain: Not on file  . Food insecurity - worry: Not on file  .  Food insecurity - inability: Not on file  . Transportation needs - medical: Not on file  . Transportation needs - non-medical: Not on file  Occupational History  . Occupation: retired  Tobacco Use  . Smoking status: Never Smoker  . Smokeless tobacco: Never Used  Substance and Sexual Activity  . Alcohol use: No  . Drug use: No  . Sexual activity: No    Birth control/protection: Post-menopausal  Other  Topics Concern  . Not on file  Social History Narrative  . Not on file     Family History  Problem Relation Age of Onset  . Hypertension Mother   . Hypertension Father      Current Facility-Administered Medications:  .  0.9 %  sodium chloride infusion, 250 mL, Intravenous, PRN, Pyreddy, Pavan, MD .  acetaminophen (TYLENOL) tablet 650 mg, 650 mg, Oral, Q4H PRN, Loletha Grayer, MD, 650 mg at 12/24/16 2358 .  ALPRAZolam Duanne Moron) tablet 0.25 mg, 0.25 mg, Oral, BID PRN, Awilda Bill, NP, 0.25 mg at 12/26/16 1934 .  amiodarone (PACERONE) tablet 200 mg, 200 mg, Oral, BID, Clabe Seal, PA-C, 200 mg at 12/27/16 1030 .  aspirin EC tablet 81 mg, 81 mg, Oral, Daily, Leslye Peer, Richard, MD, 81 mg at 12/27/16 1031 .  atorvastatin (LIPITOR) tablet 40 mg, 40 mg, Oral, Daily, Pyreddy, Pavan, MD, 40 mg at 12/27/16 1030 .  budesonide (PULMICORT) nebulizer solution 0.5 mg, 0.5 mg, Nebulization, BID, Leslye Peer, Richard, MD, 0.5 mg at 12/27/16 0816 .  digoxin (LANOXIN) tablet 0.125 mg, 0.125 mg, Oral, Daily, Dustin Flock, MD, 0.125 mg at 12/27/16 1030 .  feeding supplement (ENSURE ENLIVE) (ENSURE ENLIVE) liquid 237 mL, 237 mL, Oral, BID BM, Leslye Peer, Richard, MD, 237 mL at 12/27/16 1358 .  furosemide (LASIX) injection 20 mg, 20 mg, Intravenous, Q12H, Dustin Flock, MD, 20 mg at 12/27/16 0459 .  insulin aspart (novoLOG) injection 0-9 Units, 0-9 Units, Subcutaneous, TID WC, Patel, Shreyang, MD .  ipratropium-albuterol (DUONEB) 0.5-2.5 (3) MG/3ML nebulizer solution 3 mL, 3 mL, Nebulization, Q4H PRN, Wilhelmina Mcardle, MD, 3 mL at 12/27/16 0816 .  levothyroxine (SYNTHROID, LEVOTHROID) tablet 50 mcg, 50 mcg, Oral, QAC breakfast, Pyreddy, Pavan, MD, 50 mcg at 12/27/16 0911 .  metoprolol tartrate (LOPRESSOR) injection 5 mg, 5 mg, Intravenous, Q1H PRN, Leslye Peer, Richard, MD .  morphine 2 MG/ML injection 2 mg, 2 mg, Intravenous, Q4H PRN, Pyreddy, Pavan, MD, 2 mg at 12/27/16 0525 .  multivitamin with minerals tablet  1 tablet, 1 tablet, Oral, Daily, Leslye Peer, Richard, MD, 1 tablet at 12/27/16 1030 .  nitroGLYCERIN (NITROSTAT) SL tablet 0.4 mg, 0.4 mg, Sublingual, Q5 Min x 3 PRN, Pyreddy, Pavan, MD .  ondansetron (ZOFRAN) injection 4 mg, 4 mg, Intravenous, Q6H PRN, Pyreddy, Pavan, MD, 4 mg at 12/22/16 1856 .  pantoprazole (PROTONIX) EC tablet 40 mg, 40 mg, Oral, Q1200, Wilhelmina Mcardle, MD, 40 mg at 12/27/16 1130 .  sodium chloride flush (NS) 0.9 % injection 3 mL, 3 mL, Intravenous, Q12H, Pyreddy, Pavan, MD, 3 mL at 12/27/16 1031 .  sodium chloride flush (NS) 0.9 % injection 3 mL, 3 mL, Intravenous, PRN, Pyreddy, Pavan, MD, 3 mL at 12/27/16 0526   Physical exam:  Vitals:   12/26/16 1802 12/26/16 2125 12/27/16 0414 12/27/16 1043  BP:  110/62 105/61 (!) 107/58  Pulse: (!) 105 (!) 106 (!) 102 (!) 102  Resp:  18 18 18   Temp:  98.6 F (37 C) 98.6 F (37 C) 97.8 F (36.6 C)  TempSrc:  Axillary Axillary Oral  SpO2: 98% 97% 95% 96%  Weight:   163 lb 6.4 oz (74.1 kg)   Height:           CMP Latest Ref Rng & Units 12/27/2016  Glucose 65 - 99 mg/dL 200(H)  BUN 6 - 20 mg/dL 79(H)  Creatinine 0.44 - 1.00 mg/dL 1.46(H)  Sodium 135 - 145 mmol/L 130(L)  Potassium 3.5 - 5.1 mmol/L 4.2  Chloride 101 - 111 mmol/L 95(L)  CO2 22 - 32 mmol/L 26  Calcium 8.9 - 10.3 mg/dL 8.4(L)  Total Protein 6.5 - 8.1 g/dL -  Total Bilirubin 0.3 - 1.2 mg/dL -  Alkaline Phos 38 - 126 U/L -  AST 15 - 41 U/L -  ALT 14 - 54 U/L -   CBC Latest Ref Rng & Units 12/27/2016  WBC 3.6 - 11.0 K/uL 3.2(L)  Hemoglobin 12.0 - 16.0 g/dL 10.3(L)  Hematocrit 35.0 - 47.0 % 31.2(L)  Platelets 150 - 440 K/uL 78(L)     Dg Chest 2 View  Result Date: 12/19/2016 CLINICAL DATA:  Central chest pain and shortness of breath yesterday. History of ovarian cancer, breast cancer, on chemotherapy. EXAM: CHEST  2 VIEW COMPARISON:  CT chest November 20, 2016 FINDINGS: The cardiac silhouette is mildly enlarged and unchanged. Calcified aortic knob. Small  pleural effusions. No focal consolidation. RIGHT single-lumen chest Port-A-Cath distal tip projects in mid superior vena cava. No pneumothorax. Soft tissue planes and included osseous structures are nonsuspicious. Calcifications in the neck are likely vascular. Osteopenia. IMPRESSION: Mild cardiomegaly.  Small pleural effusions. Aortic Atherosclerosis (ICD10-I70.0). Electronically Signed   By: Elon Alas M.D.   On: 12/19/2016 02:00   Ct Angio Chest Pe W/cm &/or Wo Cm  Result Date: 12/19/2016 CLINICAL DATA:  Acute onset of central chest pain and shortness of breath. Current history of ovarian cancer, status post recent chemotherapy. EXAM: CT ANGIOGRAPHY CHEST WITH CONTRAST TECHNIQUE: Multidetector CT imaging of the chest was performed using the standard protocol during bolus administration of intravenous contrast. Multiplanar CT image reconstructions and MIPs were obtained to evaluate the vascular anatomy. CONTRAST:  3mL ISOVUE-370 IOPAMIDOL (ISOVUE-370) INJECTION 76% COMPARISON:  Chest radiograph performed earlier today at 1:41 a.m., and CTA of the chest performed 11/20/2016 FINDINGS: Cardiovascular:  There is no evidence of pulmonary embolus. The heart is borderline normal in size. Diffuse coronary artery calcifications are seen. Calcification is noted at the aortic valve. Scattered calcification is seen along the aortic arch and descending thoracic aorta, and at the great vessels. Borderline prominence of the ascending thoracic aorta is thought to be within normal limits given the patient's age. Mediastinum/Nodes: An enlarged subcarinal node is suggested, measuring 2.0 cm in short axis. Previously noted esophageal wall thickening has largely resolved. A small residual hiatal hernia is seen. No pericardial effusion is identified. The thyroid gland is grossly unremarkable. No axillary lymphadenopathy is seen. A right-sided chest port is noted. Lungs/Pleura: Small bilateral pleural effusions are noted,  with associated bibasilar atelectasis. No pneumothorax is seen. No definite mass is identified. Upper Abdomen: The visualized portions of the liver and the spleen are unremarkable. Small stones are noted dependently within the gallbladder. The visualized portions of the adrenal glands and kidneys are grossly unremarkable. There is a 3.4 cm cystic mass at the tail of the pancreas, as noted on prior CT. Musculoskeletal: No acute osseous abnormalities are identified. Multilevel endplate sclerotic change is noted along the thoracic and upper lumbar spine, with vacuum phenomenon. The visualized musculature is  unremarkable in appearance. Review of the MIP images confirms the above findings. IMPRESSION: 1. No evidence of pulmonary embolus. 2. Small bilateral pleural effusions, with bibasilar atelectasis. 3. Diffuse coronary artery calcifications. 4. Enlarged subcarinal node, measuring 2.0 cm in short axis, may reflect the patient's malignancy or may be chronic in nature, though mildly more prominent than on the recent prior CTA. 5. Small residual hiatal hernia noted. 6. Cholelithiasis.  Gallbladder otherwise unremarkable. 7. 3.4 cm cystic mass again noted at the tail of the pancreas; this is relatively stable in appearance. Electronically Signed   By: Garald Balding M.D.   On: 12/19/2016 04:20   Dg Chest Port 1 View  Result Date: 12/26/2016 CLINICAL DATA:  Shortness of Breath EXAM: PORTABLE CHEST 1 VIEW COMPARISON:  12/23/2016 FINDINGS: Cardiac shadow is stable. Persistent right upper lobe infiltrative changes are seen. Increasing central vascular congestion and interstitial changes noted. Likely small effusions are present as well. IMPRESSION: Slight increase in the degree of CHF. Patchy infiltrative changes in the right upper lobe are again noted. Electronically Signed   By: Inez Catalina M.D.   On: 12/26/2016 16:26   Dg Chest Port 1 View  Result Date: 12/23/2016 CLINICAL DATA:  Respiratory failure. EXAM:  PORTABLE CHEST 1 VIEW COMPARISON:  12/22/2016 . FINDINGS: PowerPort catheter noted with tip over the superior vena cava . Cardiomegaly with bilateral from interstitial prominence and bilateral pleural effusions consistent with CHF. Bilateral pneumonitis cannot be excluded. No pneumothorax. IMPRESSION: 1. PowerPort catheter noted with tip noted over the superior vena cava . 2. Cardiomegaly with mild pulmonary interstitial prominence and bilateral pleural effusions consistent CHF. Bilateral pneumonitis cannot be excluded. Electronically Signed   By: Marcello Moores  Register   On: 12/23/2016 09:53   Dg Chest Port 1 View  Result Date: 12/22/2016 CLINICAL DATA:  Respiratory failure EXAM: PORTABLE CHEST 1 VIEW COMPARISON:  December 20, 2016 FINDINGS: Port-A-Cath tip is in the superior vena cava. No pneumothorax. There are small pleural effusions bilaterally. There is no edema or consolidation. There is cardiomegaly with pulmonary venous hypertension. No adenopathy. There is aortic atherosclerosis. There is evidence of old trauma involving the lateral left clavicle, stable. IMPRESSION: Pulmonary vascular congestion with small pleural effusions bilaterally. No edema or consolidation evident. There is aortic atherosclerosis. No pneumothorax. Aortic Atherosclerosis (ICD10-I70.0). Electronically Signed   By: Lowella Grip III M.D.   On: 12/22/2016 07:07   Dg Chest Port 1 View  Result Date: 12/20/2016 CLINICAL DATA:  83 year old female undergoing chemotherapy for ovarian cancer. Treatment last week with subsequent shortness of breath. EXAM: PORTABLE CHEST 1 VIEW COMPARISON:  Chest CTA 12/19/2016 and earlier. FINDINGS: Portable AP upright view at 1424 hours. Stable right IJ approach chest porta cath, currently accessed. Increased radiographic opacity at both lung bases in part compatible with the pleural effusions demonstrated by CTA yesterday. Left greater than right lower lobe opacity increase. Increased patchy right  perihilar opacity which partially overlaps the right Port-A-Cath. No pneumothorax. Stable cardiac size and mediastinal contours. No pneumothorax. Stable pulmonary vascularity. IMPRESSION: Increased bibasilar and right suprahilar pulmonary opacity since the CTA yesterday appears to reflect a combination of airspace disease and small pleural effusions. Consider acute multifocal infection versus aspiration. Electronically Signed   By: Genevie Ann M.D.   On: 12/20/2016 14:33    Assessment and plan-  Patient is a 83 y.o. female with remote history of breast cancer, recently diagnosed with stage III ovarian cancer being treated with curative intent status,  post 2 cycles of carboplatin +  Taxol+ the Avastin, planned debulking surgery after 4 cycles of chemotherapy treatment, presented with shortness of breath and chest discomfort.  # Stage III ovarian cancer currently on chemotherapy treatment with curative intent:  Clinically good response. Will discuss with patient outpatient and reevaluate if she is able to have additional chemotherapy.  # Neutropenia: Differential was not checked today. WBC has trended up. We'll check CBC with differential tomorrow, anticipate that her [place] should have recovered. # Thrombocytopenia:  likely chemotherapy induced. No active bleeding. Continue monitor. Continue Aspirin. Trending up # NSTEMI/Afib/cardiogenic hypotension: Deferred management per cardiology management.     # CODE STATUS:DNR/DNI.    Will continue follow along her inpatient course.  Total face to face encounter time for this patient visit was 25 min. >50% of the time was  spent in counseling and coordination of care.    Earlie Server, MD, PhD Hematology Oncology [facility] at [facility] Pager- [phone] 12/27/2016

## 2016-12-27 NOTE — Plan of Care (Signed)
  Clinical Measurements: Ability to maintain clinical measurements within normal limits will improve 12/27/2016 1449 - Not Progressing by Daron Offer, RN Note Sodium level = only 130 today. Will continue to monitor. Wenda Low [facility]

## 2016-12-28 ENCOUNTER — Other Ambulatory Visit: Payer: Self-pay

## 2016-12-28 DIAGNOSIS — Z7189 Other specified counseling: Secondary | ICD-10-CM

## 2016-12-28 DIAGNOSIS — R0602 Shortness of breath: Secondary | ICD-10-CM

## 2016-12-28 LAB — CBC WITH DIFFERENTIAL/PLATELET
BAND NEUTROPHILS: 0 %
BASOS ABS: 0.1 10*3/uL (ref 0–0.1)
BASOS PCT: 2 %
BLASTS: 0 %
EOS ABS: 0.1 10*3/uL (ref 0–0.7)
Eosinophils Relative: 1 %
HCT: 32.4 % — ABNORMAL LOW (ref 35.0–47.0)
Hemoglobin: 11 g/dL — ABNORMAL LOW (ref 12.0–16.0)
LYMPHS PCT: 18 %
Lymphs Abs: 1.2 10*3/uL (ref 1.0–3.6)
MCH: 31.2 pg (ref 26.0–34.0)
MCHC: 33.9 g/dL (ref 32.0–36.0)
MCV: 92 fL (ref 80.0–100.0)
METAMYELOCYTES PCT: 0 %
MONO ABS: 0.6 10*3/uL (ref 0.2–0.9)
MYELOCYTES: 0 %
Monocytes Relative: 9 %
Neutro Abs: 4.7 10*3/uL (ref 1.4–6.5)
Neutrophils Relative %: 70 %
Other: 0 %
PLATELETS: 74 10*3/uL — AB (ref 150–440)
PROMYELOCYTES ABS: 0 %
RBC: 3.52 MIL/uL — ABNORMAL LOW (ref 3.80–5.20)
RDW: 16.5 % — AB (ref 11.5–14.5)
WBC: 6.7 10*3/uL (ref 3.6–11.0)
nRBC: 10 /100 WBC — ABNORMAL HIGH

## 2016-12-28 LAB — HEMOGLOBIN A1C
HEMOGLOBIN A1C: 7.2 % — AB (ref 4.8–5.6)
MEAN PLASMA GLUCOSE: 159.94 mg/dL

## 2016-12-28 LAB — GLUCOSE, CAPILLARY
GLUCOSE-CAPILLARY: 114 mg/dL — AB (ref 65–99)
GLUCOSE-CAPILLARY: 117 mg/dL — AB (ref 65–99)
Glucose-Capillary: 132 mg/dL — ABNORMAL HIGH (ref 65–99)
Glucose-Capillary: 159 mg/dL — ABNORMAL HIGH (ref 65–99)

## 2016-12-28 LAB — PATHOLOGIST SMEAR REVIEW

## 2016-12-28 MED ORDER — MIDODRINE HCL 5 MG PO TABS
5.0000 mg | ORAL_TABLET | Freq: Three times a day (TID) | ORAL | Status: DC
  Administered 2016-12-28 – 2017-01-01 (×13): 5 mg via ORAL
  Filled 2016-12-28 (×14): qty 1

## 2016-12-28 MED ORDER — FUROSEMIDE 10 MG/ML IJ SOLN
40.0000 mg | Freq: Two times a day (BID) | INTRAMUSCULAR | Status: DC
  Administered 2016-12-28 – 2016-12-30 (×4): 40 mg via INTRAVENOUS
  Filled 2016-12-28 (×4): qty 4

## 2016-12-28 MED ORDER — MELATONIN 5 MG PO TABS
5.0000 mg | ORAL_TABLET | Freq: Every day | ORAL | Status: DC
  Administered 2016-12-28 – 2016-12-31 (×4): 5 mg via ORAL
  Filled 2016-12-28 (×5): qty 1

## 2016-12-28 NOTE — Consult Note (Signed)
Consultation Note Date: 12/28/2016   Patient Name: Ebony Navarro  DOB: [birth year]  MRN: [id number]  Age / Sex: 83 y.o., female  PCP: Adin Hector, MD Referring Physician: Dustin Flock, MD  Reason for Consultation: Establishing goals of care  HPI/Patient Profile: Ebony Navarro  is a 83 y.o. female with a known history of breast cancer, ovarian cancer, hyperlipidemia, hypertension presented to the emergency room with shortness of breath and chest pressure. She was noted to have an elevated troponin and started on IV heparin drip for anticoagulation that was discontinued due to GIB. She was admitted to ICU and transitioned to a floor bed.    Clinical Assessment and Goals of Care:  Spoke with [name]. Her voice is almost inaudible. She denies sore throat. When discussing her current care, she states "I'm tired of it." She then states "I'm not ready to stop anything".  She then said " talk to my daughter".  Spoke with [name]. She states she and her brother are [name]'s only children, and states her mother is widowed. She states until Saturday she was very active and independent. [name] states she goes to the [facility] daily, drives, exercises, plays cards, socializes, grocery shops, gets her own gas.   [name] states [name] has had 2 rounds out of 6 of chemo treatments, and has been doing very well with no trouble. She states a day or 2 after the second round she had a heart attack, and has had a lot of difficulties since admission. She does state her mother is eating better, but just feels bad and hasn't slept well.  Kelly states BiPAP has helped, but her mother does like BiPAP or SCDs. .   We discussed diagnosis, prognosis, GOC, and options.  Concepts of Hospice and Palliative Care were discussed.  [name] confirms her mother's statement that Ms. Eye wants to  continue aggressive treatment to try to get better, and that the only thing she does not want is resuscitative efforts if her heart were to stop. She states her mother would want to be placed on a ventilator to give her a chance to get better if it were needed. She wants [facility] for rehab. She would be okay with palliative care following outpatient.   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      SUMMARY OF RECOMMENDATIONS   Continue current care.  DNR status.   Code Status/Advance Care Planning:  DNR in place.    Palliative Prophylaxis:   Aspiration   Prognosis:   Unable to determine depending on cardiac and respiratory status.   Discharge Planning: [place] for rehab with Palliative care service follow-up      Primary Diagnoses: Present on Admission: **None**   I have reviewed the medical record, interviewed the patient and family, and examined the patient. The following aspects are pertinent.  Past Medical History:  Diagnosis Date  . Breast cancer ([place]) 2005   Right, radiation and lumpectomy  .  Breast cancer ([place]) 2002   Left, Chemo, radiation and lumpectomy  . Cancer ([place])   . Hyperlipidemia   . Hypertension   . Ovarian cancer ([place]) 11/24/2016   Social History   Socioeconomic History  . Marital status: Widowed    Spouse name: None  . Number of children: None  . Years of education: None  . Highest education level: None  Social Needs  . Financial resource strain: None  . Food insecurity - worry: None  . Food insecurity - inability: None  . Transportation needs - medical: None  . Transportation needs - non-medical: None  Occupational History  . Occupation: retired  Tobacco Use  . Smoking status: Never Smoker  . Smokeless tobacco: Never Used  Substance and Sexual Activity  . Alcohol use: No  . Drug use: No  . Sexual activity: No    Birth control/protection: Post-menopausal  Other Topics Concern   . None  Social History Narrative  . None   Family History  Problem Relation Age of Onset  . Hypertension Mother   . Hypertension Father    Scheduled Meds: . amiodarone  200 mg Oral BID  . aspirin EC  81 mg Oral Daily  . atorvastatin  40 mg Oral Daily  . budesonide (PULMICORT) nebulizer solution  0.5 mg Nebulization BID  . digoxin  0.125 mg Oral Daily  . feeding supplement (ENSURE ENLIVE)  237 mL Oral BID BM  . furosemide  40 mg Intravenous Q12H  . insulin aspart  0-9 Units Subcutaneous TID WC  . levothyroxine  50 mcg Oral QAC breakfast  . midodrine  5 mg Oral TID WC  . multivitamin with minerals  1 tablet Oral Daily  . pantoprazole  40 mg Oral Q1200  . sodium chloride flush  3 mL Intravenous Q12H   Continuous Infusions: . sodium chloride     PRN Meds:.sodium chloride, acetaminophen, ALPRAZolam, hydrOXYzine, ipratropium-albuterol, metoprolol tartrate, morphine injection, nitroGLYCERIN, ondansetron (ZOFRAN) IV, sodium chloride flush Medications Prior to Admission:  Prior to Admission medications   Medication Sig Start Date End Date Taking? Authorizing Provider  atorvastatin (LIPITOR) 40 MG tablet Take 40 mg by mouth daily.    Yes [provider]  dexamethasone (DECADRON) 4 MG tablet Take 8 mg daily by mouth. Start the day after chemotherapy for 2 days   Yes [provider]  diltiazem (CARDIZEM CD) 120 MG 24 hr capsule TAKE 120 MG BY MOUTH EVERY OTHER DAY 10/09/14  Yes [provider]  levothyroxine (SYNTHROID, LEVOTHROID) 50 MCG tablet TAKE 1 TABLET BY MOUTH DAILY ON AN EMPTY STOMACH WITH A GLASS OF WATER 30-60 MIN BEFORE BREAKFAST 11/11/14  Yes [provider]  lidocaine-prilocaine (EMLA) cream Apply to affected area once 11/26/16  Yes Earlie Server, MD  LORazepam (ATIVAN) 0.5 MG tablet Take 1 tablet (0.5 mg total) by mouth every 6 (six) hours as needed (Nausea or vomiting). 11/26/16  Yes Earlie Server, MD  megestrol (MEGACE) 20 MG tablet Take 1 tablet  (20 mg total) by mouth daily. 11/30/16  Yes Burns, Wandra Feinstein, NP  ondansetron (ZOFRAN) 8 MG tablet Take 1 tablet (8 mg total) by mouth 2 (two) times daily as needed for refractory nausea / vomiting. Start on day 3 after chemo. 11/26/16  Yes Earlie Server, MD  prochlorperazine (COMPAZINE) 10 MG tablet Take 1 tablet (10 mg total) by mouth every 6 (six) hours as needed (Nausea or vomiting). 11/26/16  Yes Earlie Server, MD  promethazine (PHENERGAN) 25 MG tablet Take 1  tablet (25 mg total) by mouth every 6 (six) hours as needed for nausea or vomiting. 11/30/16  Yes Jacquelin Hawking, NP   Allergies  Allergen Reactions  . Ace Inhibitors Other (See Comments)    hyperkalemia  . Atenolol Other (See Comments)    Worsening palpitations  . Iodine Other (See Comments)    11/20/16: per conversation with pt, pt with allergy to topical iodine and betadine.  Pt states she has had IV contrast in the past with now issues.  . Metoprolol Tartrate Other (See Comments)    intolerant  . Omeprazole Diarrhea  . Povidone-Iodine Other (See Comments)  . Rofecoxib Nausea Only  . Ciprofloxacin Rash  . Doxycycline Calcium Rash  . Nitrofurantoin Rash  . Quinolones Rash  . Sulfa Antibiotics Rash   Review of Systems  HENT: Positive for voice change.     Physical Exam  Constitutional: No distress.  HENT:  Head: Normocephalic.  Eyes: EOM are normal.  Pulmonary/Chest: Effort normal.    Vital Signs: BP (!) 100/57   Pulse 92   Temp (!) 97.3 F (36.3 C) (Oral)   Resp 18   Ht 5\' 7"  (1.702 m)   Wt 74.7 kg (164 lb 11.2 oz)   SpO2 98%   BMI 25.80 kg/m  Pain Assessment: 0-10 POSS *See Group Information*: 1-Acceptable,Awake and alert Pain Score: Asleep   SpO2: SpO2: 98 % O2 Device:SpO2: 98 % O2 Flow Rate: .O2 Flow Rate (L/min): 5 L/min  IO: Intake/output summary:   Intake/Output Summary (Last 24 hours) at 12/28/2016 1539 Last data filed at 12/28/2016 0900 Gross per 24 hour  Intake -  Output 1650 ml  Net -1650  ml    LBM: Last BM Date: 12/25/16 Baseline Weight: Weight: 72.6 kg (160 lb) Most recent weight: Weight: 74.7 kg (164 lb 11.2 oz)     Palliative Assessment/Data: 50%     Time In: 3:10 Time Out: 4:20 Time Total: 70 min Greater than 50%  of this time was spent counseling and coordinating care related to the above assessment and plan.  Signed by: Asencion Gowda, NP 12/28/2016 4:27 PM Office: [phone]) [phone] 7am-7pm  Please see Amion for pager number and availability  Call primary team after hours   Please contact Palliative Medicine Team phone at [phone] for questions and concerns.  For individual provider: See Shea Evans

## 2016-12-28 NOTE — Progress Notes (Signed)
Physical Therapy Treatment Patient Details Name: Ebony Navarro MRN: [id number] DOB: 10-26-1933 Today's Date: 12/28/2016    History of Present Illness Ebony Navarro is a 83 y.o. female with a known history of breast cancer, ovarian cancer, hyperlipidemia, hypertension presented to the emergency room with shortness of breath and chest pressure. Shortness of breath started last night. Patient not on home oxygen. She felt short of breath and had chest discomfort, which was aching in nature. Patient was worked up with CT chest which showed no pulmonary embolism, small bilateral pleural effusions were noted. During the workup in the emergency room troponin was elevated. Patient was started on IV heparin drip for anticoagulation and was started on cardiac medications. Hospitalist service was consulted for further care. Pt is now admitted for afib with RVR, acute hypoxic respiratory failure, aspirative PNA, acute CHF, upper GIB, and NSTEMI. Heparin drip discontinued due to upper GIB.    PT Comments    Pt reports feeling unwell and SOB so requests bed exercises only today. Pt is able to complete all bed exercises as instructed with great motivation. Will continue to progress further mobility at follow-up treatment sessions. She will need SNF placement at discharge. Pt will benefit from PT services to address deficits in strength, balance, and mobility in order to return to full function at home.       Follow Up Recommendations  SNF     Equipment Recommendations  Other (comment)(TBD at next venue. If discharge home will need RW)    Recommendations for Other Services       Precautions / Restrictions Precautions Precautions: Fall Restrictions Weight Bearing Restrictions: No    Mobility  Bed Mobility               General bed mobility comments: Pt defers bed mobility, transfers, or ambulation at this time due to feeling unwell.   Transfers                     Ambulation/Gait                 Stairs            Wheelchair Mobility    Modified Rankin (Stroke Patients Only)       Balance                                            Cognition Arousal/Alertness: Lethargic Behavior During Therapy: WFL for tasks assessed/performed Overall Cognitive Status: Within Functional Limits for tasks assessed                                        Exercises General Exercises - Lower Extremity Ankle Circles/Pumps: Both;Strengthening;15 reps;Supine Quad Sets: Strengthening;Both;15 reps;Supine Gluteal Sets: Strengthening;Both;15 reps;Supine Short Arc Quad: Strengthening;Both;15 reps;Supine Heel Slides: Strengthening;Both;15 reps;Supine Hip ABduction/ADduction: Strengthening;Both;15 reps;Supine Straight Leg Raises: Strengthening;Both;15 reps;Supine    General Comments        Pertinent Vitals/Pain Pain Assessment: No/denies pain    Home Living                      Prior Function            PT Goals (current goals can now be found in the care plan section) Acute Rehab PT Goals  Patient Stated Goal: Return to prior function at home PT Goal Formulation: With patient Time For Goal Achievement: 01/10/17 Potential to Achieve Goals: Good Progress towards PT goals: Progressing toward goals    Frequency    Min 2X/week      PT Plan Current plan remains appropriate    Co-evaluation              AM-PAC PT "6 Clicks" Daily Activity  Outcome Measure  Difficulty turning over in bed (including adjusting bedclothes, sheets and blankets)?: Unable Difficulty moving from lying on back to sitting on the side of the bed? : Unable Difficulty sitting down on and standing up from a chair with arms (e.g., wheelchair, bedside commode, etc,.)?: Unable Help needed moving to and from a bed to chair (including a wheelchair)?: A Little Help needed walking in hospital room?: A Lot Help needed  climbing 3-5 steps with a railing? : Total 6 Click Score: 9    End of Session Equipment Utilized During Treatment: Gait belt Activity Tolerance: Patient tolerated treatment well Patient left: in bed;with call bell/phone within reach;with bed alarm set;with family/visitor present Nurse Communication: Mobility status PT Visit Diagnosis: Unsteadiness on feet (R26.81);Muscle weakness (generalized) (M62.81);Difficulty in walking, not elsewhere classified (R26.2)     Time: 1027-2536 PT Time Calculation (min) (ACUTE ONLY): 12 min  Charges:  $Therapeutic Exercise: 8-22 mins                    G Codes:       Lyndel Safe Duron Meister PT, DPT     Monque Haggar 12/28/2016, 5:26 PM

## 2016-12-28 NOTE — Clinical Social Work Note (Signed)
CSW presented bed offers to patient's son.  Patient's son will review options and discuss with his sister.  They will then contact CSW with decision on where they would like patient to go to SNF for short term rehab.  Jones Broom. [place], MSW, [place]  12/28/2016 6:05 PM

## 2016-12-28 NOTE — Progress Notes (Signed)
Pt BP was at 99/67 MD notified. MD states its okay to give Amiodarone. Will continue to monitor

## 2016-12-28 NOTE — Progress Notes (Signed)
Patient ID: Ebony Navarro, female   DOB: 05-21-1933, 83 y.o.   MRN: [id number]  [facility] PROGRESS NOTE  Ebony Navarro [id number]:[id number] DOB: 02/09/1934 DOA: 12/19/2016 PCP: Adin Hector, MD  HPI/Subjective: Patient continues to be short of breath.   Objective: Vitals:   12/28/16 1127 12/28/16 1259  BP: 99/72 (!) 100/57  Pulse: (!) 109 92  Resp:    Temp:    SpO2: 100% 98%    Filed Weights   12/26/16 0412 12/27/16 0414 12/28/16 0241  Weight: 161 lb 11.2 oz (73.3 kg) 163 lb 6.4 oz (74.1 kg) 164 lb 11.2 oz (74.7 kg)    ROS: Review of Systems  Constitutional: Negative for chills and fever.  HENT: Positive for hearing loss.   Eyes: Negative for blurred vision.  Respiratory: Positive for cough and shortness of breath.   Cardiovascular: Negative for chest pain.  Gastrointestinal: Negative for abdominal pain, constipation, diarrhea, nausea and vomiting.  Genitourinary: Negative for dysuria.  Musculoskeletal: Negative for joint pain.  Neurological: Negative for dizziness and headaches.   Exam: Physical Exam  Constitutional: She is oriented to person, place, and time. She appears well-developed.  HENT:  Head: Normocephalic.  Nose: No mucosal edema.  Mouth/Throat: No oropharyngeal exudate or posterior oropharyngeal edema.  Eyes: Conjunctivae, EOM and lids are normal. Pupils are equal, round, and reactive to light.  Neck: Neck supple. No JVD present. Carotid bruit is not present. No edema present. No thyroid mass and no thyromegaly present.  Cardiovascular: Normal rate, S1 normal and S2 normal. Exam reveals no gallop.  Murmur heard. Pulses:      Dorsalis pedis pulses are 2+ on the right side, and 2+ on the left side.  Respiratory: No respiratory distress. She has decreased breath sounds in the right middle field, the right lower field, the left middle field and the left lower field. She has wheezes in the left middle field. She has rhonchi in the right lower  field and the left lower field. She has no rales.  GI: Soft. Bowel sounds are normal. She exhibits no distension. There is no tenderness.  Musculoskeletal:       Right ankle: She exhibits no swelling.       Left ankle: She exhibits no swelling.  Lymphadenopathy:    She has no cervical adenopathy.  Neurological: She is alert and oriented to person, place, and time. No cranial nerve deficit.  Skin: Skin is warm. No rash noted. Nails show no clubbing.  Psychiatric: She has a normal mood and affect.      Data Reviewed: Basic Metabolic Panel: Recent Labs  Lab 12/22/16 0524 12/24/16 0512 12/25/16 0446 12/26/16 0456 12/27/16 0505  NA 130* 134* 135 135 130*  K 4.2 3.6 3.4* 4.3 4.2  CL 95* 99* 101 100* 95*  CO2 20* 24 25 26 26   GLUCOSE 168* 133* 104* 162* 200*  BUN 70* 92* 82* 77* 79*  CREATININE 2.00* 2.09* 1.62* 1.56* 1.46*  CALCIUM 7.9* 8.2* 8.1* 8.5* 8.4*  MG  --  2.2  --   --   --    Liver Function Tests: No results for input(s): AST, ALT, ALKPHOS, BILITOT, PROT, ALBUMIN in the last 168 hours. CBC: Recent Labs  Lab 12/24/16 0512 12/25/16 0446 12/26/16 0456 12/27/16 0505 12/28/16 0426  WBC 0.8* 0.4* 1.0* 3.2* 6.7  NEUTROABS 0.4*  --   --   --  4.7  HGB 9.4* 9.5* 10.0* 10.3* 11.0*  HCT 29.1* 28.7* 30.7* 31.2* 32.4*  MCV 92.1 91.5 92.8 92.6 92.0  PLT 55* 61* 81* 78* 74*   Cardiac Enzymes: Recent Labs  Lab 12/22/16 1721  TROPONINI 4.18*     Studies: Dg Chest Port 1 View  Result Date: 12/26/2016 CLINICAL DATA:  Shortness of Breath EXAM: PORTABLE CHEST 1 VIEW COMPARISON:  12/23/2016 FINDINGS: Cardiac shadow is stable. Persistent right upper lobe infiltrative changes are seen. Increasing central vascular congestion and interstitial changes noted. Likely small effusions are present as well. IMPRESSION: Slight increase in the degree of CHF. Patchy infiltrative changes in the right upper lobe are again noted. Electronically Signed   By: Inez Catalina M.D.   On:  12/26/2016 16:26    Scheduled Meds: . amiodarone  200 mg Oral BID  . aspirin EC  81 mg Oral Daily  . atorvastatin  40 mg Oral Daily  . budesonide (PULMICORT) nebulizer solution  0.5 mg Nebulization BID  . digoxin  0.125 mg Oral Daily  . feeding supplement (ENSURE ENLIVE)  237 mL Oral BID BM  . furosemide  40 mg Intravenous Q12H  . insulin aspart  0-9 Units Subcutaneous TID WC  . levothyroxine  50 mcg Oral QAC breakfast  . midodrine  5 mg Oral TID WC  . multivitamin with minerals  1 tablet Oral Daily  . pantoprazole  40 mg Oral Q1200  . sodium chloride flush  3 mL Intravenous Q12H   Continuous Infusions: . sodium chloride      Assessment/Plan:  1. Atrial fibrillation with rapid ventricular response.  Continue low-dose metoprolol twice daily.  Continue oral amiodarone, heart rate elevated however she is sinus tachycardia 2. Acute hypoxic respiratory failure.  Wean oxygen as tolerated 3. Aspiration pneumonia status post treatment with antibiotics 4. Acute systolic congestive heart failure with severe aortic stenosisLV EF: 25% -   30%.  Continue Lasix and digoxin 5. Upper GI bleed with hematemesis.  Continue Carafate and Protonix 6. NSTEMI.  Unable to do heparin drip secondary to GI bleed.  restarted enteric-coated aspirin.  Patient on low-dose metoprolol.  Continue atorvastatin.  Due to acute renal failure recent GI bleed not a good candidate for cardiac catheterization 7. 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  Ovarian cancer primary.  Patient received her second chemotherapy on Friday.   Per Dr. Tasia Catchings,  If she fully recovers, she can follow up outpatient for reevaluation if she is a candidate for additional chemotherapy. WBC count improved  8. Hyperlipidemia unspecified on atorvastatin 9. Hypothyroidism unspecified. On levothyroxine. 10. Pancytopenia.  Continue Granix.  For low white blood cell count  Generalized weakness.  PT evaluation.   Code Status:     Code  Status Orders  (From admission, onward)        Start     Ordered   12/19/16 0526  Full code  Continuous     12/19/16 0526    Code Status History    Date Active Date Inactive Code Status Order ID Comments User Context   11/20/2016 16:39 11/22/2016 19:50 Full Code [id number]  Idelle Crouch, MD ED    Advance Directive Documentation     Most Recent Value  Type of Advance Directive  Healthcare Power of Attorney  Pre-existing out of facility DNR order (yellow form or pink MOST form)  No data  "MOST" Form in Place?  No data     Family Communication: spoke with her daughter at the bedside Disposition Plan: to be determined.  Consultants:  Cardiology  Gastroenterology  Oncology  Critical care  specialist  Time spent: 32 minutes.  Posey Pronto [facility]

## 2016-12-28 NOTE — Progress Notes (Signed)
Patient ID: Ebony Navarro, female   DOB: 17-Oct-1933, 83 y.o.   MRN: [id number]  Ebony Navarro  Ebony Navarro [id number]:[id number] DOB: 1933-08-12 DOA: 12/19/2016 PCP: Adin Hector, MD  HPI/Subjective: Patient more short of breath.  She did not wear her BiPAP last night   Objective: Vitals:   12/28/16 1127 12/28/16 1259  BP: 99/72 (!) 100/57  Pulse: (!) 109 92  Resp:    Temp:    SpO2: 100% 98%    Filed Weights   12/26/16 0412 12/27/16 0414 12/28/16 0241  Weight: 161 lb 11.2 oz (73.3 kg) 163 lb 6.4 oz (74.1 kg) 164 lb 11.2 oz (74.7 kg)    ROS: Review of Systems  Constitutional: Negative for chills and fever.  HENT: Positive for hearing loss.   Eyes: Negative for blurred vision.  Respiratory: Positive for cough and shortness of breath.   Cardiovascular: Negative for chest pain.  Gastrointestinal: Negative for abdominal pain, constipation, diarrhea, nausea and vomiting.  Genitourinary: Negative for dysuria.  Musculoskeletal: Negative for joint pain.  Neurological: Negative for dizziness and headaches.   Exam: Physical Exam  Constitutional: She is oriented to person, place, and time. She appears well-developed.  HENT:  Head: Normocephalic.  Nose: No mucosal edema.  Mouth/Throat: No oropharyngeal exudate or posterior oropharyngeal edema.  Eyes: Conjunctivae, EOM and lids are normal. Pupils are equal, round, and reactive to light.  Neck: Neck supple. No JVD present. Carotid bruit is not present. No edema present. No thyroid mass and no thyromegaly present.  Cardiovascular: Normal rate, S1 normal and S2 normal. Exam reveals no gallop.  Murmur heard. Pulses:      Dorsalis pedis pulses are 2+ on the right side, and 2+ on the left side.  Respiratory: No respiratory distress. She has decreased breath sounds in the right middle field, the right lower field, the left middle field and the left lower field. She has wheezes in the left middle field. She has  rhonchi in the right lower field and the left lower field. She has no rales.  GI: Soft. Bowel sounds are normal. She exhibits no distension. There is no tenderness.  Musculoskeletal:       Right ankle: She exhibits no swelling.       Left ankle: She exhibits no swelling.  Lymphadenopathy:    She has no cervical adenopathy.  Neurological: She is alert and oriented to person, place, and time. No cranial nerve deficit.  Skin: Skin is warm. No rash noted. Nails show no clubbing.  Psychiatric: She has a normal mood and affect.      Data Reviewed: Basic Metabolic Panel: Recent Labs  Lab 12/22/16 0524 12/24/16 0512 12/25/16 0446 12/26/16 0456 12/27/16 0505  NA 130* 134* 135 135 130*  K 4.2 3.6 3.4* 4.3 4.2  CL 95* 99* 101 100* 95*  CO2 20* 24 25 26 26   GLUCOSE 168* 133* 104* 162* 200*  BUN 70* 92* 82* 77* 79*  CREATININE 2.00* 2.09* 1.62* 1.56* 1.46*  CALCIUM 7.9* 8.2* 8.1* 8.5* 8.4*  MG  --  2.2  --   --   --    Liver Function Tests: No results for input(s): AST, ALT, ALKPHOS, BILITOT, PROT, ALBUMIN in the last 168 hours. CBC: Recent Labs  Lab 12/24/16 0512 12/25/16 0446 12/26/16 0456 12/27/16 0505 12/28/16 0426  WBC 0.8* 0.4* 1.0* 3.2* 6.7  NEUTROABS 0.4*  --   --   --  4.7  HGB 9.4* 9.5* 10.0* 10.3* 11.0*  HCT 29.1* 28.7* 30.7* 31.2* 32.4*  MCV 92.1 91.5 92.8 92.6 92.0  PLT 55* 61* 81* 78* 74*   Cardiac Enzymes: Recent Labs  Lab 12/22/16 1721  TROPONINI 4.18*     Studies: Dg Chest Port 1 View  Result Date: 12/26/2016 CLINICAL DATA:  Shortness of Breath EXAM: PORTABLE CHEST 1 VIEW COMPARISON:  12/23/2016 FINDINGS: Cardiac shadow is stable. Persistent right upper lobe infiltrative changes are seen. Increasing central vascular congestion and interstitial changes noted. Likely small effusions are present as well. IMPRESSION: Slight increase in the degree of CHF. Patchy infiltrative changes in the right upper lobe are again noted. Electronically Signed   By: Ebony Navarro M.D.   On: 12/26/2016 16:26    Scheduled Meds: . amiodarone  200 mg Oral BID  . aspirin EC  81 mg Oral Daily  . atorvastatin  40 mg Oral Daily  . budesonide (PULMICORT) nebulizer solution  0.5 mg Nebulization BID  . digoxin  0.125 mg Oral Daily  . feeding supplement (ENSURE ENLIVE)  237 mL Oral BID BM  . furosemide  40 mg Intravenous Q12H  . insulin aspart  0-9 Units Subcutaneous TID WC  . levothyroxine  50 mcg Oral QAC breakfast  . midodrine  5 mg Oral TID WC  . multivitamin with minerals  1 tablet Oral Daily  . pantoprazole  40 mg Oral Q1200  . sodium chloride flush  3 mL Intravenous Q12H   Continuous Infusions: . sodium chloride      Assessment/Plan:  1. Acute respiratory failure due to combination of aspiration pneumonia and acute CHF: Continue BiPAP at night 2. Aspiration pneumonia status post treatment with antibiotics 3. Acute systolic congestive heart failure with severe aortic stenosisLV EF: 25% -   30%.  Continue Lasix and digoxin, I will try to increase Lasix if her blood pressure allows  4. Upper GI bleed with hematemesis.  Continue Carafate and Protonix 5. NSTEMI.  Unable to do heparin drip secondary to GI bleed.  restarted enteric-coated aspirin.  Patient on low-dose metoprolol.  Continue atorvastatin.  Due to acute renal failure recent GI bleed not a good candidate for cardiac catheterization 6. 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  Ovarian cancer primary.  Patient received her second chemotherapy on Friday.  Wbc count now normal 7. Hyperlipidemia unspecified on atorvastatin 8. Hypothyroidism unspecified. On levothyroxine. 10. Pancytopenia.  Continue Granix.  For low white blood cell count  Generalized weakness.  PT evaluation.   Code Status:     Code Status Orders  (From admission, onward)        Start     Ordered   12/19/16 0526  Full code  Continuous     12/19/16 0526    Code Status History    Date Active Date Inactive Code  Status Order ID Comments User Context   11/20/2016 16:39 11/22/2016 19:50 Full Code [id number]  Idelle Crouch, MD ED    Advance Directive Documentation     Most Recent Value  Type of Advance Directive  Healthcare Power of Attorney  Pre-existing out of facility DNR order (yellow form or pink MOST form)  No data  "MOST" Form in Place?  No data     Family Communication: spoke with her daughter at the bedside Disposition Plan: to be determined.  Consultants:  Cardiology  Gastroenterology  Oncology  Critical care specialist  Time spent: 32 minutes.  Posey Pronto [facility]

## 2016-12-28 NOTE — H&P (Signed)
Talked to Dr. Marcille Blanco about patient anxious about the Bipap, patient received Xanax, PRN asked if patient can have Melatonin to help patient sleep, order given. RN will continue to monitor.

## 2016-12-28 NOTE — Progress Notes (Signed)
Talked to Dr. Jerelyn Charles about patient's BP of 102/54, patient's BP has been soft this shift. Patient has scheduled IV lasix 40 mg, dose increase today, per MD ok to give. RN will continue to monitor.

## 2016-12-29 LAB — GLUCOSE, CAPILLARY
GLUCOSE-CAPILLARY: 153 mg/dL — AB (ref 65–99)
GLUCOSE-CAPILLARY: 118 mg/dL — AB (ref 65–99)
Glucose-Capillary: 123 mg/dL — ABNORMAL HIGH (ref 65–99)
Glucose-Capillary: 176 mg/dL — ABNORMAL HIGH (ref 65–99)

## 2016-12-29 LAB — BASIC METABOLIC PANEL
ANION GAP: 10 (ref 5–15)
BUN: 60 mg/dL — ABNORMAL HIGH (ref 6–20)
CO2: 29 mmol/L (ref 22–32)
Calcium: 7.8 mg/dL — ABNORMAL LOW (ref 8.9–10.3)
Chloride: 98 mmol/L — ABNORMAL LOW (ref 101–111)
Creatinine, Ser: 1.29 mg/dL — ABNORMAL HIGH (ref 0.44–1.00)
GFR calc Af Amer: 44 mL/min — ABNORMAL LOW (ref >60)
GFR, EST NON AFRICAN AMERICAN: 38 mL/min — AB (ref >60)
Glucose, Bld: 138 mg/dL — ABNORMAL HIGH (ref 65–99)
POTASSIUM: 3.7 mmol/L (ref 3.5–5.1)
SODIUM: 137 mmol/L (ref 135–145)

## 2016-12-29 MED ORDER — DIPHENHYDRAMINE HCL 50 MG/ML IJ SOLN
12.5000 mg | Freq: Once | INTRAMUSCULAR | Status: AC
  Administered 2016-12-30: 12.5 mg via INTRAVENOUS

## 2016-12-29 NOTE — Progress Notes (Addendum)
Hematology/Oncology Progress Note [facility] [phone] [phone]  Patient Care Team: Adin Hector, MD as PCP - General (Internal Medicine) Clent Jacks, RN as Registered Nurse   Name of the patient: Ebony Navarro  [id number]  January 13, 1934  Date of visit: 12/29/16   INTERVAL HISTORY- Patient was seen and examined at bedside. She has shortness of breath. Can not tolerate BPAP. Drowsy as she was not able to sleep at night. No chest pain. Daughter at bedside.  Review of systems- Review of Systems  Constitutional: Positive for malaise/fatigue. Negative for chills and fever.  HENT: Negative for hearing loss.   Eyes: Negative for blurred vision.  Respiratory: Positive for shortness of breath. Negative for cough.   Cardiovascular: Negative for leg swelling.  Gastrointestinal: Negative for heartburn and nausea.  Genitourinary: Negative for dysuria.  Musculoskeletal: Negative for myalgias.  Skin: Negative for rash.  Neurological: Positive for weakness. Negative for dizziness.  Endo/Heme/Allergies: Does not bruise/bleed easily.  Psychiatric/Behavioral: Negative for depression.    Allergies  Allergen Reactions  . Ace Inhibitors Other (See Comments)    hyperkalemia  . Atenolol Other (See Comments)    Worsening palpitations  . Iodine Other (See Comments)    11/20/16: per conversation with pt, pt with allergy to topical iodine and betadine.  Pt states she has had IV contrast in the past with now issues.  . Metoprolol Tartrate Other (See Comments)    intolerant  . Omeprazole Diarrhea  . Povidone-Iodine Other (See Comments)  . Rofecoxib Nausea Only  . Ciprofloxacin Rash  . Doxycycline Calcium Rash  . Nitrofurantoin Rash  . Quinolones Rash  . Sulfa Antibiotics Rash    Patient Active Problem List   Diagnosis Date Noted  . Neutropenia, drug-induced ([place])   . Thrombocytopenia ([place])   . Acute respiratory failure with  hypoxemia ([place])   . Shortness of breath   . NSTEMI (non-ST elevated myocardial infarction) ([place]) 12/19/2016  . Ovarian cancer ([place]) 11/24/2016  . Malignant neoplasm of ovary ([place]) 11/24/2016  . Abdominal pain 11/20/2016  . Ascites 11/20/2016  . Ovarian mass 11/20/2016  . Absolute anemia 01/22/2015  . Anxiety 01/22/2015  . Malignant neoplasm of breast ([place]) 01/22/2015  . Clinical depression 01/22/2015  . Gastric catarrh 01/22/2015  . Blood glucose elevated 01/22/2015  . HLD (hyperlipidemia) 01/22/2015  . BP (high blood pressure) 01/22/2015  . Acquired atrophy of thyroid 04/02/2014  . Frequent UTI 11/23/2013  . Chronic kidney disease (CKD), stage III (moderate) ([place]) 10/02/2013     Past Medical History:  Diagnosis Date  . Breast cancer ([place]) 2005   Right, radiation and lumpectomy  . Breast cancer ([place]) 2002   Left, Chemo, radiation and lumpectomy  . Cancer ([place])   . Hyperlipidemia   . Hypertension   . Ovarian cancer ([place]) 11/24/2016     Past Surgical History:  Procedure Laterality Date  . BREAST BIOPSY Left 2010   negative stereotactic biopsy  . BREAST LUMPECTOMY Right 2005   positive  . BREAST LUMPECTOMY Left 2002   positive  . PORTA CATH INSERTION N/A 12/09/2016   Procedure: PORTA CATH INSERTION;  Surgeon: Algernon Huxley, MD;  Location: [facility];  Service: Cardiovascular;  Laterality: N/A;    Social History   Socioeconomic History  . Marital status: Widowed    Spouse name: Not on file  . Number of children: Not on file  . Years of education: Not on file  . Highest education level:  Not on file  Social Needs  . Financial resource strain: Not on file  . Food insecurity - worry: Not on file  . Food insecurity - inability: Not on file  . Transportation needs - medical: Not on file  . Transportation needs - non-medical: Not on file  Occupational History  . Occupation: retired  Tobacco Use  . Smoking status: Never Smoker  . Smokeless tobacco: Never  Used  Substance and Sexual Activity  . Alcohol use: No  . Drug use: No  . Sexual activity: No    Birth control/protection: Post-menopausal  Other Topics Concern  . Not on file  Social History Narrative  . Not on file     Family History  Problem Relation Age of Onset  . Hypertension Mother   . Hypertension Father      Current Facility-Administered Medications:  .  0.9 %  sodium chloride infusion, 250 mL, Intravenous, PRN, Pyreddy, Pavan, MD .  acetaminophen (TYLENOL) tablet 650 mg, 650 mg, Oral, Q4H PRN, Loletha Grayer, MD, 650 mg at 12/24/16 2358 .  ALPRAZolam Duanne Moron) tablet 0.25 mg, 0.25 mg, Oral, BID PRN, Awilda Bill, NP, 0.25 mg at 12/29/16 2119 .  amiodarone (PACERONE) tablet 200 mg, 200 mg, Oral, BID, Clabe Seal, PA-C, 200 mg at 12/29/16 2119 .  aspirin EC tablet 81 mg, 81 mg, Oral, Daily, Loletha Grayer, MD, 81 mg at 12/29/16 0757 .  atorvastatin (LIPITOR) tablet 40 mg, 40 mg, Oral, Daily, Pyreddy, Pavan, MD, 40 mg at 12/29/16 0755 .  budesonide (PULMICORT) nebulizer solution 0.5 mg, 0.5 mg, Nebulization, BID, Leslye Peer, Richard, MD, 0.5 mg at 12/29/16 2044 .  digoxin (LANOXIN) tablet 0.125 mg, 0.125 mg, Oral, Daily, Dustin Flock, MD, 0.125 mg at 12/29/16 0759 .  feeding supplement (ENSURE ENLIVE) (ENSURE ENLIVE) liquid 237 mL, 237 mL, Oral, BID BM, Wieting, Richard, MD, 237 mL at 12/29/16 0923 .  furosemide (LASIX) injection 40 mg, 40 mg, Intravenous, Q12H, Dustin Flock, MD, 40 mg at 12/29/16 1728 .  hydrOXYzine (ATARAX/VISTARIL) tablet 25 mg, 25 mg, Oral, TID PRN, Lance Coon, MD, 25 mg at 12/28/16 2302 .  insulin aspart (novoLOG) injection 0-9 Units, 0-9 Units, Subcutaneous, TID WC, Dustin Flock, MD, 1 Units at 12/29/16 1142 .  ipratropium-albuterol (DUONEB) 0.5-2.5 (3) MG/3ML nebulizer solution 3 mL, 3 mL, Nebulization, Q4H PRN, Wilhelmina Mcardle, MD, 3 mL at 12/29/16 1326 .  levothyroxine (SYNTHROID, LEVOTHROID) tablet 50 mcg, 50 mcg, Oral, QAC breakfast,  Pyreddy, Pavan, MD, 50 mcg at 12/29/16 0756 .  Melatonin TABS 5 mg, 5 mg, Oral, QHS, Harrie Foreman, MD, 5 mg at 12/29/16 2119 .  metoprolol tartrate (LOPRESSOR) injection 5 mg, 5 mg, Intravenous, Q1H PRN, Wieting, Richard, MD .  midodrine (PROAMATINE) tablet 5 mg, 5 mg, Oral, TID WC, Dustin Flock, MD, 5 mg at 12/29/16 1728 .  multivitamin with minerals tablet 1 tablet, 1 tablet, Oral, Daily, Loletha Grayer, MD, 1 tablet at 12/29/16 0756 .  nitroGLYCERIN (NITROSTAT) SL tablet 0.4 mg, 0.4 mg, Sublingual, Q5 Min x 3 PRN, Pyreddy, Pavan, MD .  ondansetron (ZOFRAN) injection 4 mg, 4 mg, Intravenous, Q6H PRN, Pyreddy, Pavan, MD, 4 mg at 12/22/16 1856 .  pantoprazole (PROTONIX) EC tablet 40 mg, 40 mg, Oral, Q1200, Wilhelmina Mcardle, MD, 40 mg at 12/29/16 1142 .  sodium chloride flush (NS) 0.9 % injection 3 mL, 3 mL, Intravenous, Q12H, Pyreddy, Pavan, MD, 3 mL at 12/29/16 2120 .  sodium chloride flush (NS) 0.9 % injection 3 mL, 3  mL, Intravenous, PRN, Saundra Shelling, MD, 3 mL at 12/27/16 0526   Physical exam:  Vitals:   12/29/16 1323 12/29/16 1729 12/29/16 1823 12/29/16 2020  BP:  (!) 103/57  (!) 106/49  Pulse: 84 81 (!) 125 86  Resp:  18  17  Temp:    (!) 97.5 F (36.4 C)  TempSrc:    Oral  SpO2: 95% 98% 96% 94%  Weight:      Height:           CMP Latest Ref Rng & Units 12/29/2016  Glucose 65 - 99 mg/dL 138(H)  BUN 6 - 20 mg/dL 60(H)  Creatinine 0.44 - 1.00 mg/dL 1.29(H)  Sodium 135 - 145 mmol/L 137  Potassium 3.5 - 5.1 mmol/L 3.7  Chloride 101 - 111 mmol/L 98(L)  CO2 22 - 32 mmol/L 29  Calcium 8.9 - 10.3 mg/dL 7.8(L)  Total Protein 6.5 - 8.1 g/dL -  Total Bilirubin 0.3 - 1.2 mg/dL -  Alkaline Phos 38 - 126 U/L -  AST 15 - 41 U/L -  ALT 14 - 54 U/L -   CBC Latest Ref Rng & Units 12/28/2016  WBC 3.6 - 11.0 K/uL 6.7  Hemoglobin 12.0 - 16.0 g/dL 11.0(L)  Hematocrit 35.0 - 47.0 % 32.4(L)  Platelets 150 - 440 K/uL 74(L)       Assessment and plan-  Patient is a 83  y.o. female with remote history of breast cancer, recently diagnosed with stage III ovarian cancer being treated with curative intent status,  post 2 cycles of carboplatin + Taxol+ the Avastin, planned debulking surgery after 4 cycles of chemotherapy treatment, presented with shortness of breath and chest discomfort.  # Stage III ovarian cancer: no plan for chemotherapy until recovers.  no need for paracentesis at this point.  # Neutropenia: resolved.Marland Kitchenoff Granix.. # Thrombocytopenia:  likely chemotherapy induced. No active bleeding. Continue monitor. Continue Aspirin. Trending up # NSTEMI/CHF: Deferred management per cardiology management.   # acute respiratory failure: BPAP QHS if tolerating.   # CODE STATUS:DNR/DNI.   Discussed with daughter that although patient's cancer responds well, patient has declined functional status due to NSTEMI, CHF and deconditioning. She may not recover to the level to be strong enough to receive further chemotherapies. Daughter understands and hope patient can recover soon.   Will continue follow along her inpatient course.  Total face to face encounter time for this patient visit was 25 min. >50% of the time was  spent in counseling and coordination of care.    Earlie Server, MD, PhD Hematology Oncology [facility] Pager- [phone] 12/29/2016

## 2016-12-29 NOTE — Progress Notes (Signed)
Talked to [place], patient's daughter about patient's refusing her Bipap tonight. RN gave Xanax, Atarax and Melatonin to help patient tolerate mask and help sleep, patient is refusing initially but after daughter talk to the patient, patient agree to try and wear it. No other concern at the moment. RN will continue to monitor.

## 2016-12-29 NOTE — Progress Notes (Signed)
Hb has been stable. Still requiring Bipap. No evidence of bleeding .has had borderline low BP  I will sign off.  Please call me if any further GI concerns or questions.  We would like to thank you for the opportunity to participate in the care of Ebony Navarro.   BP (!) 113/56 (BP Location: Right Arm)   Pulse 88   Temp (!) 97.5 F (36.4 C) (Oral)   Resp 18   Ht 5\' 7"  (1.702 m)   Wt 164 lb 11.2 oz (74.7 kg)   SpO2 94%   BMI 25.80 kg/m

## 2016-12-29 NOTE — Progress Notes (Signed)
[place] at [place] NAME: Ebony Navarro    MR#:  [id number]  DATE OF BIRTH:  [birth year]  SUBJECTIVE:    REVIEW OF SYSTEMS:   ROS Tolerating Diet: Tolerating PT:   DRUG ALLERGIES:   Allergies  Allergen Reactions  . Ace Inhibitors Other (See Comments)    hyperkalemia  . Atenolol Other (See Comments)    Worsening palpitations  . Iodine Other (See Comments)    11/20/16: per conversation with pt, pt with allergy to topical iodine and betadine.  Pt states she has had IV contrast in the past with now issues.  . Metoprolol Tartrate Other (See Comments)    intolerant  . Omeprazole Diarrhea  . Povidone-Iodine Other (See Comments)  . Rofecoxib Nausea Only  . Ciprofloxacin Rash  . Doxycycline Calcium Rash  . Nitrofurantoin Rash  . Quinolones Rash  . Sulfa Antibiotics Rash    VITALS:  Blood pressure (!) 103/57, pulse (!) 125, temperature (!) 97.5 F (36.4 C), temperature source Oral, resp. rate 18, height 5\' 7"  (1.702 m), weight 74.7 kg (164 lb 11.2 oz), SpO2 96 %.  PHYSICAL EXAMINATION:   Physical Exam  GENERAL:  83 y.o.-year-old patient lying in the bed with no acute distress.  EYES: Pupils equal, round, reactive to light and accommodation. No scleral icterus. Extraocular muscles intact.  HEENT: Head atraumatic, normocephalic. Oropharynx and nasopharynx clear.  NECK:  Supple, no jugular venous distention. No thyroid enlargement, no tenderness.  LUNGS: Normal breath sounds bilaterally, no wheezing, rales, rhonchi. No use of accessory muscles of respiration.  CARDIOVASCULAR: S1, S2 normal. No murmurs, rubs, or gallops.  ABDOMEN: Soft, nontender, nondistended. Bowel sounds present. No organomegaly or mass.  EXTREMITIES: No cyanosis, clubbing or edema b/l.    NEUROLOGIC: Cranial nerves II through XII are intact. No focal Motor or sensory deficits b/l.   PSYCHIATRIC:  patient is alert and oriented x 3.  SKIN: No obvious  rash, lesion, or ulcer.   LABORATORY PANEL:  CBC Recent Labs  Lab 12/28/16 0426  WBC 6.7  HGB 11.0*  HCT 32.4*  PLT 74*    Chemistries  Recent Labs  Lab 12/24/16 0512  12/29/16 0407  NA 134*   < > 137  K 3.6   < > 3.7  CL 99*   < > 98*  CO2 24   < > 29  GLUCOSE 133*   < > 138*  BUN 92*   < > 60*  CREATININE 2.09*   < > 1.29*  CALCIUM 8.2*   < > 7.8*  MG 2.2  --   --    < > = values in this interval not displayed.   Cardiac Enzymes No results for input(s): TROPONINI in the last 168 hours. RADIOLOGY:  No results found. ASSESSMENT AND PLAN:  Ebony Navarro  is a 83 y.o. female with a known history of breast cancer, ovarian cancer, hyperlipidemia, hypertension presented to the emergency room with shortness of breath and chest pressure  1. Acute respiratory failure due to combination of aspiration pneumonia and acute CHF: Continue BiPAP at night 2. Aspiration pneumonia status post treatment with antibiotics--completed 3. Acute systolic congestive heart failure with severe aortic stenosisLV EF: 25% - 30%. -  Continue Lasix and digoxin, I will try to increase Lasix if her blood pressure allows  4. Upper GI bleed with hematemesis.  Continue Carafate and Protonix 5. NSTEMI.  Unable to do heparin drip secondary to GI bleed.  restarted  enteric-coated aspirin.  Patient on low-dose metoprolol.  Continue atorvastatin.  Due to acute renal failure recent GI bleed not a good candidate for cardiac catheterization 6. 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  Ovarian cancer primary.  Patient received her second chemotherapy on Friday.  Wbc count now normal 7. Hyperlipidemia unspecified on atorvastatin 8. Hypothyroidism unspecified. On levothyroxine. 9. Pancytopenia.  Continue Granix.  For low white blood cell count  Overall improving and making a slow progress.  Discussed with his son patient is currently appears to be at baseline.  Her labs are improved.  Will need to work  towards discharge and rehab now.  Both son and patient are agreeable.  Son will let us know the choice of his rehab bed after discussing with his sister   Case discussed with Care Management/Social Worker. Management plans discussed with the patient, family and they are in agreement.  CODE STATUS: DNR  DVT Prophylaxis: scd  TOTAL TIME TAKING CARE OF THIS PATIENT: *25* minutes.  >50% time spent on counselling and coordination of care  POSSIBLE D/C IN 1-2 DAYS, DEPENDING ON CLINICAL CONDITION.  Note: This dictation was prepared with Dragon dictation along with smaller phrase technology. Any transcriptional errors that result from this process are unintentional.  Fritzi Mandes M.D on 12/29/2016 at 8:02 PM  Between 7am to 6pm - Pager - [phone]  After 6pm go to [email or web address] - password [email or web address] [facility]  Office  [phone]  CC: Primary care physician; Adin Hector, MD

## 2016-12-30 LAB — GLUCOSE, CAPILLARY
GLUCOSE-CAPILLARY: 90 mg/dL (ref 65–99)
GLUCOSE-CAPILLARY: 182 mg/dL — AB (ref 65–99)
GLUCOSE-CAPILLARY: 123 mg/dL — AB (ref 65–99)
Glucose-Capillary: 123 mg/dL — ABNORMAL HIGH (ref 65–99)

## 2016-12-30 MED ORDER — DIPHENHYDRAMINE HCL 50 MG/ML IJ SOLN
INTRAMUSCULAR | Status: AC
  Administered 2016-12-30: 12.5 mg via INTRAVENOUS
  Filled 2016-12-30: qty 1

## 2016-12-30 MED ORDER — GUAIFENESIN-DM 100-10 MG/5ML PO SYRP
5.0000 mL | ORAL_SOLUTION | ORAL | Status: DC | PRN
  Administered 2016-12-30: 5 mL via ORAL
  Filled 2016-12-30: qty 5

## 2016-12-30 MED ORDER — FUROSEMIDE 20 MG PO TABS
20.0000 mg | ORAL_TABLET | Freq: Every day | ORAL | Status: DC
  Administered 2016-12-30 – 2017-01-01 (×3): 20 mg via ORAL
  Filled 2016-12-30 (×3): qty 1

## 2016-12-30 MED ORDER — OXYCODONE-ACETAMINOPHEN 5-325 MG PO TABS
1.0000 | ORAL_TABLET | ORAL | Status: DC | PRN
  Administered 2016-12-30 – 2017-01-01 (×6): 1 via ORAL
  Filled 2016-12-30 (×6): qty 1

## 2016-12-30 NOTE — Clinical Social Work Note (Signed)
CSW attempted to contact the patient's daughter to update that [facility] does not have available beds. CSW left a HIPPA compliant voicemail. CSW will continue to follow.  Santiago Bumpers, MSW, Latanya Presser [phone]

## 2016-12-30 NOTE — Progress Notes (Signed)
[place] at [place] NAME: Ebony Navarro    MR#:  [id number]  DATE OF BIRTH:  [birth year]  SUBJECTIVE:   Patient feels a lot better.  Mild cough.  Shortness of breath improving.  Daughter in the room. REVIEW OF SYSTEMS:   Review of Systems  Constitutional: Negative for chills, fever and weight loss.  HENT: Positive for sore throat. Negative for ear discharge, ear pain and nosebleeds.   Eyes: Negative for blurred vision, pain and discharge.  Respiratory: Positive for cough and shortness of breath. Negative for sputum production, wheezing and stridor.   Cardiovascular: Negative for chest pain, palpitations, orthopnea and PND.  Gastrointestinal: Negative for abdominal pain, diarrhea, nausea and vomiting.  Genitourinary: Negative for frequency and urgency.  Musculoskeletal: Negative for back pain and joint pain.  Neurological: Positive for weakness. Negative for sensory change, speech change and focal weakness.  Psychiatric/Behavioral: Negative for depression and hallucinations. The patient is not nervous/anxious.    Tolerating Diet: Yes tolerating PT: Rehab  DRUG ALLERGIES:   Allergies  Allergen Reactions  . Ace Inhibitors Other (See Comments)    hyperkalemia  . Atenolol Other (See Comments)    Worsening palpitations  . Iodine Other (See Comments)    11/20/16: per conversation with pt, pt with allergy to topical iodine and betadine.  Pt states she has had IV contrast in the past with now issues.  . Metoprolol Tartrate Other (See Comments)    intolerant  . Omeprazole Diarrhea  . Povidone-Iodine Other (See Comments)  . Rofecoxib Nausea Only  . Ciprofloxacin Rash  . Doxycycline Calcium Rash  . Nitrofurantoin Rash  . Quinolones Rash  . Sulfa Antibiotics Rash    VITALS:  Blood pressure (!) 101/56, pulse 90, temperature 97.8 F (36.6 C), temperature source Oral, resp. rate 19, height 5\' 7"  (1.702 m), weight 73.3 kg (161 lb  11.2 oz), SpO2 100 %.  PHYSICAL EXAMINATION:   Physical Exam  GENERAL:  83 y.o.-year-old patient lying in the bed with no acute distress.  EYES: Pupils equal, round, reactive to light and accommodation. No scleral icterus. Extraocular muscles intact.  HEENT: Head atraumatic, normocephalic. Oropharynx and nasopharynx clear.  NECK:  Supple, no jugular venous distention. No thyroid enlargement, no tenderness.  LUNGS: Normal breath sounds bilaterally, no wheezing,  rhonchi. No use of accessory muscles of respiration.  Few bibasilar crackles present CARDIOVASCULAR: S1, S2 normal. No murmurs, rubs, or gallops.  ABDOMEN: Soft, nontender, nondistended. Bowel sounds present. No organomegaly or mass.  EXTREMITIES: No cyanosis, clubbing or edema b/l.    NEUROLOGIC: Cranial nerves II through XII are intact. No focal Motor or sensory deficits b/l.   PSYCHIATRIC:  patient is alert and oriented x 3.  SKIN: No obvious rash, lesion, or ulcer.   LABORATORY PANEL:  CBC Recent Labs  Lab 12/28/16 0426  WBC 6.7  HGB 11.0*  HCT 32.4*  PLT 74*    Chemistries  Recent Labs  Lab 12/24/16 0512  12/29/16 0407  NA 134*   < > 137  K 3.6   < > 3.7  CL 99*   < > 98*  CO2 24   < > 29  GLUCOSE 133*   < > 138*  BUN 92*   < > 60*  CREATININE 2.09*   < > 1.29*  CALCIUM 8.2*   < > 7.8*  MG 2.2  --   --    < > = values in this interval not displayed.  Cardiac Enzymes No results for input(s): TROPONINI in the last 168 hours. RADIOLOGY:  No results found. ASSESSMENT AND PLAN:  Ebony Navarro  is a 83 y.o. female with a known history of breast cancer, ovarian cancer, hyperlipidemia, hypertension presented to the emergency room with shortness of breath and chest pressure  1. Acute respiratory failure due to combination of aspiration pneumonia and acute CHF: Continue BiPAP at night 2. Aspiration pneumonia status post treatment with antibiotics--completed 3. Acute systolic congestive heart failure with  severe aortic stenosisLV EF: 25% - 30%. -  Continue Lasix and digoxin -Patient has diuresed quite well in the last 2-3 days.  I will change to oral Lasix. 4. Upper GI bleed with hematemesis.  Continue Carafate and Protonix 5. Acute NSTEMI.  Unable to do heparin drip secondary to GI bleed.  restarted enteric-coated aspirin.  Patient on low-dose metoprolol.  Continue atorvastatin.  Due to acute renal failure recent GI bleed not a good candidate for cardiac catheterization 6. 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  Ovarian cancer primary.  Patient received her second chemotherapy on Friday.  Wbc count now normal.  Patient is followed by oncology recommends patient will need rehab and good performance status prior to resuming chemo. 7. Hyperlipidemia unspecified on atorvastatin 8. Hypothyroidism unspecified. On levothyroxine. 9. Pancytopenia.  Continue Granix.  For low white blood cell count  Overall improving and making a slow progress.  Discussed with his son patient is currently appears to be at baseline.  Her labs are improved.  Will need to work towards discharge and rehab now.  Both son and patient are agreeable.  -Spoke with daughter Ebony Navarro at length.  She and patient wants to go to [facility] [place].  I have left a message for the social worker to see if [facility] offered bed.  Case discussed with Care Management/Social Worker. Management plans discussed with the patient, family and they are in agreement.  CODE STATUS: DNR  DVT Prophylaxis: scd  TOTAL TIME TAKING CARE OF THIS PATIENT: *25* minutes.  >50% time spent on counselling and coordination of care  POSSIBLE D/C IN 1-2 DAYS, DEPENDING ON CLINICAL CONDITION.  Note: This dictation was prepared with Dragon dictation along with smaller phrase technology. Any transcriptional errors that result from this process are unintentional.  Fritzi Mandes M.D on 12/30/2016 at 1:17 PM  Between 7am to 6pm - Pager - [phone]  After  6pm go to [email or web address] - password [email or web address] [facility]  Office  [phone]  CC: Primary care physician; Adin Hector, MD

## 2016-12-30 NOTE — Progress Notes (Signed)
Foley removed at approximately 4:30 pm. Pt has not urinated since it was removed. States she does not feel the urge to go. Bladder scan revealed 243 mls of urine.

## 2016-12-31 LAB — GLUCOSE, CAPILLARY
GLUCOSE-CAPILLARY: 103 mg/dL — AB (ref 65–99)
GLUCOSE-CAPILLARY: 164 mg/dL — AB (ref 65–99)
GLUCOSE-CAPILLARY: 103 mg/dL — AB (ref 65–99)
Glucose-Capillary: 148 mg/dL — ABNORMAL HIGH (ref 65–99)

## 2016-12-31 MED ORDER — ASPIRIN 81 MG PO TBEC: 81.0000 mg | DELAYED_RELEASE_TABLET | Freq: Every day | ORAL | 0 refills | Status: AC

## 2016-12-31 MED ORDER — SALINE SPRAY 0.65 % NA SOLN
1.0000 | NASAL | Status: DC | PRN
  Administered 2016-12-31: 1 via NASAL
  Filled 2016-12-31: qty 44

## 2016-12-31 MED ORDER — AMIODARONE HCL 200 MG PO TABS: 200.0000 mg | ORAL_TABLET | Freq: Two times a day (BID) | ORAL | 0 refills | Status: AC

## 2016-12-31 MED ORDER — ADULT MULTIVITAMIN W/MINERALS CH: 1.0000 | ORAL_TABLET | Freq: Every day | ORAL | 0 refills | Status: AC

## 2016-12-31 MED ORDER — IPRATROPIUM-ALBUTEROL 0.5-2.5 (3) MG/3ML IN SOLN: 3.0000 mL | RESPIRATORY_TRACT | 1 refills | Status: AC | PRN

## 2016-12-31 MED ORDER — PANTOPRAZOLE SODIUM 40 MG PO TBEC: 40.0000 mg | DELAYED_RELEASE_TABLET | Freq: Every day | ORAL | 0 refills | Status: AC

## 2016-12-31 MED ORDER — MELATONIN 5 MG PO TABS: 5.0000 mg | ORAL_TABLET | Freq: Every day | ORAL | 0 refills | Status: AC

## 2016-12-31 MED ORDER — MIDODRINE HCL 5 MG PO TABS: 5.0000 mg | ORAL_TABLET | Freq: Three times a day (TID) | ORAL | 0 refills | Status: AC

## 2016-12-31 MED ORDER — FUROSEMIDE 20 MG PO TABS: 20.0000 mg | ORAL_TABLET | Freq: Every day | ORAL | 0 refills | Status: AC

## 2016-12-31 MED ORDER — ENSURE ENLIVE PO LIQD: 237.0000 mL | Freq: Two times a day (BID) | ORAL | 12 refills | Status: AC

## 2016-12-31 MED ORDER — ALPRAZOLAM 0.25 MG PO TABS: 0.2500 mg | ORAL_TABLET | Freq: Every evening | ORAL | 0 refills | Status: AC | PRN

## 2016-12-31 MED ORDER — PROMETHAZINE HCL 25 MG PO TABS: 25.0000 mg | ORAL_TABLET | Freq: Three times a day (TID) | ORAL | 0 refills | Status: AC | PRN

## 2016-12-31 MED ORDER — NITROGLYCERIN 0.4 MG SL SUBL: 0.4000 mg | SUBLINGUAL_TABLET | SUBLINGUAL | 1 refills | Status: AC | PRN

## 2016-12-31 MED ORDER — BENZONATATE 100 MG PO CAPS
100.0000 mg | ORAL_CAPSULE | Freq: Four times a day (QID) | ORAL | Status: DC
  Administered 2016-12-31 – 2017-01-01 (×5): 100 mg via ORAL
  Filled 2016-12-31 (×5): qty 1

## 2016-12-31 MED ORDER — GUAIFENESIN-DM 100-10 MG/5ML PO SYRP: 5.0000 mL | ORAL_SOLUTION | ORAL | 0 refills | Status: AC | PRN

## 2016-12-31 NOTE — Discharge Summary (Addendum)
[facility] [place] NAME: Ebony Navarro    MR#:  [id number]  DATE OF BIRTH:  [birth year]  DATE OF ADMISSION:  12/19/2016 ADMITTING PHYSICIAN: Saundra Shelling, MD  DATE OF DISCHARGE: 01/01/17  PRIMARY CARE PHYSICIAN: Adin Hector, MD    ADMISSION DIAGNOSIS:  NSTEMI (non-ST elevated myocardial infarction) ([place]) [I21.4] Acute respiratory failure with hypoxemia (HCC) [J96.01]  DISCHARGE DIAGNOSIS:  Acute NSTEMI--medical management Upper GI bleed--- medical management  Acute systolic congestive heart failure Atrial fibrillation on amiodarone-- not on anticoagulation due to GI bleed Acute hypoxic respiratory failure secondary to aspiration pneumonia and acute systolic congestive heart failure 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 Breast cancer ([place]) 2005   Right, radiation and lumpectomy  . Breast cancer ([place]) 2002   Left, Chemo, radiation and lumpectomy  . Cancer ([place])   . Hyperlipidemia   . Hypertension   . Ovarian cancer ([place]) 11/24/2016    HOSPITAL COURSE:   VirginiaFoglemanis a83 y.o.femalewith a known history of breast cancer, ovarian cancer, hyperlipidemia, hypertension presented to the emergency room with shortness of breath and chest pressure  1. Acute respiratory failure due to combination of aspiration pneumonia and acute systolic KKX:FGHWEXHB oxygen 2. Aspiration pneumonia status post treatment with antibiotics--completed 3. Acute systolic congestive heart failure with severe aortic stenosisLV EF: 25% - 30%. - Continue Lasix and digoxin -Patient has diuresed quite well in the last 2-3 days. Now on oral Lasix. 4. Upper GI bleed with hematemesis. Continue Protonix 5. Acute NSTEMI. Unable to do heparin drip secondary to GI bleed. restarted enteric-coated aspirin.  Patient on low-dose metoprolol. Continue atorvastatin. Due to acute renal failure recent GI bleed not a good candidate for cardiac catheterization 6. 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 Ovarian cancer primary. Patient received her second chemotherapy on Friday. Wbc count now normal.  Patient is followed by oncology recommends patient will need rehab and good performance status prior to resuming chemo. 7. Hyperlipidemia unspecified on atorvastatin 8. Hypothyroidism unspecified. On levothyroxine. 9. Pancytopenia. Continue Granix. For low white blood cell count  Overall improving and making a slow progress.    -Spoke with daughter Ebony Navarro at length.  She and patient wants to [facility].  [facility] does not have a bed at present.  Patient does have bed offers at other facilities.  Discussed with patient this morning if she can consider another facility for rehab.  Patient is best at baseline at present and will be discharged today  CONSULTS OBTAINED:  Treatment Team:  Isaias Cowman, MD Earlie Server, MD  DRUG ALLERGIES:   Allergies  Allergen Reactions  . Ace Inhibitors Other (See Comments)    hyperkalemia  . Atenolol Other (See Comments)    Worsening palpitations  . Iodine Other (See Comments)    11/20/16: per conversation with pt, pt with allergy to topical iodine and betadine.  Pt states she has had IV contrast in the past with now issues.  . Metoprolol Tartrate Other (See Comments)    intolerant  . Omeprazole Diarrhea  . Povidone-Iodine Other (See Comments)  . Rofecoxib Nausea Only  . Ciprofloxacin Rash  . Doxycycline Calcium Rash  . Nitrofurantoin Rash  . Quinolones Rash  . Sulfa Antibiotics Rash    DISCHARGE MEDICATIONS:   Current Discharge Medication List    START taking these medications   Details  ALPRAZolam (  XANAX) 0.25 MG tablet Take 1 tablet (0.25 mg total) by mouth at bedtime as needed for anxiety. Qty: 15 tablet, Refills: 0     amiodarone (PACERONE) 200 MG tablet Take 1 tablet (200 mg total) by mouth 2 (two) times daily. Qty: 60 tablet, Refills: 0    aspirin EC 81 MG EC tablet Take 1 tablet (81 mg total) by mouth daily. Qty: 30 tablet, Refills: 0    feeding supplement, ENSURE ENLIVE, (ENSURE ENLIVE) LIQD Take 237 mLs by mouth 2 (two) times daily between meals. Qty: 237 mL, Refills: 12    furosemide (LASIX) 20 MG tablet Take 1 tablet (20 mg total) by mouth daily. Qty: 30 tablet, Refills: 0    guaiFENesin-dextromethorphan (ROBITUSSIN DM) 100-10 MG/5ML syrup Take 5 mLs by mouth every 4 (four) hours as needed for cough. Qty: 118 mL, Refills: 0    ipratropium-albuterol (DUONEB) 0.5-2.5 (3) MG/3ML SOLN Take 3 mLs by nebulization every 4 (four) hours as needed. Qty: 360 mL, Refills: 1    Melatonin 5 MG TABS Take 1 tablet (5 mg total) by mouth at bedtime. Qty: 15 tablet, Refills: 0    midodrine (PROAMATINE) 5 MG tablet Take 1 tablet (5 mg total) by mouth 3 (three) times daily with meals. Qty: 90 tablet, Refills: 0    Multiple Vitamin (MULTIVITAMIN WITH MINERALS) TABS tablet Take 1 tablet by mouth daily. Qty: 30 tablet, Refills: 0    nitroGLYCERIN (NITROSTAT) 0.4 MG SL tablet Place 1 tablet (0.4 mg total) under the tongue every 5 (five) minutes x 3 doses as needed for chest pain. Qty: 20 tablet, Refills: 1    pantoprazole (PROTONIX) 40 MG tablet Take 1 tablet (40 mg total) by mouth daily at 12 noon. Qty: 30 tablet, Refills: 0      CONTINUE these medications which have CHANGED   Details  promethazine (PHENERGAN) 25 MG tablet Take 1 tablet (25 mg total) by mouth every 8 (eight) hours as needed for nausea or vomiting. Qty: 20 tablet, Refills: 0      CONTINUE these medications which have NOT CHANGED   Details  atorvastatin (LIPITOR) 40 MG tablet Take 40 mg by mouth daily.     dexamethasone (DECADRON) 4 MG tablet Take 8 mg daily by mouth. Start the day after chemotherapy for 2 days    levothyroxine  (SYNTHROID, LEVOTHROID) 50 MCG tablet TAKE 1 TABLET BY MOUTH DAILY ON AN EMPTY STOMACH WITH A GLASS OF WATER 30-60 MIN BEFORE BREAKFAST    megestrol (MEGACE) 20 MG tablet Take 1 tablet (20 mg total) by mouth daily. Qty: 30 tablet, Refills: 0    ondansetron (ZOFRAN) 8 MG tablet Take 1 tablet (8 mg total) by mouth 2 (two) times daily as needed for refractory nausea / vomiting. Start on day 3 after chemo. Qty: 30 tablet, Refills: 1   Associated Diagnoses: Malignant neoplasm of ovary, unspecified laterality (HCC)      STOP taking these medications     diltiazem (CARDIZEM CD) 120 MG 24 hr capsule      lidocaine-prilocaine (EMLA) cream      LORazepam (ATIVAN) 0.5 MG tablet      prochlorperazine (COMPAZINE) 10 MG tablet         If you experience worsening of your admission symptoms, develop shortness of breath, life threatening emergency, suicidal or homicidal thoughts you must seek medical attention immediately by calling 911 or calling your MD immediately  if symptoms less severe.  You Must read complete instructions/literature along with all the  possible adverse reactions/side effects for all the Medicines you take and that have been prescribed to you. Take any new Medicines after you have completely understood and accept all the possible adverse reactions/side effects.   Please note  You were cared for by a hospitalist during your hospital stay. If you have any questions about your discharge medications or the care you received while you were in the hospital after you are discharged, you can call the unit and asked to speak with the hospitalist on call if the hospitalist that took care of you is not available. Once you are discharged, your primary care physician will handle any further medical issues. Please note that NO REFILLS for any discharge medications will be authorized once you are discharged, as it is imperative that you return to your primary care physician (or establish a  relationship with a primary care physician if you do not have one) for your aftercare needs so that they can reassess your need for medications and monitor your lab values. Today   SUBJECTIVE   Some cough No cp  VITAL SIGNS:  Blood pressure 120/90, pulse 87, temperature (!) 97.4 F (36.3 C), temperature source Oral, resp. rate 16, height 5\' 7"  (1.702 m), weight 71.1 kg (156 lb 12.8 oz), SpO2 96 %.  I/O:    Intake/Output Summary (Last 24 hours) at 01/01/2017 1611 Last data filed at 01/01/2017 0700 Gross per 24 hour  Intake -  Output 400 ml  Net -400 ml    PHYSICAL EXAMINATION:   GENERAL:  83 y.o.-year-old patient lying in the bed with no acute distress.  EYES: Pupils equal, round, reactive to light and accommodation. No scleral icterus. Extraocular muscles intact.  HEENT: Head atraumatic, normocephalic. Oropharynx and nasopharynx clear.  NECK:  Supple, no jugular venous distention. No thyroid enlargement, no tenderness.  LUNGS: Normal breath sounds bilaterally, no wheezing,  rhonchi. No use of accessory muscles of respiration.  Few bibasilar crackles present CARDIOVASCULAR: S1, S2 normal. No murmurs, rubs, or gallops.  ABDOMEN: Soft, nontender, nondistended. Bowel sounds present. No organomegaly or mass.  EXTREMITIES: No cyanosis, clubbing or edema b/l.    NEUROLOGIC: Cranial nerves II through XII are intact. No focal Motor or sensory deficits b/l.   PSYCHIATRIC:  patient is alert and oriented x 3.  SKIN: No obvious rash, lesion, or ulcer.   DATA REVIEW:   CBC  Recent Labs  Lab 12/28/16 0426  WBC 6.7  HGB 11.0*  HCT 32.4*  PLT 74*    Chemistries  Recent Labs  Lab 12/29/16 0407  NA 137  K 3.7  CL 98*  CO2 29  GLUCOSE 138*  BUN 60*  CREATININE 1.29*  CALCIUM 7.8*    Microbiology Results   Recent Results (from the past 240 hour(s))  Urine Culture     Status: None   Collection Time: 12/23/16 12:43 PM  Result Value Ref Range Status   Specimen Description  URINE, CATHETERIZED  Final   Special Requests NONE  Final   Culture   Final    NO GROWTH Performed at [facility], [address] [place], [place] [address]    Report Status 12/24/2016 FINAL  Final    RADIOLOGY:  No results found.   Management plans discussed with the patient, family and they are in agreement.  CODE STATUS:     Code Status Orders  (From admission, onward)        Start     Ordered   12/21/16 1854  Do not attempt resuscitation (DNR)  Continuous    Question Answer Comment  In the event of cardiac or respiratory ARREST Do not call a "code blue"   In the event of cardiac or respiratory ARREST Do not perform Intubation, CPR, defibrillation or ACLS   In the event of cardiac or respiratory ARREST Use medication by any route, position, wound care, and other measures to relive pain and suffering. May use oxygen, suction and manual treatment of airway obstruction as needed for comfort.      12/21/16 1854    Code Status History    Date Active Date Inactive Code Status Order ID Comments User Context   12/19/2016 05:26 12/21/2016 18:54 Full Code [id number]  Saundra Shelling, MD Inpatient   11/20/2016 16:39 11/22/2016 19:50 Full Code [id number]  Idelle Crouch, MD ED    Advance Directive Documentation     Most Recent Value  Type of Advance Directive  Healthcare Power of Attorney  Pre-existing out of facility DNR order (yellow form or pink MOST form)  No data  "MOST" Form in Place?  No data      TOTAL TIME TAKING CARE OF THIS PATIENT: *40* minutes.    Fritzi Mandes M.D on 01/01/2017 at 4:11 PM  Between 7am to 6pm - Pager - [phone] After 6pm go to [email or web address] - password [email or web address] [facility]  Office  [phone]  CC: Primary care physician; Adin Hector, MD

## 2016-12-31 NOTE — Care Management Important Message (Signed)
Important Message  Patient Details  Name: Ebony Navarro MRN: [id number] Date of Birth: 02-09-1934   Medicare Important Message Given:  Yes    Katrina Stack, RN 12/31/2016, 9:46 AM

## 2016-12-31 NOTE — Clinical Social Work Note (Signed)
Patient's daughter has yet to arrive to hospital. CSW has called her to see what might be keeping her and CSW had to leave a message. Shela Leff MSW,LCSW [phone]

## 2016-12-31 NOTE — Progress Notes (Addendum)
Spoke with patient's daughter [name] at length in the room.  Daughter wants patient to go to [facility].  Patient does not have any bed offer for at [facility].  Daughter has left a message for the admissions coordinator at [facility] to see if any bed would open up hopefully in a day or 2.  Explained to daughter at length patient is best at baseline and is appropriate for discharge to rehab.  Daughter is not wanting patient to go at present since she is wanting her to go to [facility].  She does have bed offers at other facilities around in [place] which I told patient to look into.  I told her delaying rehab is not in patient's favor.  She voiced understanding.  She will let me know which facility she wants mom to be discharged to. Social worker is aware.

## 2016-12-31 NOTE — Clinical Social Work Note (Addendum)
Patient's daughter is not wanting her mother to go anywhere other than [facility] at discharge but is not wanting to take her home either. MD had a lengthy conversation with patient's daughter and is going to keep her until tomorrow morning per request of patient's daughter. Patient's daughter is thinking she will be able to obtain a bed at [facility] is still not able to take patient today or over the weekend. CSW has spoken to Seth Bake at [facility] and she has confirmed this. If patient's daughter desires a bed at [facility], there is a packet that [facility] will require her to fill out and then it will need to be reviewed as they do not accept private pay for rehab. They are not in network with [employer]. Patient's daughter has the list of bed offers.  Shela Leff MSW,LCSW [phone]

## 2016-12-31 NOTE — Care Management (Addendum)
CSW informed CM that patient's daughter is looking at facilities but she wants her mother to go to [facility].  CM has attempted to have a face to face discussion with daughter when she returned to discussed the possible need for [facility] for non covered continued stay if patient does not discharge 11/24.  CM informed that attending will discharge patient 11/24.  Patient is not able to  discuss or sign a notice of this matter. Approximate cost per day if does not discharge is 3750.00 dollars a day. Explained HINN to daughter Ebony Navarro and initiated a letter in [place] in the event it is needed.  Ebony Navarro verbalized her concerns that her mother has had a major medical set back today and she "might not" should discharge.  CM reviewed the medicare IM notice and process.  CM explained the difference between a HINN and Medicare appeal

## 2016-12-31 NOTE — Progress Notes (Signed)
PT Cancellation Note  Patient Details Name: Ebony Navarro MRN: [id number] DOB: [birth year]   Cancelled Treatment:    Reason Eval/Treat Not Completed: Other (comment). Pt currently in bathroom with RN. Pt declined to get in chair at this time. Will re-attempt next date.   Cheryll Keisler 12/31/2016, 3:29 PM Greggory Stallion, PT, DPT [phone]

## 2016-12-31 NOTE — Clinical Social Work Note (Addendum)
CSW informed by MD that she is ready to discharge patient. CSW contacted patient's daughter this morning via phone and patient's daughter wishes to speak with Dr. Posey Pronto as she wants to be comfortable that her mother is ready to discharge. CSW extended the bed offers and patient's daughter not happy that [facility] cannot offer but is aware she will need to decide on those that did offer. Patient's daughter arriving to hospital within the hour to speak with Dr. Posey Pronto. Shela Leff MSW,LCSW [phone]

## 2017-01-01 LAB — GLUCOSE, CAPILLARY
Glucose-Capillary: 132 mg/dL — ABNORMAL HIGH (ref 65–99)
Glucose-Capillary: 108 mg/dL — ABNORMAL HIGH (ref 65–99)

## 2017-01-01 MED ORDER — SENNOSIDES-DOCUSATE SODIUM 8.6-50 MG PO TABS
1.0000 | ORAL_TABLET | Freq: Once | ORAL | Status: DC
  Filled 2017-01-01: qty 1

## 2017-01-01 NOTE — Progress Notes (Signed)
Discharge teaching given to family and pt. At bedside. With teach back. Right chest porta cath de accessed. Report called to receiving facility [facility] spoke with Britta Mccreedy, RN. Transport called awaiting arrival

## 2017-01-01 NOTE — Progress Notes (Signed)
[facility]bony Navarro at [place] NAME: Ebony Navarro    MR#:  [id number]  DATE OF BIRTH:  November 30, 1933  SUBJECTIVE:   Patient appears weak has some cough No other acute issues per RN REVIEW OF SYSTEMS:   Review of Systems  Constitutional: Negative for chills, fever and weight loss.  HENT: Negative for ear discharge, ear pain and nosebleeds.   Eyes: Negative for blurred vision, pain and discharge.  Respiratory: Positive for cough. Negative for sputum production, shortness of breath, wheezing and stridor.   Cardiovascular: Negative for chest pain, palpitations, orthopnea and PND.  Gastrointestinal: Negative for abdominal pain, diarrhea, nausea and vomiting.  Genitourinary: Negative for frequency and urgency.  Musculoskeletal: Negative for back pain and joint pain.  Neurological: Positive for weakness. Negative for sensory change, speech change and focal weakness.  Psychiatric/Behavioral: Negative for depression and hallucinations. The patient is not nervous/anxious.    Tolerating Diet:yes Tolerating PT: STR  DRUG ALLERGIES:   Allergies  Allergen Reactions  . Ace Inhibitors Other (See Comments)    hyperkalemia  . Atenolol Other (See Comments)    Worsening palpitations  . Iodine Other (See Comments)    11/20/16: per conversation with pt, pt with allergy to topical iodine and betadine.  Pt states she has had IV contrast in the past with now issues.  . Metoprolol Tartrate Other (See Comments)    intolerant  . Omeprazole Diarrhea  . Povidone-Iodine Other (See Comments)  . Rofecoxib Nausea Only  . Ciprofloxacin Rash  . Doxycycline Calcium Rash  . Nitrofurantoin Rash  . Quinolones Rash  . Sulfa Antibiotics Rash    VITALS:  Blood pressure 112/70, pulse 95, temperature (!) 97.4 F (36.3 C), temperature source Oral, resp. rate 16, height 5\' 7"  (1.702 m), weight 71.1 kg (156 lb 12.8 oz), SpO2 99 %.  PHYSICAL EXAMINATION:   Physical  Exam  GENERAL:  83 y.o.-year-old patient lying in the bed with no acute distress.  EYES: Pupils equal, round, reactive to light and accommodation. No scleral icterus. Extraocular muscles intact.  HEENT: Head atraumatic, normocephalic. Oropharynx and nasopharynx clear.  NECK:  Supple, no jugular venous distention. No thyroid enlargement, no tenderness.  LUNGS:  Decreased breath sounds bilaterally, no wheezing, rales, rhonchi. No use of accessory muscles of respiration.  CARDIOVASCULAR: S1, S2 normal. No murmurs, rubs, or gallops.  ABDOMEN: Soft, nontender, nondistended. Bowel sounds present. No organomegaly or mass.  EXTREMITIES: No cyanosis, clubbing or edema b/l.    NEUROLOGIC: Cranial nerves II through XII are intact. No focal Motor or sensory deficits b/l.   PSYCHIATRIC:  patient is alert and oriented x 3.  SKIN: No obvious rash, lesion, or ulcer.   LABORATORY PANEL:  CBC Recent Labs  Lab 12/28/16 0426  WBC 6.7  HGB 11.0*  HCT 32.4*  PLT 74*    Chemistries  Recent Labs  Lab 12/29/16 0407  NA 137  K 3.7  CL 98*  CO2 29  GLUCOSE 138*  BUN 60*  CREATININE 1.29*  CALCIUM 7.8*   Cardiac Enzymes No results for input(s): TROPONINI in the last 168 hours. RADIOLOGY:  No results found. ASSESSMENT AND PLAN:  VirginiaFoglemanis a83 y.o.femalewith a known history of breast cancer, ovarian cancer, hyperlipidemia, hypertension presented to the emergency room with shortness of breath and chest pressure  1. Acute respiratory failure due to combination of aspiration pneumonia and acute systolic CWC:BJSEGBTD oxygen 2. Aspiration pneumonia status post treatment with antibiotics--completed 3. Acute systolic congestive heart failure  with severe aortic stenosisLV EF: 25% - 30%. -Patient has diuresed quite well in the last 2-3 days. Now on oral Lasix. 4. Upper GI bleed with hematemesis. Continue Protonix 5. AcuteNSTEMI. Unable to do heparin drip secondary to GI bleed.  restarted enteric-coated aspirin. Patient on low-dose metoprolol. Continue atorvastatin. Due to acute renal failure recent GI bleed not a good candidate for cardiac catheterization 6. 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 Ovarian cancer primary. Patient received her second chemotherapy on Friday. Wbc count now normal.Patient is followed by oncology recommends patient will need rehab and good performance status prior to resuming chemo. 7. Hyperlipidemia unspecified on atorvastatin 8. Hypothyroidism unspecified. On levothyroxine. 9. Pancytopenia.  received  Granix. WBC stable  Patient has been stable last couple days for discharge to rehab.  Social worker has attempted several times to call daughter left messages as well regarding discharge planning.  No answer from daughter. Social worker spoke with son in the room who tells that he is not involved in the discharge planning and leaving it up to his sister [name] to make that decision.  I had a lengthy discussion with patient's daughter yesterday.  She still wants to [facility] to [facility].  Social worker rechecked it yesterday late evening and this morning still no beds available at [facility].  Patient has other choices available  for rehab.   Case discussed with Care Management/Social Worker. Management plans discussed with the patient, family and they are in agreement.  CODE STATUS: DNR  DVT Prophylaxis: SCD  TOTAL TIME TAKING CARE OF THIS PATIENT: **25 minutes.  >50% time spent on counselling and coordination of care  Patient is ready for discharge.   Note: This dictation was prepared with Dragon dictation along with smaller phrase technology. Any transcriptional errors that result from this process are unintentional.  Fritzi Mandes M.D on 01/01/2017 at 12:46 PM  Between 7am to 6pm - Pager - [phone]  After 6pm go to [email or web address] - password [email or web address] [place] [facility]  Office   [phone]  CC: Primary care physician; Adin Hector, MD

## 2017-01-01 NOTE — Progress Notes (Signed)
PT Cancellation Note  Patient Details Name: [name] MRN: [id number] DOB: [birth year]   Cancelled Treatment:    Reason Eval/Treat Not Completed: Fatigue/lethargy limiting ability to participate. PT attempted treatment at 1342 with patient unable to keep eyes open with son at bedside. Will check back tomorrow if patient is more alert.   Dorice Lamas, PT, DPT, COMT 01/01/2017, 1:48 PM

## 2017-01-01 NOTE — Clinical Social Work Placement (Signed)
   CLINICAL SOCIAL WORK PLACEMENT  NOTE  Date:  01/01/2017  Patient Details  Name: Ebony Navarro MRN: [id number] Date of Birth: 1933-05-21  Clinical Social Work is seeking post-discharge placement for this patient at the [place] level of care (*CSW will initial, date and re-position this form in  chart as items are completed):  Yes   Patient/family provided with [place] Work Department's list of facilities offering this level of care within the geographic area requested by the patient (or if unable, by the patient's family).  Yes   Patient/family informed of their freedom to choose among providers that offer the needed level of care, that participate in Medicare, Medicaid or managed care program needed by the patient, have an available bed and are willing to accept the patient.  Yes   Patient/family informed of [place]'s ownership interest in [facility] and [facility], as well as of the fact that they are under no obligation to receive care at these facilities.  PASRR submitted to EDS on 12/27/16     PASRR number received on 12/27/16     Existing PASRR number confirmed on       FL2 transmitted to all facilities in geographic area requested by pt/family on 12/27/16     FL2 transmitted to all facilities within larger geographic area on       Patient informed that his/her managed care company has contracts with or will negotiate with certain facilities, including the following:            Patient/family informed of bed offers received.  Patient chooses bed at [facility]     Physician recommends and patient chooses bed at [facility]    Patient to be transferred to [facility] on 01/01/17.  Patient to be transferred to facility by EMS     Patient family notified on 01/01/17 of transfer.  Name of family member notified:  [name]     PHYSICIAN Please sign FL2, Please sign DNR       Additional Comment:    _______________________________________________ Zettie Pho, LCSW 01/01/2017, 5:58 PM

## 2017-01-01 NOTE — Progress Notes (Signed)
[facility] here for transport. Pt. Alert and oriented no distress noted. Family at bedside belongings sent with pt.

## 2017-01-01 NOTE — Progress Notes (Signed)
Pt. Has progressively gotten more confused over the night, multiple times she has attempted to get out of bed by herself, removing her oxygen, taking her clothes off, taking her electrodes off, taking her hospital bracelets off. She keeps saying she needs a bath to go to work. She also keeps stating she's strong and feels much better.  Pt. Re-oriented each time, redressed, tele reapplied, bracelets re-applied. Along with oxygen. Pt refused to wear bi-pap and was educated on the purpose of the oxygen. Pt has been awake most of the night. She was medicated twice for anxiety and pain. Will continue to monitor pt.

## 2017-01-01 NOTE — Clinical Social Work Note (Signed)
CSW met with the patient's daughter and son at bedside to discuss discharge planning. The patient's daughter expressed disappointment that [facility] are not discharge options. CSW provided emotional support concerning the decision making process for STR. The CSW provided active listening and answered all questions from the patient's daughter and son, including Medicare guidelines for coverage and how [facility] works with regards to discharge. The patient's daughter became tearful and thanked the CSW for information and support. The family has chosen [facility] for discharge today. The patient will discharge to room 714, and the RN can call report to the [facility] at [phone]. The patient will transport through non-emergent EMS on 2 L of o2. The CSW has sent all documentation to the facility. CSW will continue to follow for additional discharge facilitation.  Santiago Bumpers, MSW, Latanya Presser [phone]

## 2017-01-01 NOTE — Clinical Social Work Note (Addendum)
09:45 AM: CSW attempted to contact the patient's daughter to discuss discharge plan. The CSW left a HIPPA compliant voicemail requesting a call back to discuss discharge facilitation for today.   11:25 AM: CSW received call from attending RN that the patient's son has indicated that the patient's daughter would be making the final decisions regarding placement. The RN witnessed the patient's son speaking to the patient's daughter via telephone. CSW has again attempted to contact the patient's daughter, Ebony Navarro. CSW left another HIPPA compliant voicemail.   1:08 PM: CSW again attempted to contact the patient's daughter, Ebony Navarro. CSW left another HIPPA compliant voicemail. CSW is continuing to follow.  Santiago Bumpers, MSW, Latanya Presser [phone]

## 2017-01-03 ENCOUNTER — Inpatient Hospital Stay: Payer: Medicare Other

## 2017-01-03 ENCOUNTER — Inpatient Hospital Stay
Admission: EM | Admit: 2017-01-03 | Discharge: 2017-02-08 | DRG: 193 | Disposition: E | Payer: Medicare Other | Attending: Internal Medicine | Admitting: Internal Medicine

## 2017-01-03 ENCOUNTER — Emergency Department: Payer: Medicare Other

## 2017-01-03 ENCOUNTER — Other Ambulatory Visit: Payer: Self-pay

## 2017-01-03 DIAGNOSIS — N390 Urinary tract infection, site not specified: Secondary | ICD-10-CM | POA: Diagnosis not present

## 2017-01-03 DIAGNOSIS — I447 Left bundle-branch block, unspecified: Secondary | ICD-10-CM | POA: Diagnosis present

## 2017-01-03 DIAGNOSIS — J9600 Acute respiratory failure, unspecified whether with hypoxia or hypercapnia: Secondary | ICD-10-CM | POA: Diagnosis present

## 2017-01-03 DIAGNOSIS — K922 Gastrointestinal hemorrhage, unspecified: Secondary | ICD-10-CM

## 2017-01-03 DIAGNOSIS — J9601 Acute respiratory failure with hypoxia: Secondary | ICD-10-CM | POA: Diagnosis present

## 2017-01-03 DIAGNOSIS — R180 Malignant ascites: Secondary | ICD-10-CM | POA: Diagnosis not present

## 2017-01-03 DIAGNOSIS — Z888 Allergy status to other drugs, medicaments and biological substances status: Secondary | ICD-10-CM

## 2017-01-03 DIAGNOSIS — Z923 Personal history of irradiation: Secondary | ICD-10-CM

## 2017-01-03 DIAGNOSIS — Z66 Do not resuscitate: Secondary | ICD-10-CM

## 2017-01-03 DIAGNOSIS — R410 Disorientation, unspecified: Secondary | ICD-10-CM

## 2017-01-03 DIAGNOSIS — Z79890 Hormone replacement therapy (postmenopausal): Secondary | ICD-10-CM | POA: Diagnosis not present

## 2017-01-03 DIAGNOSIS — Z853 Personal history of malignant neoplasm of breast: Secondary | ICD-10-CM

## 2017-01-03 DIAGNOSIS — Z8543 Personal history of malignant neoplasm of ovary: Secondary | ICD-10-CM

## 2017-01-03 DIAGNOSIS — Z881 Allergy status to other antibiotic agents status: Secondary | ICD-10-CM

## 2017-01-03 DIAGNOSIS — I509 Heart failure, unspecified: Secondary | ICD-10-CM

## 2017-01-03 DIAGNOSIS — D696 Thrombocytopenia, unspecified: Secondary | ICD-10-CM

## 2017-01-03 DIAGNOSIS — R451 Restlessness and agitation: Secondary | ICD-10-CM | POA: Diagnosis not present

## 2017-01-03 DIAGNOSIS — Z7982 Long term (current) use of aspirin: Secondary | ICD-10-CM

## 2017-01-03 DIAGNOSIS — Z515 Encounter for palliative care: Secondary | ICD-10-CM | POA: Diagnosis not present

## 2017-01-03 DIAGNOSIS — Z9221 Personal history of antineoplastic chemotherapy: Secondary | ICD-10-CM | POA: Diagnosis not present

## 2017-01-03 DIAGNOSIS — G4700 Insomnia, unspecified: Secondary | ICD-10-CM | POA: Diagnosis not present

## 2017-01-03 DIAGNOSIS — J189 Pneumonia, unspecified organism: Secondary | ICD-10-CM | POA: Diagnosis not present

## 2017-01-03 DIAGNOSIS — N183 Chronic kidney disease, stage 3 (moderate): Secondary | ICD-10-CM | POA: Diagnosis present

## 2017-01-03 DIAGNOSIS — R042 Hemoptysis: Secondary | ICD-10-CM | POA: Diagnosis present

## 2017-01-03 DIAGNOSIS — I4891 Unspecified atrial fibrillation: Secondary | ICD-10-CM | POA: Diagnosis present

## 2017-01-03 DIAGNOSIS — E860 Dehydration: Secondary | ICD-10-CM | POA: Diagnosis not present

## 2017-01-03 DIAGNOSIS — I130 Hypertensive heart and chronic kidney disease with heart failure and stage 1 through stage 4 chronic kidney disease, or unspecified chronic kidney disease: Secondary | ICD-10-CM | POA: Diagnosis present

## 2017-01-03 DIAGNOSIS — D6959 Other secondary thrombocytopenia: Secondary | ICD-10-CM | POA: Diagnosis present

## 2017-01-03 DIAGNOSIS — Y95 Nosocomial condition: Secondary | ICD-10-CM | POA: Diagnosis present

## 2017-01-03 DIAGNOSIS — T451X5A Adverse effect of antineoplastic and immunosuppressive drugs, initial encounter: Secondary | ICD-10-CM | POA: Diagnosis present

## 2017-01-03 DIAGNOSIS — R443 Hallucinations, unspecified: Secondary | ICD-10-CM | POA: Diagnosis not present

## 2017-01-03 DIAGNOSIS — R0602 Shortness of breath: Secondary | ICD-10-CM | POA: Diagnosis not present

## 2017-01-03 DIAGNOSIS — Z8249 Family history of ischemic heart disease and other diseases of the circulatory system: Secondary | ICD-10-CM

## 2017-01-03 DIAGNOSIS — R7989 Other specified abnormal findings of blood chemistry: Secondary | ICD-10-CM

## 2017-01-03 DIAGNOSIS — D649 Anemia, unspecified: Secondary | ICD-10-CM | POA: Diagnosis not present

## 2017-01-03 DIAGNOSIS — G9340 Encephalopathy, unspecified: Secondary | ICD-10-CM

## 2017-01-03 DIAGNOSIS — I252 Old myocardial infarction: Secondary | ICD-10-CM

## 2017-01-03 DIAGNOSIS — I214 Non-ST elevation (NSTEMI) myocardial infarction: Secondary | ICD-10-CM | POA: Diagnosis not present

## 2017-01-03 DIAGNOSIS — E039 Hypothyroidism, unspecified: Secondary | ICD-10-CM | POA: Diagnosis present

## 2017-01-03 DIAGNOSIS — J9801 Acute bronchospasm: Secondary | ICD-10-CM | POA: Diagnosis present

## 2017-01-03 DIAGNOSIS — Z91041 Radiographic dye allergy status: Secondary | ICD-10-CM

## 2017-01-03 DIAGNOSIS — I5022 Chronic systolic (congestive) heart failure: Secondary | ICD-10-CM | POA: Diagnosis present

## 2017-01-03 DIAGNOSIS — E785 Hyperlipidemia, unspecified: Secondary | ICD-10-CM

## 2017-01-03 DIAGNOSIS — I10 Essential (primary) hypertension: Secondary | ICD-10-CM

## 2017-01-03 DIAGNOSIS — N179 Acute kidney failure, unspecified: Secondary | ICD-10-CM | POA: Diagnosis not present

## 2017-01-03 DIAGNOSIS — R079 Chest pain, unspecified: Secondary | ICD-10-CM

## 2017-01-03 DIAGNOSIS — Z882 Allergy status to sulfonamides status: Secondary | ICD-10-CM

## 2017-01-03 DIAGNOSIS — I959 Hypotension, unspecified: Secondary | ICD-10-CM | POA: Diagnosis not present

## 2017-01-03 DIAGNOSIS — Z7189 Other specified counseling: Secondary | ICD-10-CM

## 2017-01-03 DIAGNOSIS — C801 Malignant (primary) neoplasm, unspecified: Secondary | ICD-10-CM | POA: Diagnosis not present

## 2017-01-03 DIAGNOSIS — D709 Neutropenia, unspecified: Secondary | ICD-10-CM

## 2017-01-03 DIAGNOSIS — N289 Disorder of kidney and ureter, unspecified: Secondary | ICD-10-CM

## 2017-01-03 DIAGNOSIS — F419 Anxiety disorder, unspecified: Secondary | ICD-10-CM | POA: Diagnosis present

## 2017-01-03 DIAGNOSIS — C569 Malignant neoplasm of unspecified ovary: Secondary | ICD-10-CM | POA: Diagnosis present

## 2017-01-03 DIAGNOSIS — L8990 Pressure ulcer of unspecified site, unspecified stage: Secondary | ICD-10-CM

## 2017-01-03 DIAGNOSIS — Z79899 Other long term (current) drug therapy: Secondary | ICD-10-CM

## 2017-01-03 DIAGNOSIS — F0390 Unspecified dementia without behavioral disturbance: Secondary | ICD-10-CM | POA: Diagnosis present

## 2017-01-03 DIAGNOSIS — R339 Retention of urine, unspecified: Secondary | ICD-10-CM | POA: Diagnosis not present

## 2017-01-03 LAB — COMPREHENSIVE METABOLIC PANEL
ALT: 132 U/L — AB (ref 14–54)
ANION GAP: 14 (ref 5–15)
AST: 50 U/L — ABNORMAL HIGH (ref 15–41)
Albumin: 3.1 g/dL — ABNORMAL LOW (ref 3.5–5.0)
Alkaline Phosphatase: 91 U/L (ref 38–126)
BUN: 70 mg/dL — ABNORMAL HIGH (ref 6–20)
CHLORIDE: 99 mmol/L — AB (ref 101–111)
CO2: 28 mmol/L (ref 22–32)
CREATININE: 1.61 mg/dL — AB (ref 0.44–1.00)
Calcium: 8.8 mg/dL — ABNORMAL LOW (ref 8.9–10.3)
GFR, EST AFRICAN AMERICAN: 33 mL/min — AB (ref >60)
GFR, EST NON AFRICAN AMERICAN: 29 mL/min — AB (ref >60)
Glucose, Bld: 131 mg/dL — ABNORMAL HIGH (ref 65–99)
Potassium: 4.2 mmol/L (ref 3.5–5.1)
Sodium: 141 mmol/L (ref 135–145)
Total Bilirubin: 1.9 mg/dL — ABNORMAL HIGH (ref 0.3–1.2)
Total Protein: 6.9 g/dL (ref 6.5–8.1)

## 2017-01-03 LAB — CBC
HCT: 31.5 % — ABNORMAL LOW (ref 35.0–47.0)
Hemoglobin: 10.1 g/dL — ABNORMAL LOW (ref 12.0–16.0)
MCH: 30.3 pg (ref 26.0–34.0)
MCHC: 32 g/dL (ref 32.0–36.0)
MCV: 94.6 fL (ref 80.0–100.0)
PLATELETS: 103 10*3/uL — AB (ref 150–440)
RBC: 3.33 MIL/uL — ABNORMAL LOW (ref 3.80–5.20)
RDW: 17.6 % — AB (ref 11.5–14.5)
WBC: 5.2 10*3/uL (ref 3.6–11.0)

## 2017-01-03 LAB — TROPONIN I
TROPONIN I: 0.34 ng/mL — AB (ref <0.03)
TROPONIN I: 0.28 ng/mL — AB (ref <0.03)
Troponin I: 0.35 ng/mL (ref <0.03)
Troponin I: 0.28 ng/mL (ref <0.03)

## 2017-01-03 LAB — PROTIME-INR
INR: 1.24
PROTHROMBIN TIME: 15.5 s — AB (ref 11.4–15.2)

## 2017-01-03 LAB — LACTIC ACID, PLASMA
LACTIC ACID, VENOUS: 2 mmol/L — AB (ref 0.5–1.9)
Lactic Acid, Venous: 2.7 mmol/L (ref 0.5–1.9)

## 2017-01-03 LAB — APTT: aPTT: 32 seconds (ref 24–36)

## 2017-01-03 LAB — STREP PNEUMONIAE URINARY ANTIGEN: Strep Pneumo Urinary Antigen: NEGATIVE

## 2017-01-03 MED ORDER — IPRATROPIUM-ALBUTEROL 0.5-2.5 (3) MG/3ML IN SOLN
3.0000 mL | RESPIRATORY_TRACT | Status: DC | PRN
  Administered 2017-01-03: 3 mL via RESPIRATORY_TRACT
  Filled 2017-01-03: qty 3

## 2017-01-03 MED ORDER — VANCOMYCIN HCL IN DEXTROSE 1-5 GM/200ML-% IV SOLN
1000.0000 mg | Freq: Once | INTRAVENOUS | Status: AC
  Administered 2017-01-03: 1000 mg via INTRAVENOUS
  Filled 2017-01-03: qty 200

## 2017-01-03 MED ORDER — MELATONIN 5 MG PO TABS
5.0000 mg | ORAL_TABLET | Freq: Every day | ORAL | Status: DC
  Administered 2017-01-03 – 2017-01-09 (×7): 5 mg via ORAL
  Filled 2017-01-03 (×8): qty 1

## 2017-01-03 MED ORDER — NITROGLYCERIN 0.4 MG SL SUBL: 0.4000 mg | SUBLINGUAL_TABLET | SUBLINGUAL | Status: DC | PRN

## 2017-01-03 MED ORDER — GUAIFENESIN-DM 100-10 MG/5ML PO SYRP
5.0000 mL | ORAL_SOLUTION | ORAL | Status: DC | PRN
  Administered 2017-01-04 – 2017-01-05 (×2): 5 mL via ORAL
  Filled 2017-01-03 (×3): qty 5

## 2017-01-03 MED ORDER — PANTOPRAZOLE SODIUM 40 MG PO TBEC
40.0000 mg | DELAYED_RELEASE_TABLET | Freq: Every day | ORAL | Status: DC
  Administered 2017-01-03 – 2017-01-09 (×7): 40 mg via ORAL
  Filled 2017-01-03 (×7): qty 1

## 2017-01-03 MED ORDER — MEGESTROL ACETATE 20 MG PO TABS
20.0000 mg | ORAL_TABLET | Freq: Every day | ORAL | Status: DC
  Administered 2017-01-03 – 2017-01-10 (×8): 20 mg via ORAL
  Filled 2017-01-03 (×9): qty 1

## 2017-01-03 MED ORDER — ENOXAPARIN SODIUM 30 MG/0.3ML ~~LOC~~ SOLN
30.0000 mg | SUBCUTANEOUS | Status: DC
  Administered 2017-01-03 – 2017-01-04 (×2): 30 mg via SUBCUTANEOUS
  Filled 2017-01-03 (×2): qty 0.3

## 2017-01-03 MED ORDER — ENSURE ENLIVE PO LIQD
237.0000 mL | Freq: Two times a day (BID) | ORAL | Status: DC
  Administered 2017-01-04 – 2017-01-09 (×7): 237 mL via ORAL

## 2017-01-03 MED ORDER — FUROSEMIDE 10 MG/ML IJ SOLN
20.0000 mg | Freq: Once | INTRAMUSCULAR | Status: AC
  Administered 2017-01-03: 20 mg via INTRAVENOUS
  Filled 2017-01-03: qty 2

## 2017-01-03 MED ORDER — MIDODRINE HCL 5 MG PO TABS
5.0000 mg | ORAL_TABLET | Freq: Three times a day (TID) | ORAL | Status: DC
  Administered 2017-01-03 – 2017-01-10 (×20): 5 mg via ORAL
  Filled 2017-01-03 (×21): qty 1

## 2017-01-03 MED ORDER — AMIODARONE HCL 200 MG PO TABS
200.0000 mg | ORAL_TABLET | Freq: Two times a day (BID) | ORAL | Status: DC
  Administered 2017-01-03 – 2017-01-10 (×15): 200 mg via ORAL
  Filled 2017-01-03 (×16): qty 1

## 2017-01-03 MED ORDER — DEXTROSE 5 % IV SOLN
1.0000 g | Freq: Three times a day (TID) | INTRAVENOUS | Status: DC
  Administered 2017-01-03 – 2017-01-07 (×12): 1 g via INTRAVENOUS
  Filled 2017-01-03 (×14): qty 1

## 2017-01-03 MED ORDER — OXYCODONE-ACETAMINOPHEN 5-325 MG PO TABS
1.0000 | ORAL_TABLET | ORAL | Status: DC | PRN
  Administered 2017-01-04 – 2017-01-10 (×9): 1 via ORAL
  Filled 2017-01-03 (×12): qty 1

## 2017-01-03 MED ORDER — ATORVASTATIN CALCIUM 20 MG PO TABS
40.0000 mg | ORAL_TABLET | Freq: Every day | ORAL | Status: DC
  Administered 2017-01-03 – 2017-01-10 (×8): 40 mg via ORAL
  Filled 2017-01-03 (×9): qty 2

## 2017-01-03 MED ORDER — ASPIRIN EC 81 MG PO TBEC
81.0000 mg | DELAYED_RELEASE_TABLET | Freq: Every day | ORAL | Status: DC
  Administered 2017-01-03 – 2017-01-10 (×8): 81 mg via ORAL
  Filled 2017-01-03 (×8): qty 1

## 2017-01-03 MED ORDER — LEVOTHYROXINE SODIUM 50 MCG PO TABS
50.0000 ug | ORAL_TABLET | Freq: Every day | ORAL | Status: DC
  Administered 2017-01-04 – 2017-01-10 (×7): 50 ug via ORAL
  Filled 2017-01-03 (×8): qty 1

## 2017-01-03 MED ORDER — SODIUM CHLORIDE 0.9 % IV SOLN
INTRAVENOUS | Status: AC
  Administered 2017-01-03 (×2): via INTRAVENOUS

## 2017-01-03 MED ORDER — FUROSEMIDE 20 MG PO TABS
20.0000 mg | ORAL_TABLET | Freq: Every day | ORAL | Status: DC
  Administered 2017-01-04 – 2017-01-09 (×6): 20 mg via ORAL
  Filled 2017-01-03 (×7): qty 1

## 2017-01-03 MED ORDER — ALPRAZOLAM 0.25 MG PO TABS
0.2500 mg | ORAL_TABLET | Freq: Every evening | ORAL | Status: DC | PRN
  Administered 2017-01-03 (×2): 0.25 mg via ORAL
  Filled 2017-01-03 (×2): qty 1

## 2017-01-03 MED ORDER — ONDANSETRON HCL 4 MG PO TABS: 8.0000 mg | ORAL_TABLET | Freq: Two times a day (BID) | ORAL | Status: DC | PRN

## 2017-01-03 MED ORDER — VANCOMYCIN HCL IN DEXTROSE 750-5 MG/150ML-% IV SOLN
750.0000 mg | INTRAVENOUS | Status: DC
  Administered 2017-01-04 – 2017-01-05 (×2): 750 mg via INTRAVENOUS
  Filled 2017-01-03 (×4): qty 150

## 2017-01-03 MED ORDER — DEXTROSE 5 % IV SOLN
1.0000 g | Freq: Once | INTRAVENOUS | Status: AC
  Administered 2017-01-03: 1 g via INTRAVENOUS
  Filled 2017-01-03: qty 1

## 2017-01-03 MED ORDER — ADULT MULTIVITAMIN W/MINERALS CH
1.0000 | ORAL_TABLET | Freq: Every day | ORAL | Status: DC
  Administered 2017-01-03 – 2017-01-10 (×8): 1 via ORAL
  Filled 2017-01-03 (×8): qty 1

## 2017-01-03 NOTE — ED Notes (Signed)
Report to megan, rn.  

## 2017-01-03 NOTE — ED Notes (Signed)
Pt's caregiver sitting in room with patient. Reports last night began smacking her chest repeatedly prior to calling 911. Pt's caregiver also reports hallucinations, states that patient was asking about a baby, stating she needed to go to work, and talking about "p[place]paw", caregiver is unsure of who pawpaw is. Caregiver reports last night was 1st shift they had with patient. Pt continues to be on NRB at 6L per minute, pt is also noted to be tachypneic on assessment.

## 2017-01-03 NOTE — ED Notes (Signed)
Right chest port accessed by vanessa, rn, 20g huber one inch needle.

## 2017-01-03 NOTE — ED Notes (Signed)
Pt placed on a nonrebreather at 15lpm for pox of 78% on 6lpm oxygen.

## 2017-01-03 NOTE — Progress Notes (Signed)
Pharmacy Antibiotic Note  Ebony Navarro is a 83 y.o. female admitted on 12/23/2016 with pneumonia.  Pharmacy has been consulted for vancomycin dosing.  Plan: Vancomycin 750mg   IV every 24 hours.  Goal trough 15-20 mcg/mL. Vancomycin trough before 4th dose, 11/29@1730   Height: 5\' 6"  (167.6 cm) Weight: 156 lb 1.6 oz (70.8 kg) IBW/kg (Calculated) : 59.3  No data recorded.  Recent Labs  Lab 12/28/16 0426 12/29/16 0407 01/02/2017 0636 12/28/2016 0745 01/02/2017 1023  WBC 6.7  --  5.2  --   --   CREATININE  --  1.29* 1.61*  --   --   LATICACIDVEN  --   --   --  2.0* 2.7*    Estimated Creatinine Clearance: 25.7 mL/min (A) (by C-G formula based on SCr of 1.61 mg/dL (H)).    Allergies  Allergen Reactions  . Ace Inhibitors Other (See Comments)    hyperkalemia  . Atenolol Other (See Comments)    Worsening palpitations  . Iodine Other (See Comments)    11/20/16: per conversation with pt, pt with allergy to topical iodine and betadine.  Pt states she has had IV contrast in the past with now issues.  . Metoprolol Tartrate Other (See Comments)    intolerant  . Omeprazole Diarrhea  . Povidone-Iodine Other (See Comments)  . Rofecoxib Nausea Only  . Ciprofloxacin Rash  . Doxycycline Calcium Rash  . Nitrofurantoin Rash  . Quinolones Rash  . Sulfa Antibiotics Rash    Antimicrobials this admission: Anti-infectives (From admission, onward)   Start     Dose/Rate Route Frequency Ordered Stop   12/22/2016 1800  vancomycin (VANCOCIN) IVPB 750 mg/150 ml premix     750 mg 150 mL/hr over 60 Minutes Intravenous Every 24 hours 12/10/2016 1157     01/04/2017 1400  ceFEPIme (MAXIPIME) 1 g in dextrose 5 % 50 mL IVPB     1 g 100 mL/hr over 30 Minutes Intravenous Every 8 hours 12/11/2016 1035 02-01-17 1359   01/07/2017 0745  vancomycin (VANCOCIN) IVPB 1000 mg/200 mL premix     1,000 mg 200 mL/hr over 60 Minutes Intravenous  Once 01/01/2017 0744 12/10/2016 0955   12/09/2016 0745  ceFEPIme (MAXIPIME) 1 g in  dextrose 5 % 50 mL IVPB     1 g 100 mL/hr over 30 Minutes Intravenous  Once 01/05/2017 0744 12/30/2016 0853     Microbiology results: No results found for this or any previous visit (from the past 240 hour(s)).  Thank you for allowing pharmacy to be a part of this patient's care.  Donna Christen Domonique Brouillard 12/10/2016 11:57 AM

## 2017-01-03 NOTE — ED Provider Notes (Signed)
[facility] Emergency Department Provider Note   ____________________________________________   First MD Initiated Contact with Patient 12/20/2016 0631     (approximate)  I have reviewed the triage vital signs and the nursing notes.   HISTORY  Chief Complaint Chest Pain    HPI Ebony Navarro is a 83 y.o. female who comes into the hospital today with some chest pain and shortness of breath.  The patient was discharged from the hospital yesterday after a prolonged stay for aspiration pneumonia, congestive heart failure, and NSTEMI.  The patient called out tonight stating that she had chest pain.  According to the patient's caregiver she was smacking her chest and saying that she was dying.  She stated that she could not breathe and she needed some help.  The patient does wear oxygen regularly.  The caregiver states that she was coughing up blood as well.  She has a history of ovarian cancer stage IV.  The patient states that the chest pain started a week ago.  EMS states that she has some confusion but they are unsure of her baseline.  The patient denied to me that she was short of breath and she states that her pain is currently a 3 out of 10 in intensity.  She denies any radiation of her pain and denies any nausea or vomiting.  The patient was brought in for evaluation.   Past Medical History:  Diagnosis Date  . Breast cancer ([place]) 2005   Right, radiation and lumpectomy  . Breast cancer ([place]) 2002   Left, Chemo, radiation and lumpectomy  . Cancer ([place])   . Hyperlipidemia   . Hypertension   . Ovarian cancer ([place]) 11/24/2016    Patient Active Problem List   Diagnosis Date Noted  . Acute respiratory failure ([place]) 12/22/2016  . Neutropenia, drug-induced ([place])   . Thrombocytopenia ([place])   . Acute respiratory failure with hypoxemia ([place])   . Shortness of breath   . NSTEMI (non-ST elevated myocardial infarction) ([place]) 12/19/2016  . Ovarian cancer ([place])  11/24/2016  . Malignant neoplasm of ovary ([place]) 11/24/2016  . Abdominal pain 11/20/2016  . Ascites 11/20/2016  . Ovarian mass 11/20/2016  . Absolute anemia 01/22/2015  . Anxiety 01/22/2015  . Malignant neoplasm of breast ([place]) 01/22/2015  . Clinical depression 01/22/2015  . Gastric catarrh 01/22/2015  . Blood glucose elevated 01/22/2015  . HLD (hyperlipidemia) 01/22/2015  . BP (high blood pressure) 01/22/2015  . Acquired atrophy of thyroid 04/02/2014  . Frequent UTI 11/23/2013  . Chronic kidney disease (CKD), stage III (moderate) ([place]) 10/02/2013    Past Surgical History:  Procedure Laterality Date  . BREAST BIOPSY Left 2010   negative stereotactic biopsy  . BREAST LUMPECTOMY Right 2005   positive  . BREAST LUMPECTOMY Left 2002   positive  . PORTA CATH INSERTION N/A 12/09/2016   Procedure: PORTA CATH INSERTION;  Surgeon: Algernon Huxley, MD;  Location: [facility];  Service: Cardiovascular;  Laterality: N/A;    Prior to Admission medications   Medication Sig Start Date End Date Taking? Authorizing Provider  amiodarone (PACERONE) 200 MG tablet Take 1 tablet (200 mg total) by mouth 2 (two) times daily. 12/31/16  Yes Fritzi Mandes, MD  aspirin EC 81 MG EC tablet Take 1 tablet (81 mg total) by mouth daily. 12/31/16  Yes Fritzi Mandes, MD  atorvastatin (LIPITOR) 40 MG tablet Take 40 mg by mouth daily.    Yes [provider]  dexamethasone (DECADRON)  4 MG tablet Take 8 mg daily by mouth. Start the day after chemotherapy for 2 days   Yes [provider]  feeding supplement, ENSURE ENLIVE, (ENSURE ENLIVE) LIQD Take 237 mLs by mouth 2 (two) times daily between meals. 12/31/16  Yes Fritzi Mandes, MD  furosemide (LASIX) 20 MG tablet Take 1 tablet (20 mg total) by mouth daily. 12/31/16  Yes Fritzi Mandes, MD  levothyroxine (SYNTHROID, LEVOTHROID) 50 MCG tablet TAKE 1 TABLET BY MOUTH DAILY ON AN EMPTY STOMACH WITH A GLASS OF WATER 30-60 MIN BEFORE BREAKFAST 11/11/14  Yes  [provider]  megestrol (MEGACE) 20 MG tablet Take 1 tablet (20 mg total) by mouth daily. 11/30/16  Yes Burns, Wandra Feinstein, NP  Melatonin 5 MG TABS Take 1 tablet (5 mg total) by mouth at bedtime. 12/31/16  Yes Fritzi Mandes, MD  midodrine (PROAMATINE) 5 MG tablet Take 1 tablet (5 mg total) by mouth 3 (three) times daily with meals. 12/31/16  Yes Fritzi Mandes, MD  Multiple Vitamin (MULTIVITAMIN WITH MINERALS) TABS tablet Take 1 tablet by mouth daily. 12/31/16  Yes Fritzi Mandes, MD  nitroGLYCERIN (NITROSTAT) 0.4 MG SL tablet Place 1 tablet (0.4 mg total) under the tongue every 5 (five) minutes x 3 doses as needed for chest pain. 12/31/16  Yes Fritzi Mandes, MD  pantoprazole (PROTONIX) 40 MG tablet Take 1 tablet (40 mg total) by mouth daily at 12 noon. 12/31/16  Yes Fritzi Mandes, MD  ALPRAZolam Duanne Moron) 0.25 MG tablet Take 1 tablet (0.25 mg total) by mouth at bedtime as needed for anxiety. 12/31/16   Fritzi Mandes, MD  guaiFENesin-dextromethorphan [facility] DM) 100-10 MG/5ML syrup Take 5 mLs by mouth every 4 (four) hours as needed for cough. 12/31/16   Fritzi Mandes, MD  ipratropium-albuterol (DUONEB) 0.5-2.5 (3) MG/3ML SOLN Take 3 mLs by nebulization every 4 (four) hours as needed. 12/31/16   Fritzi Mandes, MD  ondansetron (ZOFRAN) 8 MG tablet Take 1 tablet (8 mg total) by mouth 2 (two) times daily as needed for refractory nausea / vomiting. Start on day 3 after chemo. 11/26/16   Earlie Server, MD  promethazine (PHENERGAN) 25 MG tablet Take 1 tablet (25 mg total) by mouth every 8 (eight) hours as needed for nausea or vomiting. 12/31/16   Fritzi Mandes, MD    Allergies Ace inhibitors; Atenolol; Iodine; Metoprolol tartrate; Omeprazole; Povidone-iodine; Rofecoxib; Ciprofloxacin; Doxycycline calcium; Nitrofurantoin; Quinolones; and Sulfa antibiotics  Family History  Problem Relation Age of Onset  . Hypertension Mother   . Hypertension Father     Social History Social History   Tobacco Use  . Smoking  status: Never Smoker  . Smokeless tobacco: Never Used  Substance Use Topics  . Alcohol use: No  . Drug use: No    Review of Systems  Constitutional: No fever/chills Eyes: No visual changes. ENT: No sore throat. Cardiovascular:  chest pain. Respiratory:  shortness of breath. Gastrointestinal: No abdominal pain.  No nausea, no vomiting.  No diarrhea.  No constipation. Genitourinary: Negative for dysuria. Musculoskeletal: Negative for back pain. Skin: Negative for rash. Neurological: Negative for headaches, focal weakness or numbness.   ____________________________________________   PHYSICAL EXAM:  VITAL SIGNS: ED Triage Vitals  Enc Vitals Group     BP 01/06/2017 0635 111/60     Pulse Rate 01/01/2017 0635 91     Resp 12/27/2016 0635 (!) 24     Temp --      Temp src --      SpO2 12/26/2016 0635 100 %  Weight 12/14/2016 0637 163 lb 2.3 oz (74 kg)     Height --      Head Circumference --      [facility] Flow --      Pain Score 12/17/2016 0634 3     Pain Loc --      Pain Edu? --      Excl. in [place]? --     Constitutional: Somnolent but arousable. Ill appearing and in mild distress. Eyes: Conjunctivae are normal. PERRL. EOMI. Head: Atraumatic. Nose: No congestion/rhinnorhea. Mouth/Throat: Mucous membranes are moist.  Oropharynx non-erythematous. Cardiovascular: Normal rate, regular rhythm. Grossly normal heart sounds.  Good peripheral circulation. Respiratory: Normal respiratory effort.  No retractions. Lungs CTAB. Gastrointestinal: Soft and nontender. No distention.  Positive bowel sounds Musculoskeletal: No lower extremity tenderness nor edema.   Neurologic:  Normal speech and language.  Skin:  Skin is warm, dry and intact.  Psychiatric: Mood and affect are normal.   ____________________________________________   LABS (all labs ordered are listed, but only abnormal results are displayed)  Labs Reviewed  CBC - Abnormal; Notable for the following components:      Result Value     RBC 3.33 (*)    Hemoglobin 10.1 (*)    HCT 31.5 (*)    RDW 17.6 (*)    Platelets 103 (*)    All other components within normal limits  COMPREHENSIVE METABOLIC PANEL - Abnormal; Notable for the following components:   Chloride 99 (*)    Glucose, Bld 131 (*)    BUN 70 (*)    Creatinine, Ser 1.61 (*)    Calcium 8.8 (*)    Albumin 3.1 (*)    AST 50 (*)    ALT 132 (*)    Total Bilirubin 1.9 (*)    GFR calc non Af Amer 29 (*)    GFR calc Af Amer 33 (*)    All other components within normal limits  TROPONIN I - Abnormal; Notable for the following components:   Troponin I 0.34 (*)    All other components within normal limits  PROTIME-INR - Abnormal; Notable for the following components:   Prothrombin Time 15.5 (*)    All other components within normal limits  CULTURE, BLOOD (ROUTINE X 2)  CULTURE, BLOOD (ROUTINE X 2)  APTT  LACTIC ACID, PLASMA  LACTIC ACID, PLASMA   ____________________________________________  EKG  ED ECG REPORT I, Loney Hering, the attending physician, personally viewed and interpreted this ECG.   Date: 12/31/2016  EKG Time: 634  Rate: 90  Rhythm: normal sinus rhythm, LBBB  Axis: left axis deviation  Intervals:left bundle branch block  ST&T Change: 1 box elevation in lead V1, V2, V3  ____________________________________________  RADIOLOGY  Dg Chest Portable 1 View  Result Date: 12/09/2016 CLINICAL DATA:  83 year old female with chest pain. Personal history of MI 2 weeks ago. EXAM: PORTABLE CHEST 1 VIEW COMPARISON:  Portable chest 12/26/2016 and earlier. FINDINGS: Portable AP semi upright view at at 0636 hours. Stable right chest porta cath. Stable cardiomegaly and mediastinal contours. Visualized tracheal air column is within normal limits. No pneumothorax. Veiling opacity at both lung bases continues and appears mildly regressed on the left. However, there is increased bilateral suprahilar streaky pulmonary opacity. Elsewhere pulmonary  vascularity appear stable. Paucity bowel gas in the upper abdomen. No acute osseous abnormality identified. IMPRESSION: 1. Increased bilateral perihilar upper lobe opacity. Consider multifocal infection and acute pulmonary edema. Aspiration is felt less likely. 2. Continued  bilateral pleural effusions, small and possibly regressed on the left. 3. Stable cardiomegaly. Electronically Signed   By: Genevie Ann M.D.   On: 12/13/2016 07:02    ____________________________________________   PROCEDURES  Procedure(s) performed: None  Procedures  Critical Care performed: No  ____________________________________________   INITIAL IMPRESSION / ASSESSMENT AND PLAN / ED COURSE  As part of my medical decision making, I reviewed the following data within the electronic MEDICAL RECORD NUMBER Notes from prior ED visits and [place] Controlled Substance Database   This is an 83 year old female who was brought in from her nursing facility with some chest pain shortness of breath and coughing up blood.  The patient was just discharged from the hospital yesterday.  She has been having chest pain for 3 days.  The patient does appear to have some EKG changes.  Her previous EKG showed some ST depressions in lead V2 and now she does have some mild elevations and what appears to be a left bundle branch block.  The patient though was not a good candidate for heparin previously for a GI bleed and has a very complex medical history.  We will await the results of the patient's blood work since this pain has been consistent.  The patient will receive a chest x-ray and will be reassessed.    The patient will be admitted to the hospitalist service.  She appears to have some multifocal pneumonia.  She will receive some vancomycin and cefepime.  ____________________________________________   FINAL CLINICAL IMPRESSION(S) / ED DIAGNOSES  Final diagnoses:  Chest pain, unspecified type  Shortness of breath  Multifocal pneumonia      ED Discharge Orders    None       Note:  This document was prepared using Dragon voice recognition software and may include unintentional dictation errors.    Loney Hering, MD 12/11/2016 [phone]

## 2017-01-03 NOTE — ED Notes (Signed)
Date and time results received: 12/25/2016 0708   Test: Troponin Critical Value: 0.34  Name of Provider Notified: Dr. Dahlia Client  Orders Received? Or Actions Taken?: Critical Results Acknowledged

## 2017-01-03 NOTE — ED Notes (Signed)
Date and time results received: 01/01/2017 9:14 AM   Test: Lactic Critical Value: 2.0  Name of Provider Notified: Dr. Margaretmary Eddy  Orders Received? Or Actions Taken?: Orders Received - See Orders for details

## 2017-01-03 NOTE — H&P (Signed)
[facility] [place] NAME: Ebony Navarro    MR#:  [id number]  DATE OF BIRTH:  16-May-1933  DATE OF ADMISSION:  12/13/2016  PRIMARY CARE PHYSICIAN: Adin Hector, MD   REQUESTING/REFERRING PHYSICIAN: 01/01/2017  CHIEF COMPLAINT:   sob HISTORY OF PRESENT ILLNESS:  Ebony Navarro  is a 83 y.o. female with a known history of breast cancer, recent history of ovarian cancer, on chemotherapy, recent history of non-STEMI, pneumonia discharged to rehab center is presenting to the ED with a chief complaint of worsening of shortness of breath.  Patient was placed on nonrebreather in the ED because she was hypoxemic.  Chest x-ray has revealed multifocal pneumonia with some pleural effusion.  She was placed on empiric IV antibiotics and hospitalist team was called to admit the patient.  Patient is also altered from her baseline dementia according to the   son at bedside.  PAST MEDICAL HISTORY:   Past Medical History:  Diagnosis Date  . Breast cancer ([place]) 2005   Right, radiation and lumpectomy  . Breast cancer ([place]) 2002   Left, Chemo, radiation and lumpectomy  . Cancer ([place])   . Hyperlipidemia   . Hypertension   . Ovarian cancer ([place]) 11/24/2016    PAST SURGICAL HISTOIRY:   Past Surgical History:  Procedure Laterality Date  . BREAST BIOPSY Left 2010   negative stereotactic biopsy  . BREAST LUMPECTOMY Right 2005   positive  . BREAST LUMPECTOMY Left 2002   positive  . PORTA CATH INSERTION N/A 12/09/2016   Procedure: PORTA CATH INSERTION;  Surgeon: Algernon Huxley, MD;  Location: [facility];  Service: Cardiovascular;  Laterality: N/A;    SOCIAL HISTORY:   Social History   Tobacco Use  . Smoking status: Never Smoker  . Smokeless tobacco: Never Used  Substance Use Topics  . Alcohol use: No    FAMILY HISTORY:   Family History  Problem Relation Age of Onset  . Hypertension Mother   . Hypertension Father      DRUG ALLERGIES:   Allergies  Allergen Reactions  . Ace Inhibitors Other (See Comments)    hyperkalemia  . Atenolol Other (See Comments)    Worsening palpitations  . Iodine Other (See Comments)    11/20/16: per conversation with pt, pt with allergy to topical iodine and betadine.  Pt states she has had IV contrast in the past with now issues.  . Metoprolol Tartrate Other (See Comments)    intolerant  . Omeprazole Diarrhea  . Povidone-Iodine Other (See Comments)  . Rofecoxib Nausea Only  . Ciprofloxacin Rash  . Doxycycline Calcium Rash  . Nitrofurantoin Rash  . Quinolones Rash  . Sulfa Antibiotics Rash    REVIEW OF SYSTEMS:  Review of system unobtainable as the patient is delirious  MEDICATIONS AT HOME:   Prior to Admission medications   Medication Sig Start Date End Date Taking? Authorizing Provider  amiodarone (PACERONE) 200 MG tablet Take 1 tablet (200 mg total) by mouth 2 (two) times daily. 12/31/16  Yes Fritzi Mandes, MD  aspirin EC 81 MG EC tablet Take 1 tablet (81 mg total) by mouth daily. 12/31/16  Yes Fritzi Mandes, MD  atorvastatin (LIPITOR) 40 MG tablet Take 40 mg by mouth daily.    Yes [provider]  dexamethasone (DECADRON) 4 MG tablet Take 8 mg daily by mouth. Start the day after chemotherapy for 2 days   Yes [provider]  feeding  supplement, ENSURE ENLIVE, (ENSURE ENLIVE) LIQD Take 237 mLs by mouth 2 (two) times daily between meals. 12/31/16  Yes Fritzi Mandes, MD  furosemide (LASIX) 20 MG tablet Take 1 tablet (20 mg total) by mouth daily. 12/31/16  Yes Fritzi Mandes, MD  levothyroxine (SYNTHROID, LEVOTHROID) 50 MCG tablet TAKE 1 TABLET BY MOUTH DAILY ON AN EMPTY STOMACH WITH A GLASS OF WATER 30-60 MIN BEFORE BREAKFAST 11/11/14  Yes [provider]  megestrol (MEGACE) 20 MG tablet Take 1 tablet (20 mg total) by mouth daily. 11/30/16  Yes Burns, Wandra Feinstein, NP  Melatonin 5 MG TABS Take 1 tablet (5 mg total) by mouth at bedtime. 12/31/16   Yes Fritzi Mandes, MD  midodrine (PROAMATINE) 5 MG tablet Take 1 tablet (5 mg total) by mouth 3 (three) times daily with meals. 12/31/16  Yes Fritzi Mandes, MD  Multiple Vitamin (MULTIVITAMIN WITH MINERALS) TABS tablet Take 1 tablet by mouth daily. 12/31/16  Yes Fritzi Mandes, MD  nitroGLYCERIN (NITROSTAT) 0.4 MG SL tablet Place 1 tablet (0.4 mg total) under the tongue every 5 (five) minutes x 3 doses as needed for chest pain. 12/31/16  Yes Fritzi Mandes, MD  pantoprazole (PROTONIX) 40 MG tablet Take 1 tablet (40 mg total) by mouth daily at 12 noon. 12/31/16  Yes Fritzi Mandes, MD  ALPRAZolam Duanne Moron) 0.25 MG tablet Take 1 tablet (0.25 mg total) by mouth at bedtime as needed for anxiety. 12/31/16   Fritzi Mandes, MD  guaiFENesin-dextromethorphan [facility] DM) 100-10 MG/5ML syrup Take 5 mLs by mouth every 4 (four) hours as needed for cough. 12/31/16   Fritzi Mandes, MD  ipratropium-albuterol (DUONEB) 0.5-2.5 (3) MG/3ML SOLN Take 3 mLs by nebulization every 4 (four) hours as needed. 12/31/16   Fritzi Mandes, MD  ondansetron (ZOFRAN) 8 MG tablet Take 1 tablet (8 mg total) by mouth 2 (two) times daily as needed for refractory nausea / vomiting. Start on day 3 after chemo. 11/26/16   Earlie Server, MD  promethazine (PHENERGAN) 25 MG tablet Take 1 tablet (25 mg total) by mouth every 8 (eight) hours as needed for nausea or vomiting. 12/31/16   Fritzi Mandes, MD      VITAL SIGNS:  Blood pressure 102/80, pulse 93, resp. rate 18, height 5\' 6"  (1.676 m), weight 70.8 kg (156 lb 1.6 oz), SpO2 91 %.  PHYSICAL EXAMINATION:  GENERAL:  83 y.o.-year-old patient lying in the bed with no acute distress.  EYES: Pupils equal, round, reactive to light and accommodation. No scleral icterus. Extraocular muscles intact.  HEENT: Head atraumatic, normocephalic. Oropharynx and nasopharynx clear.  NECK:  Supple, no jugular venous distention. No thyroid enlargement, no tenderness.  LUNGS:  moderately diminished breath sounds bilaterally, no  wheezing, rales,rhonchi, has diffuse crepitation. No use of accessory muscles of respiration.  CARDIOVASCULAR: S1, S2 normal. No murmurs, rubs, or gallops.  ABDOMEN: Soft, nontender, nondistended. Bowel sounds present. No organomegaly or mass.  EXTREMITIES: No pedal edema, cyanosis, or clubbing.  NEUROLOGIC: Delirious but spontaneously moving all her extremities PSYCHIATRIC: The patient is delirious SKIN: No obvious rash, lesion, or ulcer.   LABORATORY PANEL:   CBC Recent Labs  Lab 12/21/2016 0636  WBC 5.2  HGB 10.1*  HCT 31.5*  PLT 103*   ------------------------------------------------------------------------------------------------------------------  Chemistries  Recent Labs  Lab 01/04/2017 0636  NA 141  K 4.2  CL 99*  CO2 28  GLUCOSE 131*  BUN 70*  CREATININE 1.61*  CALCIUM 8.8*  AST 50*  ALT 132*  ALKPHOS 91  BILITOT 1.9*   ------------------------------------------------------------------------------------------------------------------  Cardiac Enzymes Recent Labs  Lab 01/02/2017 1023  TROPONINI 0.35*   ------------------------------------------------------------------------------------------------------------------  RADIOLOGY:  Ct Chest Wo Contrast  Result Date: 12/22/2016 CLINICAL DATA:  83 year old female with history of chest pain and shortness of breath. Recently discharged from the hospital after treatment for aspiration pneumonia. Hemoptysis. Additional history of stage IV ovarian cancer. EXAM: CT CHEST WITHOUT CONTRAST TECHNIQUE: Multidetector CT imaging of the chest was performed following the standard protocol without IV contrast. COMPARISON:  Chest CT 12/19/2016. FINDINGS: Cardiovascular: Heart size is mildly enlarged. There is no significant pericardial fluid, thickening or pericardial calcification. There is aortic atherosclerosis, as well as atherosclerosis of the great vessels of the mediastinum and the coronary arteries, including calcified  atherosclerotic plaque in the left main, left anterior descending and right coronary arteries. Severe calcifications of the aortic valve. Mild calcifications of the mitral annulus. Right internal jugular single-lumen porta cath with tip terminating in the mid superior vena cava. Mediastinum/Nodes: No pathologically enlarged mediastinal or hilar lymph nodes. Please note that accurate exclusion of hilar adenopathy is limited on noncontrast CT scans. Esophagus is unremarkable in appearance. No axillary lymphadenopathy. Lungs/Pleura: Compared to the prior examination there has been considerable worsening of patchy multifocal asymmetrically distributed areas of ground-glass attenuation, severe thickening of the peribronchovascular interstitium and regions of more confluent peribronchovascular airspace consolidation with some associated septal thickening and regional areas of architectural distortion. There is relative sparing of the periphery of the lungs. Dependent subsegmental atelectasis in the right lower lobe. Moderate right and small left pleural effusions lying dependently. Upper Abdomen: Aortic atherosclerosis. 3.4 x 2.4 cm low-attenuation lesion extending from the tail of the pancreas, incompletely characterized on today's noncontrast CT examination but similar to prior contrast enhanced CT of the abdomen and pelvis dated 06/21/2013, likely a pancreatic pseudocyst or other benign lesion. Amorphous intermediate attenuation material lying dependently in the gallbladder likely reflects biliary sludge. Musculoskeletal: There are no aggressive appearing lytic or blastic lesions noted in the visualized portions of the skeleton. IMPRESSION: 1. There is a spectrum of findings in the lungs, as above, which is most compatible with either severe multilobar bronchopneumonia, or inflammatory process such as cryptogenic organizing pneumonia (COP). 2. Moderate right and small left pleural effusions lying dependently with some  associated passive subsegmental atelectasis in the dependent portion of the right lower lobe. 3. Mild cardiomegaly. 4. Aortic atherosclerosis, in addition to left main and 2 vessel coronary artery disease. 5. There are calcifications of the aortic valve. Echocardiographic correlation for evaluation of potential valvular dysfunction may be warranted if clinically indicated. 6. Additional incidental findings, as above. Aortic Atherosclerosis (ICD10-I70.0). Electronically Signed   By: Vinnie Langton M.D.   On: 12/31/2016 12:31   Dg Chest Portable 1 View  Result Date: 12/21/2016 CLINICAL DATA:  83 year old female with chest pain. Personal history of MI 2 weeks ago. EXAM: PORTABLE CHEST 1 VIEW COMPARISON:  Portable chest 12/26/2016 and earlier. FINDINGS: Portable AP semi upright view at at 0636 hours. Stable right chest porta cath. Stable cardiomegaly and mediastinal contours. Visualized tracheal air column is within normal limits. No pneumothorax. Veiling opacity at both lung bases continues and appears mildly regressed on the left. However, there is increased bilateral suprahilar streaky pulmonary opacity. Elsewhere pulmonary vascularity appear stable. Paucity bowel gas in the upper abdomen. No acute osseous abnormality identified. IMPRESSION: 1. Increased bilateral perihilar upper lobe opacity. Consider multifocal infection and acute pulmonary edema. Aspiration is felt less likely. 2. Continued bilateral pleural effusions, small and possibly regressed  on the left. 3. Stable cardiomegaly. Electronically Signed   By: Genevie Ann M.D.   On: 12/21/2016 07:02    EKG:   Orders placed or performed during the hospital encounter of 12/19/2016  . EKG 12-Lead  . EKG 12-Lead  . ED EKG within 10 minutes  . ED EKG within 10 minutes  . EKG 12-Lead  . EKG 12-Lead    IMPRESSION AND PLAN:   #Acute hypoxic respiratory survey failure secondary to healthcare associated pneumonia Admit to telemetry Empiric IV antibiotics  cefepime and vancomycin CT chest has revealed multilobar pneumonia, moderate left and small right-sided pleural effusion Bronchodilator therapy as needed  We will get sputum culture and sensitivity if patient can provide a sample  #Elevated troponin with recent history of non-STEMI -Twelve-lead EKG with left bundle branch block it seems to be new discussed with cardiology, Dr. Clayborn Bigness not considering any heparin drip or Lovenox at this time -Troponin today is at 0.34 which actually is trending down from 4.18 from previous admission -Home medication aspirin and Lipitor -We will consider adding beta-blocker if blood pressure permits  #Malignant ovarian carcinoma Currently on chemotherapy.  Consult patient's oncology  #Hypothyroidism continue Synthyroid  All the records are reviewed and case discussed with ED provider. Management plans discussed with the patient, family and they are in agreement.  CODE STATUS: dnr  TOTAL  TIME TAKING CARE OF THIS PATIENT: 45  minutes.   Note: This dictation was prepared with Dragon dictation along with smaller phrase technology. Any transcriptional errors that result from this process are unintentional.  Nicholes Mango M.D on 12/31/2016 at 2:22 PM  Between 7am to 6pm - Pager - [phone]  After 6pm go to [email or web address] - password [email or web address] [facility]  [facility]  Office  [phone]  CC: Primary care physician; Adin Hector, MD

## 2017-01-03 NOTE — Progress Notes (Signed)
Notified MD of pt having duonebs at home. Orders placed. Per MD, may give extra dose of xanax 0.25mg  if needed. Will continue to monitor and assess.

## 2017-01-03 NOTE — ED Triage Notes (Signed)
Pt arrives via ems with chest pain. Pt from [facility]. Pt with history of MI 2 weeks ago.

## 2017-01-03 NOTE — Consult Note (Signed)
Hematology/Oncology Consult note [facility] [phone] [phone]  Patient Care Team: Adin Hector, MD as PCP - General (Internal Medicine) Clent Jacks, RN as Registered Nurse   Name of the patient: Ebony Navarro  [id number]  10/17/1933   Date of visit: 12/26/2016 REASON FOR COSULTATION:   History of presenting illness-  This is a 83 year old female who has a history of remote breast cancer, hypertension, thyroid disease, stage IIIB ovarian cancer s/p 2 cycles of chemotherapy, recent NSTEMI, GI bleeding who was readmitted shortly after discharge due to shortness of breath. Patient is drowsy and sleepy when I saw her. Her son is at bedside. Discharged on 11/23, patient felt shortness of breath worsened and in ED she was found to be hypoxic. Put on nonrebreather and  readmitted. Per son she was not able to sleep well until just now. CT showed multifocal pneumonia and moderate right and small left pleural effusion. Started on IV antibiotics.   Review of systems- Review of Systems  Constitutional: Positive for malaise/fatigue. Negative for chills and fever.  HENT: Negative for hearing loss.   Eyes: Negative for blurred vision.  Respiratory: Positive for shortness of breath.   Gastrointestinal: Negative for heartburn.  Genitourinary: Negative for dysuria.  Musculoskeletal: Negative for myalgias.  Skin: Negative for rash.  Neurological: Negative for dizziness.  Endo/Heme/Allergies: Does not bruise/bleed easily.  Psychiatric/Behavioral: Negative for depression.    Allergies  Allergen Reactions  . Ace Inhibitors Other (See Comments)    hyperkalemia  . Atenolol Other (See Comments)    Worsening palpitations  . Iodine Other (See Comments)    11/20/16: per conversation with pt, pt with allergy to topical iodine and betadine.  Pt states she has had IV contrast in the past with now issues.  . Metoprolol Tartrate Other (See Comments)     intolerant  . Omeprazole Diarrhea  . Povidone-Iodine Other (See Comments)  . Rofecoxib Nausea Only  . Ciprofloxacin Rash  . Doxycycline Calcium Rash  . Nitrofurantoin Rash  . Quinolones Rash  . Sulfa Antibiotics Rash    Patient Active Problem List   Diagnosis Date Noted  . Acute respiratory failure ([place]) 12/16/2016  . Neutropenia, drug-induced ([place])   . Thrombocytopenia ([place])   . Acute respiratory failure with hypoxemia ([place])   . Shortness of breath   . NSTEMI (non-ST elevated myocardial infarction) ([place]) 12/19/2016  . Ovarian cancer ([place]) 11/24/2016  . Malignant neoplasm of ovary ([place]) 11/24/2016  . Abdominal pain 11/20/2016  . Ascites 11/20/2016  . Ovarian mass 11/20/2016  . Absolute anemia 01/22/2015  . Anxiety 01/22/2015  . Malignant neoplasm of breast ([place]) 01/22/2015  . Clinical depression 01/22/2015  . Gastric catarrh 01/22/2015  . Blood glucose elevated 01/22/2015  . HLD (hyperlipidemia) 01/22/2015  . BP (high blood pressure) 01/22/2015  . Acquired atrophy of thyroid 04/02/2014  . Frequent UTI 11/23/2013  . Chronic kidney disease (CKD), stage III (moderate) ([place]) 10/02/2013     Past Medical History:  Diagnosis Date  . Breast cancer ([place]) 2005   Right, radiation and lumpectomy  . Breast cancer ([place]) 2002   Left, Chemo, radiation and lumpectomy  . Cancer ([place])   . Hyperlipidemia   . Hypertension   . Ovarian cancer ([place]) 11/24/2016     Past Surgical History:  Procedure Laterality Date  . BREAST BIOPSY Left 2010   negative stereotactic biopsy  . BREAST LUMPECTOMY Right 2005   positive  . BREAST LUMPECTOMY Left 2002  positive  . PORTA CATH INSERTION N/A 12/09/2016   Procedure: PORTA CATH INSERTION;  Surgeon: Algernon Huxley, MD;  Location: [facility];  Service: Cardiovascular;  Laterality: N/A;    Social History   Socioeconomic History  . Marital status: Widowed    Spouse name: Not on file  . Number of children: Not on file  . Years  of education: Not on file  . Highest education level: Not on file  Social Needs  . Financial resource strain: Not on file  . Food insecurity - worry: Not on file  . Food insecurity - inability: Not on file  . Transportation needs - medical: Not on file  . Transportation needs - non-medical: Not on file  Occupational History  . Occupation: retired  Tobacco Use  . Smoking status: Never Smoker  . Smokeless tobacco: Never Used  Substance and Sexual Activity  . Alcohol use: No  . Drug use: No  . Sexual activity: No    Birth control/protection: Post-menopausal  Other Topics Concern  . Not on file  Social History Narrative  . Not on file     Family History  Problem Relation Age of Onset  . Hypertension Mother   . Hypertension Father      Current Facility-Administered Medications:  .  0.9 %  sodium chloride infusion, , Intravenous, Continuous, Gouru, Aruna, MD, Last Rate: 75 mL/hr at 01/02/2017 2236 .  ALPRAZolam Duanne Moron) tablet 0.25 mg, 0.25 mg, Oral, QHS PRN, Gouru, Aruna, MD, 0.25 mg at 12/31/2016 2236 .  amiodarone (PACERONE) tablet 200 mg, 200 mg, Oral, BID, Gouru, Aruna, MD, 200 mg at 12/24/2016 2236 .  aspirin EC tablet 81 mg, 81 mg, Oral, Daily, Gouru, Aruna, MD, 81 mg at 12/14/2016 1302 .  atorvastatin (LIPITOR) tablet 40 mg, 40 mg, Oral, Daily, Gouru, Aruna, MD, 40 mg at 12/20/2016 1303 .  ceFEPIme (MAXIPIME) 1 g in dextrose 5 % 50 mL IVPB, 1 g, Intravenous, Q8H, Gouru, Aruna, MD, Last Rate: 100 mL/hr at 12/18/2016 2236, 1 g at 01/02/2017 2236 .  enoxaparin (LOVENOX) injection 30 mg, 30 mg, Subcutaneous, Q24H, Gouru, Aruna, MD, 30 mg at 01/04/2017 2236 .  feeding supplement (ENSURE ENLIVE) (ENSURE ENLIVE) liquid 237 mL, 237 mL, Oral, BID BM, Gouru, Aruna, MD .  furosemide (LASIX) tablet 20 mg, 20 mg, Oral, Daily, Gouru, Aruna, MD .  guaiFENesin-dextromethorphan (ROBITUSSIN DM) 100-10 MG/5ML syrup 5 mL, 5 mL, Oral, Q4H PRN, Gouru, Aruna, MD .  ipratropium-albuterol (DUONEB) 0.5-2.5 (3)  MG/3ML nebulizer solution 3 mL, 3 mL, Nebulization, Q4H PRN, Salary, Montell D, MD, 3 mL at 12/26/2016 2042 .  [START ON 01/04/2017] levothyroxine (SYNTHROID, LEVOTHROID) tablet 50 mcg, 50 mcg, Oral, QAC breakfast, Gouru, Aruna, MD .  megestrol (MEGACE) tablet 20 mg, 20 mg, Oral, Daily, Gouru, Aruna, MD, 20 mg at 12/21/2016 1445 .  Melatonin TABS 5 mg, 5 mg, Oral, QHS, Gouru, Aruna, MD .  midodrine (PROAMATINE) tablet 5 mg, 5 mg, Oral, TID WC, Gouru, Aruna, MD, 5 mg at 12/30/2016 1643 .  multivitamin with minerals tablet 1 tablet, 1 tablet, Oral, Daily, Gouru, Aruna, MD, 1 tablet at 12/12/2016 1302 .  nitroGLYCERIN (NITROSTAT) SL tablet 0.4 mg, 0.4 mg, Sublingual, Q5 Min x 3 PRN, Gouru, Aruna, MD .  ondansetron (ZOFRAN) tablet 8 mg, 8 mg, Oral, BID PRN, Gouru, Aruna, MD .  oxyCODONE-acetaminophen (PERCOCET/ROXICET) 5-325 MG per tablet 1 tablet, 1 tablet, Oral, Q4H PRN, Salary, Montell D, MD .  pantoprazole (PROTONIX) EC tablet 40 mg,  40 mg, Oral, Q1200, Gouru, Aruna, MD, 40 mg at 12/24/2016 1302 .  vancomycin (VANCOCIN) IVPB 750 mg/150 ml premix, 750 mg, Intravenous, Q24H, Coffee, Donna Christen [facility]   Physical exam:  Vitals:   01/05/2017 0945 12/13/2016 1025 12/22/2016 1924 12/28/2016 2044  BP: 116/83 102/80 110/70   Pulse: 93 93 89   Resp:  18 17   Temp:  (!) 97.4 F (36.3 C) 97.6 F (36.4 C)   TempSrc:  Oral Oral   SpO2: 93% 91% 98% 95%  Weight:  156 lb 1.6 oz (70.8 kg)    Height:  5\' 6"  (1.676 m)     GENERAL:Alert, no distress and comfortable.  EYES: no pallor or icterus OROPHARYNX: no thrush or ulceration; good dentition  NECK: supple, no masses felt LYMPH:  no palpable lymphadenopathy in the cervical, axillary or inguinal regions LUNGS: clear to auscultation, No wheeze or crackles HEART/CVS: regular rate & rhythm and no murmurs; No lower extremity edema ABDOMEN: abdomen soft, non-tender and normal bowel sounds Musculoskeletal:no cyanosis of digits and no clubbing  PSYCH: alert & oriented x 3    NEURO: drowsy SKIN:  no rashes or significant lesions     CMP Latest Ref Rng & Units 12/20/2016  Glucose 65 - 99 mg/dL 131(H)  BUN 6 - 20 mg/dL 70(H)  Creatinine 0.44 - 1.00 mg/dL 1.61(H)  Sodium 135 - 145 mmol/L 141  Potassium 3.5 - 5.1 mmol/L 4.2  Chloride 101 - 111 mmol/L 99(L)  CO2 22 - 32 mmol/L 28  Calcium 8.9 - 10.3 mg/dL 8.8(L)  Total Protein 6.5 - 8.1 g/dL 6.9  Total Bilirubin 0.3 - 1.2 mg/dL 1.9(H)  Alkaline Phos 38 - 126 U/L 91  AST 15 - 41 U/L 50(H)  ALT 14 - 54 U/L 132(H)   CBC Latest Ref Rng & Units 12/11/2016  WBC 3.6 - 11.0 K/uL 5.2  Hemoglobin 12.0 - 16.0 g/dL 10.1(L)  Hematocrit 35.0 - 47.0 % 31.5(L)  Platelets 150 - 440 K/uL 103(L)    @IMAGES @  Dg Chest 2 View  Result Date: 12/19/2016 CLINICAL DATA:  Central chest pain and shortness of breath yesterday. History of ovarian cancer, breast cancer, on chemotherapy. EXAM: CHEST  2 VIEW COMPARISON:  CT chest November 20, 2016 FINDINGS: The cardiac silhouette is mildly enlarged and unchanged. Calcified aortic knob. Small pleural effusions. No focal consolidation. RIGHT single-lumen chest Port-A-Cath distal tip projects in mid superior vena cava. No pneumothorax. Soft tissue planes and included osseous structures are nonsuspicious. Calcifications in the neck are likely vascular. Osteopenia. IMPRESSION: Mild cardiomegaly.  Small pleural effusions. Aortic Atherosclerosis (ICD10-I70.0). Electronically Signed   By: Elon Alas M.D.   On: 12/19/2016 02:00   Ct Chest Wo Contrast  Result Date: 01/02/2017 CLINICAL DATA:  83 year old female with history of chest pain and shortness of breath. Recently discharged from the hospital after treatment for aspiration pneumonia. Hemoptysis. Additional history of stage IV ovarian cancer. EXAM: CT CHEST WITHOUT CONTRAST TECHNIQUE: Multidetector CT imaging of the chest was performed following the standard protocol without IV contrast. COMPARISON:  Chest CT 12/19/2016. FINDINGS:  Cardiovascular: Heart size is mildly enlarged. There is no significant pericardial fluid, thickening or pericardial calcification. There is aortic atherosclerosis, as well as atherosclerosis of the great vessels of the mediastinum and the coronary arteries, including calcified atherosclerotic plaque in the left main, left anterior descending and right coronary arteries. Severe calcifications of the aortic valve. Mild calcifications of the mitral annulus. Right internal jugular single-lumen porta cath with tip terminating in  the mid superior vena cava. Mediastinum/Nodes: No pathologically enlarged mediastinal or hilar lymph nodes. Please note that accurate exclusion of hilar adenopathy is limited on noncontrast CT scans. Esophagus is unremarkable in appearance. No axillary lymphadenopathy. Lungs/Pleura: Compared to the prior examination there has been considerable worsening of patchy multifocal asymmetrically distributed areas of ground-glass attenuation, severe thickening of the peribronchovascular interstitium and regions of more confluent peribronchovascular airspace consolidation with some associated septal thickening and regional areas of architectural distortion. There is relative sparing of the periphery of the lungs. Dependent subsegmental atelectasis in the right lower lobe. Moderate right and small left pleural effusions lying dependently. Upper Abdomen: Aortic atherosclerosis. 3.4 x 2.4 cm low-attenuation lesion extending from the tail of the pancreas, incompletely characterized on today's noncontrast CT examination but similar to prior contrast enhanced CT of the abdomen and pelvis dated 06/21/2013, likely a pancreatic pseudocyst or other benign lesion. Amorphous intermediate attenuation material lying dependently in the gallbladder likely reflects biliary sludge. Musculoskeletal: There are no aggressive appearing lytic or blastic lesions noted in the visualized portions of the skeleton. IMPRESSION: 1.  There is a spectrum of findings in the lungs, as above, which is most compatible with either severe multilobar bronchopneumonia, or inflammatory process such as cryptogenic organizing pneumonia (COP). 2. Moderate right and small left pleural effusions lying dependently with some associated passive subsegmental atelectasis in the dependent portion of the right lower lobe. 3. Mild cardiomegaly. 4. Aortic atherosclerosis, in addition to left main and 2 vessel coronary artery disease. 5. There are calcifications of the aortic valve. Echocardiographic correlation for evaluation of potential valvular dysfunction may be warranted if clinically indicated. 6. Additional incidental findings, as above. Aortic Atherosclerosis (ICD10-I70.0). Electronically Signed   By: Vinnie Langton M.D.   On: 12/16/2016 12:31   Ct Angio Chest Pe W/cm &/or Wo Cm  Result Date: 12/19/2016 CLINICAL DATA:  Acute onset of central chest pain and shortness of breath. Current history of ovarian cancer, status post recent chemotherapy. EXAM: CT ANGIOGRAPHY CHEST WITH CONTRAST TECHNIQUE: Multidetector CT imaging of the chest was performed using the standard protocol during bolus administration of intravenous contrast. Multiplanar CT image reconstructions and MIPs were obtained to evaluate the vascular anatomy. CONTRAST:  45mL ISOVUE-370 IOPAMIDOL (ISOVUE-370) INJECTION 76% COMPARISON:  Chest radiograph performed earlier today at 1:41 a.m., and CTA of the chest performed 11/20/2016 FINDINGS: Cardiovascular:  There is no evidence of pulmonary embolus. The heart is borderline normal in size. Diffuse coronary artery calcifications are seen. Calcification is noted at the aortic valve. Scattered calcification is seen along the aortic arch and descending thoracic aorta, and at the great vessels. Borderline prominence of the ascending thoracic aorta is thought to be within normal limits given the patient's age. Mediastinum/Nodes: An enlarged subcarinal  node is suggested, measuring 2.0 cm in short axis. Previously noted esophageal wall thickening has largely resolved. A small residual hiatal hernia is seen. No pericardial effusion is identified. The thyroid gland is grossly unremarkable. No axillary lymphadenopathy is seen. A right-sided chest port is noted. Lungs/Pleura: Small bilateral pleural effusions are noted, with associated bibasilar atelectasis. No pneumothorax is seen. No definite mass is identified. Upper Abdomen: The visualized portions of the liver and the spleen are unremarkable. Small stones are noted dependently within the gallbladder. The visualized portions of the adrenal glands and kidneys are grossly unremarkable. There is a 3.4 cm cystic mass at the tail of the pancreas, as noted on prior CT. Musculoskeletal: No acute osseous abnormalities are identified. Multilevel endplate sclerotic  change is noted along the thoracic and upper lumbar spine, with vacuum phenomenon. The visualized musculature is unremarkable in appearance. Review of the MIP images confirms the above findings. IMPRESSION: 1. No evidence of pulmonary embolus. 2. Small bilateral pleural effusions, with bibasilar atelectasis. 3. Diffuse coronary artery calcifications. 4. Enlarged subcarinal node, measuring 2.0 cm in short axis, may reflect the patient's malignancy or may be chronic in nature, though mildly more prominent than on the recent prior CTA. 5. Small residual hiatal hernia noted. 6. Cholelithiasis.  Gallbladder otherwise unremarkable. 7. 3.4 cm cystic mass again noted at the tail of the pancreas; this is relatively stable in appearance. Electronically Signed   By: Garald Balding M.D.   On: 12/19/2016 04:20   Dg Chest Portable 1 View  Result Date: 12/09/2016 CLINICAL DATA:  83 year old female with chest pain. Personal history of MI 2 weeks ago. EXAM: PORTABLE CHEST 1 VIEW COMPARISON:  Portable chest 12/26/2016 and earlier. FINDINGS: Portable AP semi upright view at at  0636 hours. Stable right chest porta cath. Stable cardiomegaly and mediastinal contours. Visualized tracheal air column is within normal limits. No pneumothorax. Veiling opacity at both lung bases continues and appears mildly regressed on the left. However, there is increased bilateral suprahilar streaky pulmonary opacity. Elsewhere pulmonary vascularity appear stable. Paucity bowel gas in the upper abdomen. No acute osseous abnormality identified. IMPRESSION: 1. Increased bilateral perihilar upper lobe opacity. Consider multifocal infection and acute pulmonary edema. Aspiration is felt less likely. 2. Continued bilateral pleural effusions, small and possibly regressed on the left. 3. Stable cardiomegaly. Electronically Signed   By: Genevie Ann M.D.   On: 01/02/2017 07:02   Dg Chest Port 1 View  Result Date: 12/26/2016 CLINICAL DATA:  Shortness of Breath EXAM: PORTABLE CHEST 1 VIEW COMPARISON:  12/23/2016 FINDINGS: Cardiac shadow is stable. Persistent right upper lobe infiltrative changes are seen. Increasing central vascular congestion and interstitial changes noted. Likely small effusions are present as well. IMPRESSION: Slight increase in the degree of CHF. Patchy infiltrative changes in the right upper lobe are again noted. Electronically Signed   By: Inez Catalina M.D.   On: 12/26/2016 16:26   Dg Chest Port 1 View  Result Date: 12/23/2016 CLINICAL DATA:  Respiratory failure. EXAM: PORTABLE CHEST 1 VIEW COMPARISON:  12/22/2016 . FINDINGS: PowerPort catheter noted with tip over the superior vena cava . Cardiomegaly with bilateral from interstitial prominence and bilateral pleural effusions consistent with CHF. Bilateral pneumonitis cannot be excluded. No pneumothorax. IMPRESSION: 1. PowerPort catheter noted with tip noted over the superior vena cava . 2. Cardiomegaly with mild pulmonary interstitial prominence and bilateral pleural effusions consistent CHF. Bilateral pneumonitis cannot be excluded.  Electronically Signed   By: Marcello Moores  Register   On: 12/23/2016 09:53   Dg Chest Port 1 View  Result Date: 12/22/2016 CLINICAL DATA:  Respiratory failure EXAM: PORTABLE CHEST 1 VIEW COMPARISON:  December 20, 2016 FINDINGS: Port-A-Cath tip is in the superior vena cava. No pneumothorax. There are small pleural effusions bilaterally. There is no edema or consolidation. There is cardiomegaly with pulmonary venous hypertension. No adenopathy. There is aortic atherosclerosis. There is evidence of old trauma involving the lateral left clavicle, stable. IMPRESSION: Pulmonary vascular congestion with small pleural effusions bilaterally. No edema or consolidation evident. There is aortic atherosclerosis. No pneumothorax. Aortic Atherosclerosis (ICD10-I70.0). Electronically Signed   By: Lowella Grip III M.D.   On: 12/22/2016 07:07   Dg Chest Port 1 View  Result Date: 12/20/2016 CLINICAL DATA:  83 year old female undergoing chemotherapy for ovarian cancer. Treatment last week with subsequent shortness of breath. EXAM: PORTABLE CHEST 1 VIEW COMPARISON:  Chest CTA 12/19/2016 and earlier. FINDINGS: Portable AP upright view at 1424 hours. Stable right IJ approach chest porta cath, currently accessed. Increased radiographic opacity at both lung bases in part compatible with the pleural effusions demonstrated by CTA yesterday. Left greater than right lower lobe opacity increase. Increased patchy right perihilar opacity which partially overlaps the right Port-A-Cath. No pneumothorax. Stable cardiac size and mediastinal contours. No pneumothorax. Stable pulmonary vascularity. IMPRESSION: Increased bibasilar and right suprahilar pulmonary opacity since the CTA yesterday appears to reflect a combination of airspace disease and small pleural effusions. Consider acute multifocal infection versus aspiration. Electronically Signed   By: Genevie Ann M.D.   On: 12/20/2016 14:33    Assessment and plan- Patient is a35  y.o.femalewith remote history of breast cancer, recently diagnosed with stage III ovarian cancer being treated with curative intent status,  post 2 cycles of chemotherapy, recently NSTEMI/CHF, GI bleeding presents with worsening SOB. CT showed multifocal consolidation.   #Stage III ovarian cancer: no plan for chemotherapy until recovers. Please consult palliative care for symptom control.  no need for paracentesis at this point.  # Thrombocytopenia:  likely chemotherapy induced. Improving, Continue Aspirin.  # acute respiratory failure, possible multifocal pneumonia. Continue broad spectrum IV antibiotics.  She is afebrile. Blood culture pending.  She received Granix recently, differential includes Pneumonitis,  Recommend pulmonology consult.    # CODE STATUS:DNR/DNI.   Discussed with patient's son that although patient's cancer responds well, patient continues to have declined functional status due to NSTEMI, CHF and now respiratory failure.  She may not recover to the level to be strong enough to receive further chemotherapies. He understands and hope patient can recover soon.  I recommend to involve palliative care medicine for symptom improvement. In the future, if patient continues not doing well, she maybe hospice appropriate. Son voices understanding.   Will continue follow along her inpatient course. Total face to face encounter time for this patient visit was 50 min. >50% of the time was  spent in counseling and coordination of care.  Thank you for this kind referral and the opportunity to participate in the care of this patient  Dr. Earlie Server, MD, PhD [facility] at [facility] Pager- [phone] 12/20/2016

## 2017-01-03 NOTE — ED Notes (Signed)
Pt states that she urinated, pt changed and placed onto clean chuck pads. Pt also noted to have coughed up bloody sputum, noted to be brown in color with bright red streaks. Delane Ginger, 2A RN made aware, Dr. Margaretmary Eddy paged at this time. Pt remains on 4L via [place] and tolerating well. Pt transferred to the floor via stretcher at this time.

## 2017-01-03 NOTE — ED Notes (Signed)
Pt placed on 4L via [place] per Dr. Corky Downs to trial off of NRB mask due to patient agitation. Will continue to monitor O2 saturation while patient is on [place].

## 2017-01-03 NOTE — ED Notes (Signed)
MD aware of patient coughing up bloody sputum, states already put in for sputum culture. Delane Ginger, RN made aware.

## 2017-01-04 LAB — HEPATIC FUNCTION PANEL
ALK PHOS: 80 U/L (ref 38–126)
ALT: 94 U/L — ABNORMAL HIGH (ref 14–54)
AST: 42 U/L — ABNORMAL HIGH (ref 15–41)
Albumin: 2.7 g/dL — ABNORMAL LOW (ref 3.5–5.0)
BILIRUBIN INDIRECT: 0.8 mg/dL (ref 0.3–0.9)
Bilirubin, Direct: 0.3 mg/dL (ref 0.1–0.5)
TOTAL PROTEIN: 6.4 g/dL — AB (ref 6.5–8.1)
Total Bilirubin: 1.1 mg/dL (ref 0.3–1.2)

## 2017-01-04 LAB — PROTIME-INR
INR: 1.34
PROTHROMBIN TIME: 16.5 s — AB (ref 11.4–15.2)

## 2017-01-04 MED ORDER — ONDANSETRON HCL 4 MG PO TABS
4.0000 mg | ORAL_TABLET | Freq: Three times a day (TID) | ORAL | Status: DC | PRN
  Administered 2017-01-04: 4 mg via ORAL
  Filled 2017-01-04: qty 1

## 2017-01-04 MED ORDER — ALPRAZOLAM 0.25 MG PO TABS
0.2500 mg | ORAL_TABLET | Freq: Three times a day (TID) | ORAL | Status: DC | PRN
  Administered 2017-01-04 – 2017-01-09 (×10): 0.25 mg via ORAL
  Filled 2017-01-04 (×10): qty 1

## 2017-01-04 NOTE — Progress Notes (Signed)
[place] [facility] [place] NAME: Ebony Navarro    MR#:  [id number]  DATE OF BIRTH:  13-Feb-1933  SUBJECTIVE:  CHIEF COMPLAINT:  Pt is arousable but lethargic she got anxiolytics today a.m. she was anxious Caregiver at bedside  REVIEW OF SYSTEMS:  Review of system unobtainable as the patient is lethargic  DRUG ALLERGIES:   Allergies  Allergen Reactions  . Ace Inhibitors Other (See Comments)    hyperkalemia  . Atenolol Other (See Comments)    Worsening palpitations  . Iodine Other (See Comments)    11/20/16: per conversation with pt, pt with allergy to topical iodine and betadine.  Pt states she has had IV contrast in the past with now issues.  . Metoprolol Tartrate Other (See Comments)    intolerant  . Omeprazole Diarrhea  . Povidone-Iodine Other (See Comments)  . Rofecoxib Nausea Only  . Ciprofloxacin Rash  . Doxycycline Calcium Rash  . Nitrofurantoin Rash  . Quinolones Rash  . Sulfa Antibiotics Rash    VITALS:  Blood pressure 116/64, pulse 82, temperature (!) 97.3 F (36.3 C), temperature source Axillary, resp. rate (!) 21, height 5\' 6"  (1.676 m), weight 70.7 kg (155 lb 12.8 oz), SpO2 95 %.  PHYSICAL EXAMINATION:  GENERAL:  83 y.o.-year-old patient lying in the bed with no acute distress.  EYES: Pupils equal, round, reactive to light and accommodation. No scleral icterus. Extraocular muscles intact.  HEENT: Head atraumatic, normocephalic. Oropharynx and nasopharynx clear.  NECK:  Supple, no jugular venous distention. No thyroid enlargement, no tenderness.  LUNGS: mod breath sounds bilaterally, no wheezing, rales,rhonchi , has crepitation. No use of accessory muscles of respiration.  CARDIOVASCULAR: S1, S2 normal. No murmurs, rubs, or gallops.  ABDOMEN: Soft, nontender, nondistended. Bowel sounds present. No organomegaly or mass.  EXTREMITIES: No pedal edema, cyanosis, or clubbing.  NEUROLOGIC: Patient is delirious but  arousable PSYCHIATRIC: The patient is  Lethargic, arousable, disoriented SKIN: No obvious rash, lesion, or ulcer.    LABORATORY PANEL:   CBC Recent Labs  Lab 12/29/2016 0636  WBC 5.2  HGB 10.1*  HCT 31.5*  PLT 103*   ------------------------------------------------------------------------------------------------------------------  Chemistries  Recent Labs  Lab 12/13/2016 0636  NA 141  K 4.2  CL 99*  CO2 28  GLUCOSE 131*  BUN 70*  CREATININE 1.61*  CALCIUM 8.8*  AST 50*  ALT 132*  ALKPHOS 91  BILITOT 1.9*   ------------------------------------------------------------------------------------------------------------------  Cardiac Enzymes Recent Labs  Lab 12/19/2016 2149  TROPONINI 0.28*   ------------------------------------------------------------------------------------------------------------------  RADIOLOGY:  Ct Chest Wo Contrast  Result Date: 01/04/2017 CLINICAL DATA:  83 year old female with history of chest pain and shortness of breath. Recently discharged from the hospital after treatment for aspiration pneumonia. Hemoptysis. Additional history of stage IV ovarian cancer. EXAM: CT CHEST WITHOUT CONTRAST TECHNIQUE: Multidetector CT imaging of the chest was performed following the standard protocol without IV contrast. COMPARISON:  Chest CT 12/19/2016. FINDINGS: Cardiovascular: Heart size is mildly enlarged. There is no significant pericardial fluid, thickening or pericardial calcification. There is aortic atherosclerosis, as well as atherosclerosis of the great vessels of the mediastinum and the coronary arteries, including calcified atherosclerotic plaque in the left main, left anterior descending and right coronary arteries. Severe calcifications of the aortic valve. Mild calcifications of the mitral annulus. Right internal jugular single-lumen porta cath with tip terminating in the mid superior vena cava. Mediastinum/Nodes: No pathologically enlarged mediastinal or  hilar lymph nodes. Please note that accurate exclusion of hilar adenopathy  is limited on noncontrast CT scans. Esophagus is unremarkable in appearance. No axillary lymphadenopathy. Lungs/Pleura: Compared to the prior examination there has been considerable worsening of patchy multifocal asymmetrically distributed areas of ground-glass attenuation, severe thickening of the peribronchovascular interstitium and regions of more confluent peribronchovascular airspace consolidation with some associated septal thickening and regional areas of architectural distortion. There is relative sparing of the periphery of the lungs. Dependent subsegmental atelectasis in the right lower lobe. Moderate right and small left pleural effusions lying dependently. Upper Abdomen: Aortic atherosclerosis. 3.4 x 2.4 cm low-attenuation lesion extending from the tail of the pancreas, incompletely characterized on today's noncontrast CT examination but similar to prior contrast enhanced CT of the abdomen and pelvis dated 06/21/2013, likely a pancreatic pseudocyst or other benign lesion. Amorphous intermediate attenuation material lying dependently in the gallbladder likely reflects biliary sludge. Musculoskeletal: There are no aggressive appearing lytic or blastic lesions noted in the visualized portions of the skeleton. IMPRESSION: 1. There is a spectrum of findings in the lungs, as above, which is most compatible with either severe multilobar bronchopneumonia, or inflammatory process such as cryptogenic organizing pneumonia (COP). 2. Moderate right and small left pleural effusions lying dependently with some associated passive subsegmental atelectasis in the dependent portion of the right lower lobe. 3. Mild cardiomegaly. 4. Aortic atherosclerosis, in addition to left main and 2 vessel coronary artery disease. 5. There are calcifications of the aortic valve. Echocardiographic correlation for evaluation of potential valvular dysfunction may be  warranted if clinically indicated. 6. Additional incidental findings, as above. Aortic Atherosclerosis (ICD10-I70.0). Electronically Signed   By: Vinnie Langton M.D.   On: 12/13/2016 12:31   Dg Chest Portable 1 View  Result Date: 01/02/2017 CLINICAL DATA:  83 year old female with chest pain. Personal history of MI 2 weeks ago. EXAM: PORTABLE CHEST 1 VIEW COMPARISON:  Portable chest 12/26/2016 and earlier. FINDINGS: Portable AP semi upright view at at 0636 hours. Stable right chest porta cath. Stable cardiomegaly and mediastinal contours. Visualized tracheal air column is within normal limits. No pneumothorax. Veiling opacity at both lung bases continues and appears mildly regressed on the left. However, there is increased bilateral suprahilar streaky pulmonary opacity. Elsewhere pulmonary vascularity appear stable. Paucity bowel gas in the upper abdomen. No acute osseous abnormality identified. IMPRESSION: 1. Increased bilateral perihilar upper lobe opacity. Consider multifocal infection and acute pulmonary edema. Aspiration is felt less likely. 2. Continued bilateral pleural effusions, small and possibly regressed on the left. 3. Stable cardiomegaly. Electronically Signed   By: Genevie Ann M.D.   On: 12/28/2016 07:02    EKG:   Orders placed or performed during the hospital encounter of 12/14/2016  . EKG 12-Lead  . EKG 12-Lead  . ED EKG within 10 minutes  . ED EKG within 10 minutes  . EKG 12-Lead  . EKG 12-Lead    ASSESSMENT AND PLAN:    #Acute hypoxic respiratory survey failure secondary to healthcare associated pneumonia Continue empiric IV antibiotics cefepime and vancomycin CT chest has revealed multilobar pneumonia, moderate left and small right-sided pleural effusion Bronchodilator therapy as needed  We will get sputum culture and sensitivity if patient can provide a sample  #Elevated troponin with recent history of non-STEMI -Twelve-lead EKG with left bundle branch block it seems to  be new discussed with cardiology, Dr. Clayborn Bigness not considering any heparin drip or Lovenox at this time -Troponin today is at 0.34-0.28-0.28 which actually is trending down from 4.18 from previous admission -Home medication aspirin and Lipitor -  We will consider adding beta-blocker if blood pressure permits  #Malignant ovarian carcinoma- 3B  Hold chemotherapy.  Consult patient's oncology,D/W dr.Yu  #Hypothyroidism continue Synthyroid   PT consult is pending   All the records are reviewed and case discussed with Care Management/Social Workerr. Management plans discussed with the patient's daughter [name] over ph and caregiver at bedside and they are in agreement.  CODE STATUS: DNR   TOTAL TIME TAKING CARE OF THIS PATIENT: 36 minutes.   POSSIBLE D/C IN 1-2 DAYS, DEPENDING ON CLINICAL CONDITION.  Note: This dictation was prepared with Dragon dictation along with smaller phrase technology. Any transcriptional errors that result from this process are unintentional.   Nicholes Mango M.D on 01/04/2017 at 3:13 PM  Between 7am to 6pm - Pager - [phone] After 6pm go to [email or web address] - password [email or web address] [facility]  [place] [facility]  Office  [phone]  CC: Primary care physician; Adin Hector, MD

## 2017-01-04 NOTE — Progress Notes (Addendum)
Hematology/Oncology Progress Note [facility] [phone] [phone]  Patient Care Team: Adin Hector, MD as PCP - General (Internal Medicine) Clent Jacks, RN as Registered Nurse   Name of the patient: Ebony Navarro  [id number]  [birth year]  Date of visit: 01/04/17  INTERVAL HISTORY-  Patient was sleeping and I did not wake her up. Per care giver at bedside, she had a good night sleep.  No acute overnight events.    Review of systems- ROS Cannot obtain ROS as patient is sleeping.   Allergies  Allergen Reactions  . Ace Inhibitors Other (See Comments)    hyperkalemia  . Atenolol Other (See Comments)    Worsening palpitations  . Iodine Other (See Comments)    11/20/16: per conversation with pt, pt with allergy to topical iodine and betadine.  Pt states she has had IV contrast in the past with now issues.  . Metoprolol Tartrate Other (See Comments)    intolerant  . Omeprazole Diarrhea  . Povidone-Iodine Other (See Comments)  . Rofecoxib Nausea Only  . Ciprofloxacin Rash  . Doxycycline Calcium Rash  . Nitrofurantoin Rash  . Quinolones Rash  . Sulfa Antibiotics Rash    Patient Active Problem List   Diagnosis Date Noted  . Acute respiratory failure ([place]) 01/04/2017  . Multifocal pneumonia   . Neutropenia, drug-induced ([place])   . Thrombocytopenia ([place])   . Acute respiratory failure with hypoxemia ([place])   . Shortness of breath   . NSTEMI (non-ST elevated myocardial infarction) ([place]) 12/19/2016  . Ovarian cancer ([place]) 11/24/2016  . Malignant neoplasm of ovary ([place]) 11/24/2016  . Abdominal pain 11/20/2016  . Ascites 11/20/2016  . Ovarian mass 11/20/2016  . Absolute anemia 01/22/2015  . Anxiety 01/22/2015  . Malignant neoplasm of breast ([place]) 01/22/2015  . Clinical depression 01/22/2015  . Gastric catarrh 01/22/2015  . Blood glucose elevated 01/22/2015  . HLD (hyperlipidemia) 01/22/2015  . BP (high blood  pressure) 01/22/2015  . Acquired atrophy of thyroid 04/02/2014  . Frequent UTI 11/23/2013  . Chronic kidney disease (CKD), stage III (moderate) ([place]) 10/02/2013     Past Medical History:  Diagnosis Date  . Breast cancer ([place]) 2005   Right, radiation and lumpectomy  . Breast cancer ([place]) 2002   Left, Chemo, radiation and lumpectomy  . Cancer ([place])   . Hyperlipidemia   . Hypertension   . Ovarian cancer ([place]) 11/24/2016     Past Surgical History:  Procedure Laterality Date  . BREAST BIOPSY Left 2010   negative stereotactic biopsy  . BREAST LUMPECTOMY Right 2005   positive  . BREAST LUMPECTOMY Left 2002   positive  . PORTA CATH INSERTION N/A 12/09/2016   Procedure: PORTA CATH INSERTION;  Surgeon: Algernon Huxley, MD;  Location: [place] [facility];  Service: Cardiovascular;  Laterality: N/A;    Social History   Socioeconomic History  . Marital status: Widowed    Spouse name: Not on file  . Number of children: Not on file  . Years of education: Not on file  . Highest education level: Not on file  Social Needs  . Financial resource strain: Not on file  . Food insecurity - worry: Not on file  . Food insecurity - inability: Not on file  . Transportation needs - medical: Not on file  . Transportation needs - non-medical: Not on file  Occupational History  . Occupation: retired  Tobacco Use  . Smoking status: Never Smoker  . Smokeless  tobacco: Never Used  Substance and Sexual Activity  . Alcohol use: No  . Drug use: No  . Sexual activity: No    Birth control/protection: Post-menopausal  Other Topics Concern  . Not on file  Social History Narrative  . Not on file     Family History  Problem Relation Age of Onset  . Hypertension Mother   . Hypertension Father      Current Facility-Administered Medications:  .  ALPRAZolam Duanne Moron) tablet 0.25 mg, 0.25 mg, Oral, TID PRN, Gouru, Aruna, MD, 0.25 mg at 01/04/17 0946 .  amiodarone (PACERONE) tablet 200 mg, 200 mg,  Oral, BID, Gouru, Aruna, MD, 200 mg at 01/04/17 0949 .  aspirin EC tablet 81 mg, 81 mg, Oral, Daily, Gouru, Aruna, MD, 81 mg at 01/04/17 0950 .  atorvastatin (LIPITOR) tablet 40 mg, 40 mg, Oral, Daily, Gouru, Aruna, MD, 40 mg at 01/04/17 0949 .  ceFEPIme (MAXIPIME) 1 g in dextrose 5 % 50 mL IVPB, 1 g, Intravenous, Q8H, Gouru, Aruna, MD, Stopped at 01/04/17 0644 .  enoxaparin (LOVENOX) injection 30 mg, 30 mg, Subcutaneous, Q24H, Gouru, Aruna, MD, 30 mg at 01/01/2017 2236 .  feeding supplement (ENSURE ENLIVE) (ENSURE ENLIVE) liquid 237 mL, 237 mL, Oral, BID BM, Gouru, Aruna, MD .  furosemide (LASIX) tablet 20 mg, 20 mg, Oral, Daily, Gouru, Aruna, MD, 20 mg at 01/04/17 0949 .  guaiFENesin-dextromethorphan (ROBITUSSIN DM) 100-10 MG/5ML syrup 5 mL, 5 mL, Oral, Q4H PRN, Gouru, Aruna, MD, 5 mL at 01/04/17 0332 .  ipratropium-albuterol (DUONEB) 0.5-2.5 (3) MG/3ML nebulizer solution 3 mL, 3 mL, Nebulization, Q4H PRN, Salary, Montell D, MD, 3 mL at 12/26/2016 2042 .  levothyroxine (SYNTHROID, LEVOTHROID) tablet 50 mcg, 50 mcg, Oral, QAC breakfast, Gouru, Aruna, MD, 50 mcg at 01/04/17 0614 .  megestrol (MEGACE) tablet 20 mg, 20 mg, Oral, Daily, Gouru, Aruna, MD, 20 mg at 01/04/17 0950 .  Melatonin TABS 5 mg, 5 mg, Oral, QHS, Gouru, Aruna, MD, 5 mg at 01/02/2017 2236 .  midodrine (PROAMATINE) tablet 5 mg, 5 mg, Oral, TID WC, Gouru, Aruna, MD, 5 mg at 01/04/17 1157 .  multivitamin with minerals tablet 1 tablet, 1 tablet, Oral, Daily, Gouru, Aruna, MD, 1 tablet at 01/04/17 0949 .  nitroGLYCERIN (NITROSTAT) SL tablet 0.4 mg, 0.4 mg, Sublingual, Q5 Min x 3 PRN, Gouru, Aruna, MD .  ondansetron (ZOFRAN) tablet 4 mg, 4 mg, Oral, Q8H PRN, Gouru, Aruna, MD, 4 mg at 01/04/17 0945 .  oxyCODONE-acetaminophen (PERCOCET/ROXICET) 5-325 MG per tablet 1 tablet, 1 tablet, Oral, Q4H PRN, Salary, Montell D, MD, 1 tablet at 01/04/17 0332 .  pantoprazole (PROTONIX) EC tablet 40 mg, 40 mg, Oral, Q1200, Gouru, Aruna, MD, 40 mg at 01/04/17  1157 .  vancomycin (VANCOCIN) IVPB 750 mg/150 ml premix, 750 mg, Intravenous, Q24H, Coffee, Donna Christen [facility]   Physical exam:  Vitals:   12/13/2016 1924 12/17/2016 2044 01/04/17 0349 01/04/17 0800  BP: 110/70  115/84 116/64  Pulse: 89  88 82  Resp: 17  18 (!) 21  Temp: 97.6 F (36.4 C)  (!) 97.4 F (36.3 C) (!) 97.3 F (36.3 C)  TempSrc: Oral  Oral Axillary  SpO2: 98% 95% 93% 95%  Weight:   155 lb 12.8 oz (70.7 kg)   Height:       GENERAL:Alert, no distress and comfortable.  EYES: no pallor or icterus OROPHARYNX: no thrush or ulceration; good dentition  NECK: supple, no masses felt LYMPH:  no palpable lymphadenopathy in the cervical, axillary or inguinal  regions LUNGS: clear to auscultation and  No wheeze or crackles HEART/CVS: regular rate & rhythm and no murmurs; No lower extremity edema ABDOMEN: abdomen soft, non-tender and normal bowel sounds Musculoskeletal:no cyanosis of digits and no clubbing  PSYCH: drowsy, sleepy NEURO: no focal motor/sensory deficits SKIN:  no rashes or significant lesions     CMP Latest Ref Rng & Units 12/28/2016  Glucose 65 - 99 mg/dL 131(H)  BUN 6 - 20 mg/dL 70(H)  Creatinine 0.44 - 1.00 mg/dL 1.61(H)  Sodium 135 - 145 mmol/L 141  Potassium 3.5 - 5.1 mmol/L 4.2  Chloride 101 - 111 mmol/L 99(L)  CO2 22 - 32 mmol/L 28  Calcium 8.9 - 10.3 mg/dL 8.8(L)  Total Protein 6.5 - 8.1 g/dL 6.9  Total Bilirubin 0.3 - 1.2 mg/dL 1.9(H)  Alkaline Phos 38 - 126 U/L 91  AST 15 - 41 U/L 50(H)  ALT 14 - 54 U/L 132(H)   CBC Latest Ref Rng & Units 12/12/2016  WBC 3.6 - 11.0 K/uL 5.2  Hemoglobin 12.0 - 16.0 g/dL 10.1(L)  Hematocrit 35.0 - 47.0 % 31.5(L)  Platelets 150 - 440 K/uL 103(L)      Assessment and plan- Patient is a83 y.o.femalewith remote history of breast cancer, recently diagnosed with stage III ovarian cancer being treated with curative intent status, post 2 cycles of chemotherapy, recently NSTEMI/CHF, GI bleeding presents with worsening SOB.  CT showed multifocal consolidation.   #Stage III ovarian cancer: no plan for chemotherapy until recovers. recommend consult palliative care for symptom control.   # Thrombocytopenia: likely chemotherapy induced. Improving, Continue Aspirin. Continue DVT prophylaxis with lovenox.  # acute respiratory failure, possible multifocal pneumonia. Continue broad spectrum IV antibiotics. She is afebrile. Blood culture pending.  She received Granix recently, differential includes Pneumonitis,  if respiratory status does not improve with IV antibiotics,please consult pulmonology and start patient on steroids.   # elevated PT and elevated LFTs. Etiology unclear. Repeat coags today as well as LFT.   # CODE STATUS:DNR/DNI.per family request, I called daughter [name] and had a brief discussion with her. She understands that her mother is quick sick and may not recover well to get additional chemotherapy. Chemotherapy decision will be made outpatient if she recovers from current admission.   Plan discussed with primary team attending Dr.Gouru.     Earlie Server, MD, PhD [facility] at [facility] Pager- [phone] 01/04/2017

## 2017-01-04 NOTE — Clinical Social Work Note (Signed)
CSW received call from Seth Bake at [facility] stating that patient's daughter has been calling their facility repeatedly to try and get patient a bed there. Seth Bake stated that if patient is to continue chemotherapy, they would not be able to extend an offer. CSW has informed Seth Bake that all chemo is on hold and not scheduled to resume at this time until patient has recovered from this current illness. CSW awaiting response from Seth Bake at [facility]. Shela Leff MSW,LCSW [phone]

## 2017-01-04 NOTE — NC FL2 (Addendum)
[place] LEVEL OF CARE SCREENING TOOL     IDENTIFICATION  Patient Name: Ebony Navarro Birthdate: 1933-09-28 Sex: female Admission Date (Current Location): 01/02/2017  [place] and [place] Number:  Engineering geologist and Address:  [facility], [address] [place], [place] [address]      Provider Number: [id number]  Attending Physician Name and Address:  Nicholes Mango, MD  Relative Name and Phone Number:       Current Level of Care: Hospital Recommended Level of Care: [place] Prior Approval Number:    Date Approved/Denied:   PASRR Number:    Discharge Plan: SNF    Current Diagnoses: Patient Active Problem List   Diagnosis Date Noted  . Acute respiratory failure ([place]) 01/06/2017  . Multifocal pneumonia   . Neutropenia, drug-induced ([place])   . Thrombocytopenia ([place])   . Acute respiratory failure with hypoxemia ([place])   . Shortness of breath   . NSTEMI (non-ST elevated myocardial infarction) ([place]) 12/19/2016  . Ovarian cancer ([place]) 11/24/2016  . Malignant neoplasm of ovary ([place]) 11/24/2016  . Abdominal pain 11/20/2016  . Ascites 11/20/2016  . Ovarian mass 11/20/2016  . Absolute anemia 01/22/2015  . Anxiety 01/22/2015  . Malignant neoplasm of breast ([place]) 01/22/2015  . Clinical depression 01/22/2015  . Gastric catarrh 01/22/2015  . Blood glucose elevated 01/22/2015  . HLD (hyperlipidemia) 01/22/2015  . BP (high blood pressure) 01/22/2015  . Acquired atrophy of thyroid 04/02/2014  . Frequent UTI 11/23/2013  . Chronic kidney disease (CKD), stage III (moderate) ([facility]) 10/02/2013    Orientation RESPIRATION BLADDER Height & Weight     Self, Place  O2, Normal(3 liters) Incontinent Weight: 155 lb 12.8 oz (70.7 kg) Height:  5\' 6"  (167.6 cm)  BEHAVIORAL SYMPTOMS/MOOD NEUROLOGICAL BOWEL NUTRITION STATUS  (none) (none) Incontinent Diet(heart healthy)  AMBULATORY STATUS COMMUNICATION OF NEEDS Skin    Extensive Assist Verbally Normal                       Personal Care Assistance Level of Assistance    Bathing Assistance: Maximum assistance Feeding assistance: Limited assistance Dressing Assistance: Maximum assistance Total Care Assistance: Maximum assistance   Functional Limitations Info  (no issues)          SPECIAL CARE FACTORS FREQUENCY  PT (By licensed PT)                    Contractures Contractures Info: Not present    Additional Factors Info  Code Status, Allergies Code Status Info: dnr Allergies Info: ace inhibitors, atenolol; iodine; metoprolol; cipro, sulf abx; calcium; nitrofurantoin; quinolones; rofexocibe           Current Medications (01/04/2017):  This is the current hospital active medication list Current Facility-Administered Medications  Medication Dose Route Frequency Provider Last Rate Last Dose  . ALPRAZolam Duanne Moron) tablet 0.25 mg  0.25 mg Oral TID PRN Nicholes Mango, MD   0.25 mg at 01/04/17 0946  . amiodarone (PACERONE) tablet 200 mg  200 mg Oral BID Memorie Yokoyama, MD   200 mg at 01/04/17 0949  . aspirin EC tablet 81 mg  81 mg Oral Daily Lakeasha Petion, MD   81 mg at 01/04/17 0950  . atorvastatin (LIPITOR) tablet 40 mg  40 mg Oral Daily Kahleb Mcclane, MD   40 mg at 01/04/17 0949  . ceFEPIme (MAXIPIME) 1 g in dextrose 5 % 50 mL IVPB  1 g Intravenous Q8H Chele Cornell,  MD 100 mL/hr at 01/04/17 1504 1 g at 01/04/17 1504  . enoxaparin (LOVENOX) injection 30 mg  30 mg Subcutaneous Q24H Kally Cadden, MD   30 mg at 12/28/2016 2236  . feeding supplement (ENSURE ENLIVE) (ENSURE ENLIVE) liquid 237 mL  237 mL Oral BID BM Tyee Vandevoorde, MD   237 mL at 01/04/17 1504  . furosemide (LASIX) tablet 20 mg  20 mg Oral Daily Azora Bonzo, MD   20 mg at 01/04/17 0949  . guaiFENesin-dextromethorphan (ROBITUSSIN DM) 100-10 MG/5ML syrup 5 mL  5 mL Oral Q4H PRN Kaimen Peine, MD   5 mL at 01/04/17 0332  . ipratropium-albuterol (DUONEB) 0.5-2.5 (3) MG/3ML nebulizer  solution 3 mL  3 mL Nebulization Q4H PRN Salary, Montell D, MD   3 mL at 01/01/2017 2042  . levothyroxine (SYNTHROID, LEVOTHROID) tablet 50 mcg  50 mcg Oral QAC breakfast Nicholes Mango, MD   50 mcg at 01/04/17 0614  . megestrol (MEGACE) tablet 20 mg  20 mg Oral Daily Irvin Lizama, MD   20 mg at 01/04/17 0950  . Melatonin TABS 5 mg  5 mg Oral QHS Luceal Hollibaugh, MD   5 mg at 12/24/2016 2236  . midodrine (PROAMATINE) tablet 5 mg  5 mg Oral TID WC Raymone Pembroke, MD   5 mg at 01/04/17 1157  . multivitamin with minerals tablet 1 tablet  1 tablet Oral Daily Margene Cherian, MD   1 tablet at 01/04/17 0949  . nitroGLYCERIN (NITROSTAT) SL tablet 0.4 mg  0.4 mg Sublingual Q5 Min x 3 PRN Lavada Langsam, MD      . ondansetron (ZOFRAN) tablet 4 mg  4 mg Oral Q8H PRN Aliegha Paullin, MD   4 mg at 01/04/17 0945  . oxyCODONE-acetaminophen (PERCOCET/ROXICET) 5-325 MG per tablet 1 tablet  1 tablet Oral Q4H PRN Gorden Harms, MD   1 tablet at 01/04/17 0332  . pantoprazole (PROTONIX) EC tablet 40 mg  40 mg Oral Q1200 Louise Victory, MD   40 mg at 01/04/17 1157  . vancomycin (VANCOCIN) IVPB 750 mg/150 ml premix  750 mg Intravenous Q24H Coffee, Donna Christen, [facility]         Discharge Medications: Please see discharge summary for a list of discharge medications.  Relevant Imaging Results:  Relevant Lab Results:   Additional Information ss: [id number]  Shela Leff, LCSW

## 2017-01-04 NOTE — Plan of Care (Signed)
  Not Progressing Nutrition: Adequate nutrition will be maintained 01/04/2017 2356 - Not Progressing by Jeri Cos, RN

## 2017-01-04 NOTE — Progress Notes (Signed)
PT Cancellation Note  Patient Details Name: Ebony Navarro MRN: [id number] DOB: 09/28/1933   Cancelled Treatment:    Reason Eval/Treat Not Completed: Fatigue/lethargy limiting ability to participate. Consult received and chart reviewed. Pt currently very lethargic, unable to arouse with verbal or tactile cues. Caregiver present in room, reports pt fell asleep after RN gave her a bath. Unable to participate with therapy, will re-attempt.   Sandrika Schwinn 01/04/2017, 1:53 PM  Greggory Stallion, PT, DPT [phone]

## 2017-01-04 NOTE — Plan of Care (Signed)
  Not Progressing Clinical Measurements: Respiratory complications will improve 01/04/2017 4758 - Not Progressing by Jeri Cos, RN

## 2017-01-05 ENCOUNTER — Ambulatory Visit: Payer: Medicare Other | Admitting: Family

## 2017-01-05 LAB — BASIC METABOLIC PANEL
Anion gap: 12 (ref 5–15)
BUN: 69 mg/dL — AB (ref 6–20)
CO2: 26 mmol/L (ref 22–32)
CREATININE: 1.6 mg/dL — AB (ref 0.44–1.00)
Calcium: 8.3 mg/dL — ABNORMAL LOW (ref 8.9–10.3)
Chloride: 104 mmol/L (ref 101–111)
GFR calc Af Amer: 34 mL/min — ABNORMAL LOW (ref >60)
GFR, EST NON AFRICAN AMERICAN: 29 mL/min — AB (ref >60)
GLUCOSE: 125 mg/dL — AB (ref 65–99)
Potassium: 3.9 mmol/L (ref 3.5–5.1)
SODIUM: 142 mmol/L (ref 135–145)

## 2017-01-05 LAB — CBC WITH DIFFERENTIAL/PLATELET
Basophils Absolute: 0.1 10*3/uL (ref 0–0.1)
Basophils Relative: 1 %
EOS ABS: 0.1 10*3/uL (ref 0–0.7)
EOS PCT: 2 %
HCT: 30.6 % — ABNORMAL LOW (ref 35.0–47.0)
Hemoglobin: 9.5 g/dL — ABNORMAL LOW (ref 12.0–16.0)
LYMPHS ABS: 0.9 10*3/uL — AB (ref 1.0–3.6)
Lymphocytes Relative: 15 %
MCH: 30 pg (ref 26.0–34.0)
MCHC: 30.9 g/dL — AB (ref 32.0–36.0)
MCV: 97.1 fL (ref 80.0–100.0)
MONO ABS: 0.4 10*3/uL (ref 0.2–0.9)
MONOS PCT: 7 %
Neutro Abs: 4.6 10*3/uL (ref 1.4–6.5)
Neutrophils Relative %: 75 %
PLATELETS: 96 10*3/uL — AB (ref 150–440)
RBC: 3.15 MIL/uL — ABNORMAL LOW (ref 3.80–5.20)
RDW: 19 % — AB (ref 11.5–14.5)
WBC: 6.1 10*3/uL (ref 3.6–11.0)

## 2017-01-05 LAB — INFLUENZA PANEL BY PCR (TYPE A & B)
Influenza A By PCR: NEGATIVE
Influenza B By PCR: NEGATIVE

## 2017-01-05 LAB — HIV ANTIBODY (ROUTINE TESTING W REFLEX): HIV Screen 4th Generation wRfx: NONREACTIVE

## 2017-01-05 LAB — PROCALCITONIN

## 2017-01-05 MED ORDER — BUDESONIDE 0.5 MG/2ML IN SUSP
0.5000 mg | Freq: Two times a day (BID) | RESPIRATORY_TRACT | Status: DC
  Administered 2017-01-05 – 2017-01-11 (×13): 0.5 mg via RESPIRATORY_TRACT
  Filled 2017-01-05 (×13): qty 2

## 2017-01-05 MED ORDER — SODIUM CHLORIDE 0.9% FLUSH
10.0000 mL | Freq: Two times a day (BID) | INTRAVENOUS | Status: DC
  Administered 2017-01-05 – 2017-01-10 (×9): 10 mL

## 2017-01-05 MED ORDER — SODIUM CHLORIDE 0.9% FLUSH
10.0000 mL | INTRAVENOUS | Status: DC | PRN
  Administered 2017-01-11: 10 mL
  Filled 2017-01-05: qty 40

## 2017-01-05 MED ORDER — IPRATROPIUM-ALBUTEROL 0.5-2.5 (3) MG/3ML IN SOLN
3.0000 mL | Freq: Four times a day (QID) | RESPIRATORY_TRACT | Status: DC
  Administered 2017-01-05 – 2017-01-11 (×25): 3 mL via RESPIRATORY_TRACT
  Filled 2017-01-05 (×24): qty 3

## 2017-01-05 MED ORDER — QUETIAPINE FUMARATE 25 MG PO TABS
12.5000 mg | ORAL_TABLET | Freq: Every day | ORAL | Status: DC
  Administered 2017-01-05: 12.5 mg via ORAL
  Filled 2017-01-05: qty 1

## 2017-01-05 MED ORDER — METHYLPREDNISOLONE SODIUM SUCC 40 MG IJ SOLR
40.0000 mg | Freq: Every day | INTRAMUSCULAR | Status: DC
  Administered 2017-01-05 – 2017-01-07 (×3): 40 mg via INTRAVENOUS
  Filled 2017-01-05 (×3): qty 1

## 2017-01-05 NOTE — Evaluation (Signed)
Physical Therapy Evaluation Patient Details Name: Ebony Navarro MRN: [id number] DOB: July 28, 1933 Today's Date: 01/05/2017   History of Present Illness  Pt admitted for complaints of SOB with positive pneumonia. Recent hosptial admission with similar symptoms and was dc'd to SNF. History includes breast cancer, ovarian cancer, NSTEMI, and HTN. Troponins are downtrending at this time  Clinical Impression  Pt is a pleasant 83 year old female who was admitted for pneumonia and SOB symptoms. Pt performs bed mobility with min assist and refuses further transfers/ambulation this date. Pt demonstrates deficits with strength/mobility. Much of examination appeared limited secondary to lethargy. Pt very sleepy, however easily arousable. Would benefit from skilled PT to address above deficits and promote optimal return to PLOF; recommend transition to STR upon discharge from acute hospitalization.       Follow Up Recommendations SNF    Equipment Recommendations       Recommendations for Other Services       Precautions / Restrictions Precautions Precautions: Fall Restrictions Weight Bearing Restrictions: No      Mobility  Bed Mobility Overal bed mobility: Needs Assistance Bed Mobility: Rolling Rolling: Min assist         General bed mobility comments: refuses OOB mobility, however agreeable to perform rolling for pressure relief. Needs slight assist and cues for sequencing. Pt then needed min assist for scooting up towards [place].  Transfers                 General transfer comment: refuses at this time  Ambulation/Gait                Stairs            Wheelchair Mobility    Modified Rankin (Stroke Patients Only)       Balance                                             Pertinent Vitals/Pain Pain Assessment: No/denies pain    Home Living Family/patient expects to be discharged to:: Skilled nursing facility                      Prior Function Level of Independence: Needs assistance         Comments: has required assist at SNF using RW. Prior to SNF stay, was independent without AD.     Hand Dominance        Extremity/Trunk Assessment   Upper Extremity Assessment Upper Extremity Assessment: Generalized weakness(B UE grossly 3+/5)    Lower Extremity Assessment Lower Extremity Assessment: Generalized weakness(B LE grossly 3+/5)       Communication   Communication: No difficulties  Cognition Arousal/Alertness: Lethargic Behavior During Therapy: WFL for tasks assessed/performed Overall Cognitive Status: Impaired/Different from baseline                                        General Comments General comments (skin integrity, edema, etc.): Pt maintained on 3L of O2 for entire session.    Exercises Other Exercises Other Exercises: agreeable to supine ther-ex. Education given for continued performance during hospital stay to prevent atrophy. Execises included B LE ankle pumps, SLRs, and heel slides. All ther-ex performed x 10 reps with cga.   Assessment/Plan    PT Assessment Patient  needs continued PT services  PT Problem List Decreased strength;Decreased activity tolerance;Decreased balance;Decreased mobility       PT Treatment Interventions DME instruction;Gait training;Stair training;Therapeutic activities;Functional mobility training;Therapeutic exercise;Balance training;Neuromuscular re-education;Patient/family education    PT Goals (Current goals can be found in the Care Plan section)  Acute Rehab PT Goals Patient Stated Goal: to return back to PLOF PT Goal Formulation: With patient Time For Goal Achievement: 01/19/17 Potential to Achieve Goals: Good    Frequency Min 2X/week   Barriers to discharge        Co-evaluation               AM-PAC PT "6 Clicks" Daily Activity  Outcome Measure Difficulty turning over in bed (including adjusting  bedclothes, sheets and blankets)?: Unable Difficulty moving from lying on back to sitting on the side of the bed? : Unable Difficulty sitting down on and standing up from a chair with arms (e.g., wheelchair, bedside commode, etc,.)?: Unable Help needed moving to and from a bed to chair (including a wheelchair)?: A Lot Help needed walking in hospital room?: A Lot Help needed climbing 3-5 steps with a railing? : A Lot 6 Click Score: 9    End of Session Equipment Utilized During Treatment: Oxygen Activity Tolerance: Patient limited by lethargy Patient left: in bed;with bed alarm set;with family/visitor present Nurse Communication: Mobility status PT Visit Diagnosis: Unsteadiness on feet (R26.81);Muscle weakness (generalized) (M62.81);Difficulty in walking, not elsewhere classified (R26.2)    Time: 7001-7494 PT Time Calculation (min) (ACUTE ONLY): 14 min   Charges:   PT Evaluation $PT Eval Low Complexity: 1 Low PT Treatments $Therapeutic Exercise: 8-22 mins   PT G Codes:   PT G-Codes **NOT FOR INPATIENT CLASS** Functional Assessment Tool Used: AM-PAC 6 Clicks Basic Mobility Functional Limitation: Mobility: Walking and moving around Mobility: Walking and Moving Around Current Status (W9675): At least 60 percent but less than 80 percent impaired, limited or restricted Mobility: Walking and Moving Around Goal Status [phone]): At least 20 percent but less than 40 percent impaired, limited or restricted    Greggory Stallion, PT, DPT [phone]   Katyana Trolinger 01/05/2017, 4:39 PM

## 2017-01-05 NOTE — Progress Notes (Signed)
Patient ID: Ebony Navarro, female   DOB: [birth year], 83 y.o.   MRN: [id number]  [facility] PROGRESS NOTE  ALLYSIA INGLES OFB:[id number] DOB: 21-Jun-1933 DOA: 12/10/2016 PCP: Adin Hector, MD  HPI/Subjective: Patient confused and did not realize she went to a rehab and came back to the hospital.  Patient feels okay.  She thinks her breathing is a little bit better.  As per the daughter, she had been hallucinating and not been sleeping.  Objective: Vitals:   01/05/17 1432 01/05/17 1551  BP:    Pulse: 83   Resp: 20   Temp:    SpO2: 96% 92%    Filed Weights   12/29/2016 1025 01/04/17 0349 01/05/17 0500  Weight: 70.8 kg (156 lb 1.6 oz) 70.7 kg (155 lb 12.8 oz) 72.1 kg (158 lb 14.4 oz)    ROS: Review of Systems  Constitutional: Negative for chills and fever.  Eyes: Negative for blurred vision.  Respiratory: Positive for shortness of breath. Negative for cough.   Cardiovascular: Negative for chest pain.  Gastrointestinal: Negative for abdominal pain, constipation, diarrhea, nausea and vomiting.  Genitourinary: Negative for dysuria.  Musculoskeletal: Negative for joint pain.  Neurological: Negative for dizziness and headaches.   Exam: Physical Exam  HENT:  Nose: No mucosal edema.  Mouth/Throat: No oropharyngeal exudate or posterior oropharyngeal edema.  Eyes: Conjunctivae, EOM and lids are normal. Pupils are equal, round, and reactive to light.  Neck: No JVD present. Carotid bruit is not present. No edema present. No thyroid mass and no thyromegaly present.  Cardiovascular: S1 normal and S2 normal. Exam reveals no gallop.  No murmur heard. Pulses:      Dorsalis pedis pulses are 2+ on the right side, and 2+ on the left side.  Respiratory: No respiratory distress. She has decreased breath sounds in the right middle field, the right lower field, the left middle field and the left lower field. She has wheezes in the right middle field and the left middle field. She  has rhonchi in the right lower field and the left lower field. She has no rales.  GI: Soft. Bowel sounds are normal. There is no tenderness.  Musculoskeletal:       Right ankle: She exhibits swelling.       Left ankle: She exhibits swelling.  Lymphadenopathy:    She has no cervical adenopathy.  Neurological: She is alert. No cranial nerve deficit.  Skin: Skin is warm. No rash noted. Nails show no clubbing.  Psychiatric:  Confused since the time I saw her last.      Data Reviewed: Basic Metabolic Panel: Recent Labs  Lab 12/10/2016 0636 01/05/17 0556  NA 141 142  K 4.2 3.9  CL 99* 104  CO2 28 26  GLUCOSE 131* 125*  BUN 70* 69*  CREATININE 1.61* 1.60*  CALCIUM 8.8* 8.3*   Liver Function Tests: Recent Labs  Lab 12/29/2016 0636 01/04/17 1516  AST 50* 42*  ALT 132* 94*  ALKPHOS 91 80  BILITOT 1.9* 1.1  PROT 6.9 6.4*  ALBUMIN 3.1* 2.7*   CBC: Recent Labs  Lab 12/22/2016 0636 01/05/17 0556  WBC 5.2 6.1  NEUTROABS  --  4.6  HGB 10.1* 9.5*  HCT 31.5* 30.6*  MCV 94.6 97.1  PLT 103* 96*   Cardiac Enzymes: Recent Labs  Lab 12/25/2016 0636 12/28/2016 1023 12/26/2016 1507 01/02/2017 2149  TROPONINI 0.34* 0.35* 0.28* 0.28*    CBG: Recent Labs  Lab 12/31/16 1135 12/31/16 1656 12/31/16 2115 01/01/17  0825 01/01/17 1147  GLUCAP 164* 103* 148* 132* 108*    Recent Results (from the past 240 hour(s))  Blood culture (routine x 2)     Status: None (Preliminary result)   Collection Time: 12/24/2016  7:45 AM  Result Value Ref Range Status   Specimen Description BLOOD RIGHT ANTECUBITAL  Final   Special Requests   Final    BOTTLES DRAWN AEROBIC AND ANAEROBIC Blood Culture results may not be optimal due to an excessive volume of blood received in culture bottles   Culture NO GROWTH 2 DAYS  Final   Report Status PENDING  Incomplete  Blood culture (routine x 2)     Status: None (Preliminary result)   Collection Time: 12/19/2016  7:45 AM  Result Value Ref Range Status   Specimen  Description BLOOD BLOOD LEFT ARM  Final   Special Requests   Final    BOTTLES DRAWN AEROBIC AND ANAEROBIC Blood Culture adequate volume   Culture NO GROWTH 2 DAYS  Final   Report Status PENDING  Incomplete     Scheduled Meds: . amiodarone  200 mg Oral BID  . aspirin EC  81 mg Oral Daily  . atorvastatin  40 mg Oral Daily  . budesonide (PULMICORT) nebulizer solution  0.5 mg Nebulization BID  . feeding supplement (ENSURE ENLIVE)  237 mL Oral BID BM  . furosemide  20 mg Oral Daily  . ipratropium-albuterol  3 mL Nebulization Q6H  . levothyroxine  50 mcg Oral QAC breakfast  . megestrol  20 mg Oral Daily  . Melatonin  5 mg Oral QHS  . methylPREDNISolone (SOLU-MEDROL) injection  40 mg Intravenous Daily  . midodrine  5 mg Oral TID WC  . multivitamin with minerals  1 tablet Oral Daily  . pantoprazole  40 mg Oral Q1200  . QUEtiapine  12.5 mg Oral QHS   Continuous Infusions: . ceFEPime (MAXIPIME) IV Stopped (01/05/17 1345)  . vancomycin Stopped (01/04/17 1850)    Assessment/Plan:  1. Acute hypoxic respiratory failure.  Continue oxygen supplementation and try to taper to off. 2. Healthcare associated pneumonia.  On vancomycin and cefepime.  First pro-calcitonin is negative. 3. Bronchospasm and wheezing.  Start Solu-Medrol and nebulizer treatments DuoNeb and budesonide 4. Insomnia and hallucinations start low-dose Seroquel 5. Stage III ovarian cancer 6. Recent non-STEMI troponins trending down.  On low-dose aspirin. 7. Recent GI bleed stop Lovenox injections.  On Protonix 8. Atrial fibrillation on amiodarone 9. Hypothyroidism unspecified on levothyroxine 10. Hypotension on midodrine 11. Palliative care consultation with 3 hospitalizations in a short period of time at a steep decline.  Code Status:     Code Status Orders  (From admission, onward)        Start     Ordered   12/09/2016 1034  Do not attempt resuscitation (DNR)  Continuous    Question Answer Comment  In the event of  cardiac or respiratory ARREST Do not call a "code blue"   In the event of cardiac or respiratory ARREST Do not perform Intubation, CPR, defibrillation or ACLS   In the event of cardiac or respiratory ARREST Use medication by any route, position, wound care, and other measures to relive pain and suffering. May use oxygen, suction and manual treatment of airway obstruction as needed for comfort.   Comments rn may pronounce      01/02/2017 1035    Code Status History    Date Active Date Inactive Code Status Order ID Comments User Context  12/21/2016 18:54 01/01/2017 20:14 DNR [id number]  Shireen Quan, RN Inpatient   12/19/2016 05:26 12/21/2016 18:54 Full Code [id number]  Saundra Shelling, MD Inpatient   11/20/2016 16:39 11/22/2016 19:50 Full Code [id number]  Idelle Crouch, MD ED    Advance Directive Documentation     Most Recent Value  Type of Advance Directive  Healthcare Power of Attorney  Pre-existing out of facility DNR order (yellow form or pink MOST form)  No data  "MOST" Form in Place?  No data     Family Communication: Spoke with daughter on the phone Disposition Plan: To be determined  Consultants:  Oncology  Palliative care  Antibiotics: Cefepime and vancomycin  Time spent: 28 minutes  [place]

## 2017-01-05 NOTE — Progress Notes (Signed)
Patient ID: [name], female   DOB: 1933/12/29, 83 y.o.   MRN: [id number]  This NP visited patient at the bedside (sister Lelan Pons present) and at pateitn's request contacted daughter/Kellie in attempt to schedule a [place] meeting.  Discussed role of palliative care in a holistic treatment plan.  Explained that family  Meeting/conversation would encompass diagnosis, prognosis, goals of care, treatment option decisions, anticipatory care needs.   [name] declined a palliative consult at this time stating that she wanted to give current medical interventions time and would consider meeting with this nurse practitioner on Monday afternoon if it made sense at that time.  Discussed with daughter the importance of continued conversation with patient  and their  medical providers regarding overall plan of care and treatment options,  ensuring decisions are within the context of the patients values and GOCs.  Questions and concerns addressed  No charge   Wadie Lessen NP  Palliative Medicine Team Team Phone # [phone] Pager [phone]

## 2017-01-05 NOTE — Plan of Care (Signed)
  Clinical Measurements: Ability to maintain clinical measurements within normal limits will improve 01/05/2017 1448 - Not Progressing by Daron Offer, RN Note GFR = only 34 today. Will continue to monitor renal status. Wenda Low [facility]

## 2017-01-05 NOTE — Clinical Social Work Note (Signed)
Clinical Social Work Assessment  Patient Details  Name: Ebony Navarro MRN: [id number] Date of Birth: Mar 04, 1933  Date of referral:  01/05/17               Reason for consult:  Facility Placement                Permission sought to share information with:  Facility Sport and exercise psychologist, Family Supports Permission granted to share information::  Yes, Verbal Permission Granted  Name::     Ebony Navarro, Ebony Navarro [phone] or Ebony Navarro   [phone]   Agency::  SNF admissions  Relationship::     Contact Information:     Housing/Transportation Living arrangements for the past 2 months:  Single Family Home Source of Information:  Adult Children, Medical Team Patient Interpreter Needed:  None Criminal Activity/Legal Involvement Pertinent to Current Situation/Hospitalization:  No - Comment as needed Significant Relationships:  Adult Children Lives with:  Self Do you feel safe going back to the place where you live?  No Need for family participation in patient care:  No (Coment)  Care giving concerns:  Patient's family feels she needs to to go SNF for rehab.   Social Worker assessment / plan:  Patient is an 83 year old female who is alert and oriented x4.  Patient was sleeping, CSW completed assessment by reviewing patient's medical record.  Patient was just recently in hospital, and she was discharged to [facility], however she was only at [facility] for a couple of days and then was readmitted back to hospital.  Patient's family is involved in patient's care, and is at bedside most of the time.  Patient's family has hired a Actuary which they are paying for privately to give someone for her to talk to and keep an eye on her.  Patient does not have an order for a sitter.  Patient is not currently receiving chemo, patient normally lives alone plan is for her to get rehab so she can return back home.  CSW was given permission to begin bed search by her family.   Patient's family prefers [facility].  Employment status:  Retired Nurse, adult PT Recommendations:  [place] / Referral to community resources:  [place]  Patient/Family's Response to care:  Patient and family are agreeable to going to SNF for short term rehab, and then they may consider hospice.  Patient/Family's Understanding of and Emotional Response to Diagnosis, Current Treatment, and Prognosis:  Patient's family are hopeful she will not have to be at [facility] for very long.  Emotional Assessment Appearance:  Appears stated age Attitude/Demeanor/Rapport:    Affect (typically observed):  Appropriate, Calm Orientation:  Oriented to Self, Oriented to Place, Oriented to  Time, Oriented to Situation Alcohol / Substance use:  Not Applicable Psych involvement (Current and /or in the community):  No (Comment)  Discharge Needs  Concerns to be addressed:  Lack of Support Readmission within the last 30 days:  Yes(01/01/17 to [facility]) Current discharge risk:  Lack of support system, Lives alone Barriers to Discharge:  Continued Medical Work up   Ebony Navarro 01/05/2017, 4:45 PM

## 2017-01-05 NOTE — Clinical Social Work Note (Signed)
CSW has been contacted by Seth Bake at [facility] and they can take patient at discharge. Shela Leff MSW,LCSW [phone]

## 2017-01-06 LAB — RESPIRATORY PANEL BY PCR
ADENOVIRUS-RVPPCR: NOT DETECTED
BORDETELLA PERTUSSIS-RVPCR: NOT DETECTED
CORONAVIRUS HKU1-RVPPCR: NOT DETECTED
CORONAVIRUS NL63-RVPPCR: NOT DETECTED
Chlamydophila pneumoniae: NOT DETECTED
Coronavirus 229E: NOT DETECTED
Coronavirus OC43: NOT DETECTED
Influenza A: NOT DETECTED
Influenza B: NOT DETECTED
METAPNEUMOVIRUS-RVPPCR: NOT DETECTED
Mycoplasma pneumoniae: NOT DETECTED
PARAINFLUENZA VIRUS 2-RVPPCR: NOT DETECTED
PARAINFLUENZA VIRUS 3-RVPPCR: NOT DETECTED
Parainfluenza Virus 1: NOT DETECTED
Parainfluenza Virus 4: NOT DETECTED
RHINOVIRUS / ENTEROVIRUS - RVPPCR: NOT DETECTED
Respiratory Syncytial Virus: NOT DETECTED

## 2017-01-06 LAB — PROCALCITONIN: PROCALCITONIN: 0.12 ng/mL

## 2017-01-06 MED ORDER — VANCOMYCIN HCL IN DEXTROSE 750-5 MG/150ML-% IV SOLN
750.0000 mg | INTRAVENOUS | Status: DC
  Administered 2017-01-06: 750 mg via INTRAVENOUS
  Filled 2017-01-06 (×2): qty 150

## 2017-01-06 MED ORDER — ACETAMINOPHEN 325 MG PO TABS
650.0000 mg | ORAL_TABLET | Freq: Four times a day (QID) | ORAL | Status: DC | PRN
  Filled 2017-01-06: qty 2

## 2017-01-06 MED ORDER — QUETIAPINE FUMARATE 25 MG PO TABS
25.0000 mg | ORAL_TABLET | Freq: Every day | ORAL | Status: DC
  Administered 2017-01-06 – 2017-01-09 (×4): 25 mg via ORAL
  Filled 2017-01-06 (×4): qty 1

## 2017-01-06 NOTE — Progress Notes (Signed)
Patient ID: Ebony Navarro, female   DOB: 02/05/1934, 83 y.o.   MRN: [id number]  [facility] PROGRESS NOTE  Ebony Navarro DVV:[id number] DOB: Apr 27, 1933 DOA: 12/24/2016 PCP: Adin Hector, MD  HPI/Subjective: Patient seen earlier today and was sitting in a chair.  She was lethargic but did answer some questions.  She asked to go back into the bed.  She did not sleep last night again.  Objective: Vitals:   01/06/17 1159 01/06/17 1201  BP:  112/69  Pulse: 93 95  Resp:    Temp:    SpO2: 96% 99%    Filed Weights   01/04/17 0349 01/05/17 0500 01/06/17 0412  Weight: 70.7 kg (155 lb 12.8 oz) 72.1 kg (158 lb 14.4 oz) 72.7 kg (160 lb 3.2 oz)    ROS: Review of Systems  Unable to perform ROS: Mental acuity  Respiratory: Positive for shortness of breath. Negative for cough.   Cardiovascular: Negative for chest pain.  Gastrointestinal: Negative for abdominal pain.   Exam: Physical Exam  HENT:  Nose: No mucosal edema.  Mouth/Throat: No oropharyngeal exudate or posterior oropharyngeal edema.  Eyes: Conjunctivae, EOM and lids are normal. Pupils are equal, round, and reactive to light.  Neck: No JVD present. Carotid bruit is not present. No edema present. No thyroid mass and no thyromegaly present.  Cardiovascular: S1 normal and S2 normal. Exam reveals no gallop.  No murmur heard. Pulses:      Dorsalis pedis pulses are 2+ on the right side, and 2+ on the left side.  Respiratory: No respiratory distress. She has decreased breath sounds in the right lower field, the left middle field and the left lower field. She has wheezes in the right lower field and the left middle field. She has rhonchi in the left lower field. She has no rales.  GI: Soft. Bowel sounds are normal. There is no tenderness.  Musculoskeletal:       Right ankle: She exhibits swelling.       Left ankle: She exhibits swelling.  Lymphadenopathy:    She has no cervical adenopathy.  Neurological: She is  alert. No cranial nerve deficit.  Skin: Skin is warm. No rash noted. Nails show no clubbing.  Psychiatric:  Confused since the time I saw her last.      Data Reviewed: Basic Metabolic Panel: Recent Labs  Lab 12/16/2016 0636 01/05/17 0556  NA 141 142  K 4.2 3.9  CL 99* 104  CO2 28 26  GLUCOSE 131* 125*  BUN 70* 69*  CREATININE 1.61* 1.60*  CALCIUM 8.8* 8.3*   Liver Function Tests: Recent Labs  Lab 12/22/2016 0636 01/04/17 1516  AST 50* 42*  ALT 132* 94*  ALKPHOS 91 80  BILITOT 1.9* 1.1  PROT 6.9 6.4*  ALBUMIN 3.1* 2.7*   CBC: Recent Labs  Lab 01/06/2017 0636 01/05/17 0556  WBC 5.2 6.1  NEUTROABS  --  4.6  HGB 10.1* 9.5*  HCT 31.5* 30.6*  MCV 94.6 97.1  PLT 103* 96*   Cardiac Enzymes: Recent Labs  Lab 12/11/2016 0636 12/31/2016 1023 12/11/2016 1507 12/20/2016 2149  TROPONINI 0.34* 0.35* 0.28* 0.28*    CBG: Recent Labs  Lab 12/31/16 1135 12/31/16 1656 12/31/16 2115 01/01/17 0825 01/01/17 1147  GLUCAP 164* 103* 148* 132* 108*    Recent Results (from the past 240 hour(s))  Blood culture (routine x 2)     Status: None (Preliminary result)   Collection Time: 01/02/2017  7:45 AM  Result Value Ref  Range Status   Specimen Description BLOOD RIGHT ANTECUBITAL  Final   Special Requests   Final    BOTTLES DRAWN AEROBIC AND ANAEROBIC Blood Culture results may not be optimal due to an excessive volume of blood received in culture bottles   Culture NO GROWTH 3 DAYS  Final   Report Status PENDING  Incomplete  Blood culture (routine x 2)     Status: None (Preliminary result)   Collection Time: 12/27/2016  7:45 AM  Result Value Ref Range Status   Specimen Description BLOOD BLOOD LEFT ARM  Final   Special Requests   Final    BOTTLES DRAWN AEROBIC AND ANAEROBIC Blood Culture adequate volume   Culture NO GROWTH 3 DAYS  Final   Report Status PENDING  Incomplete  Respiratory Panel by PCR     Status: None   Collection Time: 01/05/17  6:01 PM  Result Value Ref Range Status    Adenovirus NOT DETECTED NOT DETECTED Final   Coronavirus 229E NOT DETECTED NOT DETECTED Final   Coronavirus HKU1 NOT DETECTED NOT DETECTED Final   Coronavirus NL63 NOT DETECTED NOT DETECTED Final   Coronavirus OC43 NOT DETECTED NOT DETECTED Final   Metapneumovirus NOT DETECTED NOT DETECTED Final   Rhinovirus / Enterovirus NOT DETECTED NOT DETECTED Final   Influenza A NOT DETECTED NOT DETECTED Final   Influenza B NOT DETECTED NOT DETECTED Final   Parainfluenza Virus 1 NOT DETECTED NOT DETECTED Final   Parainfluenza Virus 2 NOT DETECTED NOT DETECTED Final   Parainfluenza Virus 3 NOT DETECTED NOT DETECTED Final   Parainfluenza Virus 4 NOT DETECTED NOT DETECTED Final   Respiratory Syncytial Virus NOT DETECTED NOT DETECTED Final   Bordetella pertussis NOT DETECTED NOT DETECTED Final   Chlamydophila pneumoniae NOT DETECTED NOT DETECTED Final   Mycoplasma pneumoniae NOT DETECTED NOT DETECTED Final    Comment: Performed at [facility], [place] [address], [place], [place] [address]     Scheduled Meds: . amiodarone  200 mg Oral BID  . aspirin EC  81 mg Oral Daily  . atorvastatin  40 mg Oral Daily  . budesonide (PULMICORT) nebulizer solution  0.5 mg Nebulization BID  . feeding supplement (ENSURE ENLIVE)  237 mL Oral BID BM  . furosemide  20 mg Oral Daily  . ipratropium-albuterol  3 mL Nebulization Q6H  . levothyroxine  50 mcg Oral QAC breakfast  . megestrol  20 mg Oral Daily  . Melatonin  5 mg Oral QHS  . methylPREDNISolone (SOLU-MEDROL) injection  40 mg Intravenous Daily  . midodrine  5 mg Oral TID WC  . multivitamin with minerals  1 tablet Oral Daily  . pantoprazole  40 mg Oral Q1200  . QUEtiapine  25 mg Oral QHS  . sodium chloride flush  10-40 mL Intracatheter Q12H   Continuous Infusions: . ceFEPime (MAXIPIME) IV 1 g (01/06/17 1423)  . vancomycin Stopped (01/05/17 1852)    Assessment/Plan:  1. Acute hypoxic respiratory failure.  Continue oxygen supplementation.   2. Healthcare associated pneumonia.  On vancomycin and cefepime.  First pro-calcitonin is negative but next pro-calcitonin was elevated.  Continue antibiotics.  Send off MRSA PCR so we hopefully can get rid of the vancomycin.  Influenza panel negative and viral respiratory panel negative. 3. Bronchospasm and wheezing.  Continue Solu-Medrol and nebulizer treatments DuoNeb and budesonide 4. Insomnia and hallucinations -increase dose of Seroquel 5. Stage III ovarian cancer 6. Recent non-STEMI troponins trending down.  On low-dose aspirin. 7. Recent GI  bleed stopped Lovenox injections.  On Protonix 8. Atrial fibrillation on amiodarone 9. Hypothyroidism unspecified on levothyroxine 10. Hypotension on midodrine   Code Status:     Code Status Orders  (From admission, onward)        Start     Ordered   12/29/2016 1034  Do not attempt resuscitation (DNR)  Continuous    Question Answer Comment  In the event of cardiac or respiratory ARREST Do not call a "code blue"   In the event of cardiac or respiratory ARREST Do not perform Intubation, CPR, defibrillation or ACLS   In the event of cardiac or respiratory ARREST Use medication by any route, position, wound care, and other measures to relive pain and suffering. May use oxygen, suction and manual treatment of airway obstruction as needed for comfort.   Comments rn may pronounce      01/05/2017 1035    Code Status History    Date Active Date Inactive Code Status Order ID Comments User Context   12/21/2016 18:54 01/01/2017 20:14 DNR [id number]  Shireen Quan, RN Inpatient   12/19/2016 05:26 12/21/2016 18:54 Full Code [id number]  Saundra Shelling, MD Inpatient   11/20/2016 16:39 11/22/2016 19:50 Full Code [id number]  Idelle Crouch, MD ED    Advance Directive Documentation     Most Recent Value  Type of Advance Directive  Healthcare Power of Attorney  Pre-existing out of facility DNR order (yellow form or pink MOST form)  No data  "MOST"  Form in Place?  No data     Family Communication: Spoke with daughter on the phone and given update on plan Disposition Plan: To be determined  Consultants:  Oncology  Palliative care  Antibiotics: Cefepime and vancomycin  Time spent: 27 minutes  [place]

## 2017-01-06 NOTE — Progress Notes (Signed)
Pt. Out of bed to chair with one assist this morning, tolerated well.

## 2017-01-07 ENCOUNTER — Inpatient Hospital Stay: Payer: Medicare Other

## 2017-01-07 ENCOUNTER — Inpatient Hospital Stay: Payer: Medicare Other | Admitting: Oncology

## 2017-01-07 DIAGNOSIS — N289 Disorder of kidney and ureter, unspecified: Secondary | ICD-10-CM

## 2017-01-07 DIAGNOSIS — L8990 Pressure ulcer of unspecified site, unspecified stage: Secondary | ICD-10-CM

## 2017-01-07 DIAGNOSIS — Z7189 Other specified counseling: Secondary | ICD-10-CM

## 2017-01-07 LAB — URINALYSIS, ROUTINE W REFLEX MICROSCOPIC
BILIRUBIN URINE: NEGATIVE
Glucose, UA: NEGATIVE mg/dL
HGB URINE DIPSTICK: NEGATIVE
Ketones, ur: 5 mg/dL — AB
NITRITE: NEGATIVE
PROTEIN: 100 mg/dL — AB
SPECIFIC GRAVITY, URINE: 1.025 (ref 1.005–1.030)
pH: 5 (ref 5.0–8.0)

## 2017-01-07 LAB — BASIC METABOLIC PANEL
ANION GAP: 14 (ref 5–15)
BUN: 85 mg/dL — ABNORMAL HIGH (ref 6–20)
CALCIUM: 9 mg/dL (ref 8.9–10.3)
CO2: 24 mmol/L (ref 22–32)
Chloride: 102 mmol/L (ref 101–111)
Creatinine, Ser: 1.85 mg/dL — ABNORMAL HIGH (ref 0.44–1.00)
GFR calc non Af Amer: 24 mL/min — ABNORMAL LOW (ref >60)
GFR, EST AFRICAN AMERICAN: 28 mL/min — AB (ref >60)
Glucose, Bld: 233 mg/dL — ABNORMAL HIGH (ref 65–99)
POTASSIUM: 4.2 mmol/L (ref 3.5–5.1)
Sodium: 140 mmol/L (ref 135–145)

## 2017-01-07 LAB — PROCALCITONIN: Procalcitonin: 0.12 ng/mL

## 2017-01-07 MED ORDER — CEFEPIME HCL 1 G IJ SOLR
1.0000 g | INTRAMUSCULAR | Status: DC
  Administered 2017-01-08 – 2017-01-11 (×4): 1 g via INTRAVENOUS
  Filled 2017-01-07 (×4): qty 1

## 2017-01-07 MED ORDER — DOCUSATE SODIUM 100 MG PO CAPS
100.0000 mg | ORAL_CAPSULE | Freq: Two times a day (BID) | ORAL | Status: DC
  Administered 2017-01-07 – 2017-01-10 (×6): 100 mg via ORAL
  Filled 2017-01-07 (×8): qty 1

## 2017-01-07 MED ORDER — HALOPERIDOL LACTATE 5 MG/ML IJ SOLN
1.0000 mg | Freq: Four times a day (QID) | INTRAMUSCULAR | Status: DC | PRN
  Administered 2017-01-08 – 2017-01-09 (×3): 1 mg via INTRAVENOUS
  Filled 2017-01-07 (×4): qty 1

## 2017-01-07 NOTE — Progress Notes (Signed)
PHARMACY NOTE:  ANTIMICROBIAL RENAL DOSAGE ADJUSTMENT  Current antimicrobial regimen includes a mismatch between antimicrobial dosage and estimated renal function.  As per policy approved by the Pharmacy & Therapeutics and Medical Executive Committees, the antimicrobial dosage will be adjusted accordingly.  Current antimicrobial dosage:  Cefepime 1g IV Q8hr.   Indication: HCAP  Renal Function:  Estimated Creatinine Clearance: 24.6 mL/min (A) (by C-G formula based on SCr of 1.85 mg/dL (H)). []      On intermittent HD, scheduled: []      On CRRT    Antimicrobial dosage has been changed to:  Cefepime 1g IV Q24hr.   Additional comments:   Thank you for allowing pharmacy to be a part of this patient's care.  Wyland Rastetter L, [facility] 01/07/2017 3:46 PM

## 2017-01-07 NOTE — Progress Notes (Signed)
Physical Therapy Treatment Patient Details Name: Ebony Navarro MRN: [id number] DOB: [birth year] Today's Date: 01/07/2017    History of Present Illness Pt admitted for complaints of SOB with positive pneumonia. Recent hosptial admission with similar symptoms and was dc'd to SNF. History includes breast cancer, ovarian cancer, NSTEMI, and HTN. Troponins are downtrending at this time    PT Comments    Pt is making poor progress towards goals secondary to lethargy. Pt very confused and only able to participate briefly with heavy external facilitation. O2 sats at 94% on RA post breathing treatment. O2 reapplied as pt with shallow breathing. Caregiver in room. Only able to tolerate limited there-ex this date. Will continue as appropriate.   Follow Up Recommendations  SNF     Equipment Recommendations       Recommendations for Other Services       Precautions / Restrictions Precautions Precautions: Fall Restrictions Weight Bearing Restrictions: No    Mobility  Bed Mobility               General bed mobility comments: unable to attempt secondary to lethargy  Transfers                    Ambulation/Gait                 Stairs            Wheelchair Mobility    Modified Rankin (Stroke Patients Only)       Balance                                            Cognition Arousal/Alertness: Lethargic Behavior During Therapy: WFL for tasks assessed/performed Overall Cognitive Status: Impaired/Different from baseline                                 General Comments: picking at objects in the air      Exercises Other Exercises Other Exercises: agreeable to supine there-x, however limited participation due to lethargy. Pt needs frequent tactile/verbal cues for alertness to participate. Attempted ther-ex included R LE SAQ, hip abd/add, and heel slides. All ther-ex performed x 10 reps with max assist.    General  Comments        Pertinent Vitals/Pain Pain Assessment: No/denies pain    Home Living                      Prior Function            PT Goals (current goals can now be found in the care plan section) Acute Rehab PT Goals Patient Stated Goal: to return back to PLOF PT Goal Formulation: With patient Time For Goal Achievement: 01/19/17 Potential to Achieve Goals: Fair Progress towards PT goals: Progressing toward goals    Frequency    Min 2X/week      PT Plan Current plan remains appropriate    Co-evaluation              AM-PAC PT "6 Clicks" Daily Activity  Outcome Measure  Difficulty turning over in bed (including adjusting bedclothes, sheets and blankets)?: Unable Difficulty moving from lying on back to sitting on the side of the bed? : Unable Difficulty sitting down on and standing up from a chair with arms (e.g., wheelchair,  bedside commode, etc,.)?: Unable Help needed moving to and from a bed to chair (including a wheelchair)?: A Lot Help needed walking in hospital room?: A Lot Help needed climbing 3-5 steps with a railing? : A Lot 6 Click Score: 9    End of Session Equipment Utilized During Treatment: Oxygen Activity Tolerance: Patient limited by lethargy Patient left: in bed;with bed alarm set;with family/visitor present Nurse Communication: Mobility status PT Visit Diagnosis: Unsteadiness on feet (R26.81);Muscle weakness (generalized) (M62.81);Difficulty in walking, not elsewhere classified (R26.2)     Time: 5883-2549 PT Time Calculation (min) (ACUTE ONLY): 10 min  Charges:  $Therapeutic Exercise: 8-22 mins                    G Codes:  Functional Assessment Tool Used: AM-PAC 6 Clicks Basic Mobility Functional Limitation: Mobility: Walking and moving around Mobility: Walking and Moving Around Current Status (I2641): At least 60 percent but less than 80 percent impaired, limited or restricted Mobility: Walking and Moving Around Goal Status  [phone]): At least 20 percent but less than 40 percent impaired, limited or restricted    Greggory Stallion, PT, DPT [phone]    Ebony Navarro 01/07/2017, 2:14 PM

## 2017-01-07 NOTE — Progress Notes (Addendum)
Hematology/Oncology Progress Note [facility] [phone] [phone]  Patient Care Team: Adin Hector, MD as PCP - General (Internal Medicine) Clent Jacks, RN as Registered Nurse   Name of the patient: Ebony Navarro  [id number]  Dec 28, 1933  Date of visit: 01/07/17  INTERVAL HISTORY-  Patient appears lethargic, and confused. Per RN, she stayed awake last night. She can not recognize me, oriented x 0. She is drinking ensure with assistance. She belches a lot.  On haldol PRN. Seroquel.  Family members were not at bedside   Review of systems- ROS Cannot obtain ROS as patient is larthargic   Allergies  Allergen Reactions  . Ace Inhibitors Other (See Comments)    hyperkalemia  . Atenolol Other (See Comments)    Worsening palpitations  . Iodine Other (See Comments)    11/20/16: per conversation with pt, pt with allergy to topical iodine and betadine.  Pt states she has had IV contrast in the past with now issues.  . Metoprolol Tartrate Other (See Comments)    intolerant  . Omeprazole Diarrhea  . Povidone-Iodine Other (See Comments)  . Rofecoxib Nausea Only  . Ciprofloxacin Rash  . Doxycycline Calcium Rash  . Nitrofurantoin Rash  . Quinolones Rash  . Sulfa Antibiotics Rash    Patient Active Problem List   Diagnosis Date Noted  . Pressure injury of skin 01/07/2017  . Acute respiratory failure ([place]) 12/20/2016  . Multifocal pneumonia   . Neutropenia, drug-induced ([place])   . Thrombocytopenia ([place])   . Acute respiratory failure with hypoxemia ([place])   . Shortness of breath   . NSTEMI (non-ST elevated myocardial infarction) ([place]) 12/19/2016  . Ovarian cancer ([place]) 11/24/2016  . Malignant neoplasm of ovary ([place]) 11/24/2016  . Abdominal pain 11/20/2016  . Ascites 11/20/2016  . Ovarian mass 11/20/2016  . Absolute anemia 01/22/2015  . Anxiety 01/22/2015  . Malignant neoplasm of breast ([place]) 01/22/2015  . Clinical  depression 01/22/2015  . Gastric catarrh 01/22/2015  . Blood glucose elevated 01/22/2015  . HLD (hyperlipidemia) 01/22/2015  . BP (high blood pressure) 01/22/2015  . Acquired atrophy of thyroid 04/02/2014  . Frequent UTI 11/23/2013  . Chronic kidney disease (CKD), stage III (moderate) ([place]) 10/02/2013     Past Medical History:  Diagnosis Date  . Breast cancer ([place]) 2005   Right, radiation and lumpectomy  . Breast cancer ([place]) 2002   Left, Chemo, radiation and lumpectomy  . Cancer ([place])   . Hyperlipidemia   . Hypertension   . Ovarian cancer ([place]) 11/24/2016     Past Surgical History:  Procedure Laterality Date  . BREAST BIOPSY Left 2010   negative stereotactic biopsy  . BREAST LUMPECTOMY Right 2005   positive  . BREAST LUMPECTOMY Left 2002   positive  . PORTA CATH INSERTION N/A 12/09/2016   Procedure: PORTA CATH INSERTION;  Surgeon: Algernon Huxley, MD;  Location: [facility];  Service: Cardiovascular;  Laterality: N/A;    Social History   Socioeconomic History  . Marital status: Widowed    Spouse name: Not on file  . Number of children: Not on file  . Years of education: Not on file  . Highest education level: Not on file  Social Needs  . Financial resource strain: Not on file  . Food insecurity - worry: Not on file  . Food insecurity - inability: Not on file  . Transportation needs - medical: Not on file  . Transportation needs - non-medical:  Not on file  Occupational History  . Occupation: retired  Tobacco Use  . Smoking status: Never Smoker  . Smokeless tobacco: Never Used  Substance and Sexual Activity  . Alcohol use: No  . Drug use: No  . Sexual activity: No    Birth control/protection: Post-menopausal  Other Topics Concern  . Not on file  Social History Narrative  . Not on file     Family History  Problem Relation Age of Onset  . Hypertension Mother   . Hypertension Father      Current Facility-Administered Medications:  .   acetaminophen (TYLENOL) tablet 650 mg, 650 mg, Oral, Q6H PRN, Wieting, Richard, MD .  ALPRAZolam Duanne Moron) tablet 0.25 mg, 0.25 mg, Oral, TID PRN, Gouru, Aruna, MD, 0.25 mg at 01/06/17 2209 .  amiodarone (PACERONE) tablet 200 mg, 200 mg, Oral, BID, Gouru, Aruna, MD, 200 mg at 01/07/17 0859 .  aspirin EC tablet 81 mg, 81 mg, Oral, Daily, Gouru, Aruna, MD, 81 mg at 01/07/17 0858 .  atorvastatin (LIPITOR) tablet 40 mg, 40 mg, Oral, Daily, Gouru, Aruna, MD, 40 mg at 01/07/17 0859 .  budesonide (PULMICORT) nebulizer solution 0.5 mg, 0.5 mg, Nebulization, BID, Leslye Peer, Richard, MD, 0.5 mg at 01/07/17 0740 .  [START ON 01/08/2017] ceFEPIme (MAXIPIME) 1 g in dextrose 5 % 50 mL IVPB, 1 g, Intravenous, Q24H, Wieting, Richard, MD .  docusate sodium (COLACE) capsule 100 mg, 100 mg, Oral, BID, Leslye Peer, Richard, MD, 100 mg at 01/07/17 1230 .  feeding supplement (ENSURE ENLIVE) (ENSURE ENLIVE) liquid 237 mL, 237 mL, Oral, BID BM, Gouru, Aruna, MD, 237 mL at 01/07/17 1101 .  furosemide (LASIX) tablet 20 mg, 20 mg, Oral, Daily, Gouru, Aruna, MD, 20 mg at 01/07/17 0859 .  guaiFENesin-dextromethorphan (ROBITUSSIN DM) 100-10 MG/5ML syrup 5 mL, 5 mL, Oral, Q4H PRN, Gouru, Aruna, MD, 5 mL at 01/05/17 0413 .  haloperidol lactate (HALDOL) injection 1 mg, 1 mg, Intravenous, Q6H PRN, Wieting, Richard, MD .  ipratropium-albuterol (DUONEB) 0.5-2.5 (3) MG/3ML nebulizer solution 3 mL, 3 mL, Nebulization, Q6H, Wieting, Richard, MD, 3 mL at 01/07/17 0740 .  levothyroxine (SYNTHROID, LEVOTHROID) tablet 50 mcg, 50 mcg, Oral, QAC breakfast, Gouru, Aruna, MD, 50 mcg at 01/07/17 0506 .  megestrol (MEGACE) tablet 20 mg, 20 mg, Oral, Daily, Gouru, Aruna, MD, 20 mg at 01/07/17 1110 .  Melatonin TABS 5 mg, 5 mg, Oral, QHS, Gouru, Aruna, MD, 5 mg at 01/06/17 2209 .  midodrine (PROAMATINE) tablet 5 mg, 5 mg, Oral, TID WC, Gouru, Aruna, MD, 5 mg at 01/07/17 1230 .  multivitamin with minerals tablet 1 tablet, 1 tablet, Oral, Daily, Gouru, Aruna,  MD, 1 tablet at 01/07/17 0859 .  nitroGLYCERIN (NITROSTAT) SL tablet 0.4 mg, 0.4 mg, Sublingual, Q5 Min x 3 PRN, Gouru, Aruna, MD .  ondansetron (ZOFRAN) tablet 4 mg, 4 mg, Oral, Q8H PRN, Gouru, Aruna, MD, 4 mg at 01/04/17 0945 .  oxyCODONE-acetaminophen (PERCOCET/ROXICET) 5-325 MG per tablet 1 tablet, 1 tablet, Oral, Q4H PRN, Salary, Montell D, MD, 1 tablet at 01/07/17 0324 .  pantoprazole (PROTONIX) EC tablet 40 mg, 40 mg, Oral, Q1200, Gouru, Aruna, MD, 40 mg at 01/07/17 1230 .  QUEtiapine (SEROQUEL) tablet 25 mg, 25 mg, Oral, QHS, Wieting, Richard, MD, 25 mg at 01/06/17 2209 .  sodium chloride flush (NS) 0.9 % injection 10-40 mL, 10-40 mL, Intracatheter, Q12H, Wieting, Richard, MD, 10 mL at 01/07/17 1102 .  sodium chloride flush (NS) 0.9 % injection 10-40 mL, 10-40 mL, Intracatheter, PRN, Wieting,  Richard, MD   Physical exam:  Vitals:   01/07/17 0500 01/07/17 0503 01/07/17 0740 01/07/17 0815  BP:  (!) 154/67  (!) 102/54  Pulse:  95  90  Resp:    20  Temp:  (!) 97.5 F (36.4 C)  97.7 F (36.5 C)  TempSrc:  Oral    SpO2:  96% 93% 95%  Weight: 163 lb 12.8 oz (74.3 kg)     Height:       GENERAL:Alert, no distress and comfortable.  EYES: no pallor or icterus OROPHARYNX: no thrush or ulceration; good dentition  NECK: supple, no masses felt LYMPH:  no palpable lymphadenopathy in the cervical, axillary or inguinal regions LUNGS: clear to auscultation and  No wheeze or crackles HEART/CVS: regular rate & rhythm and no murmurs; No lower extremity edema ABDOMEN: abdomen soft, non-tender, mildly distended.  Musculoskeletal:no cyanosis of digits and no clubbing  PSYCH: lethargic. NEURO: no focal motor/sensory deficits SKIN:  no rashes or significant lesions     CMP Latest Ref Rng & Units 01/07/2017  Glucose 65 - 99 mg/dL 233(H)  BUN 6 - 20 mg/dL 85(H)  Creatinine 0.44 - 1.00 mg/dL 1.85(H)  Sodium 135 - 145 mmol/L 140  Potassium 3.5 - 5.1 mmol/L 4.2  Chloride 101 - 111 mmol/L 102    CO2 22 - 32 mmol/L 24  Calcium 8.9 - 10.3 mg/dL 9.0  Total Protein 6.5 - 8.1 g/dL -  Total Bilirubin 0.3 - 1.2 mg/dL -  Alkaline Phos 38 - 126 U/L -  AST 15 - 41 U/L -  ALT 14 - 54 U/L -   CBC Latest Ref Rng & Units 01/05/2017  WBC 3.6 - 11.0 K/uL 6.1  Hemoglobin 12.0 - 16.0 g/dL 9.5(L)  Hematocrit 35.0 - 47.0 % 30.6(L)  Platelets 150 - 440 K/uL 96(L)      Assessment and plan- Patient is a83 y.o.femalewith remote history of breast cancer, recently diagnosed with stage III ovarian cancer being treated with curative intent status, post 2 cycles of chemotherapy, recently NSTEMI/CHF, GI bleeding presents with worsening SOB. CT showed multifocal consolidation.    # Thrombocytopenia: Stable. Continue Aspirin. Continue DVT prophylaxis.  # acute respiratory failure,  multifocal pneumonia on cefepime. .  Blood culture negative so far. On nasal cannula oxygen, oxygen saturation stable.   #Stage III ovarian cancer: no plan for chemotherapy.  recommend consult palliative care for symptom control in transition to hospice. Patient continues to decline and may never be well enough to tolerate additional chemotherapy. Without chemotherapy, her ovarian cancer will progress and cause more symptoms and contributes to further deterioration. In fact, patient's belching symptom has already increased frequency, which was her initial presenting symptoms and has previously improved with treatment.   # Delirium: haldol PRN and Seroquel  # NSTEMI/CHF/hypotension: on midodrine for BP support # Renal insufficiency: possible secondary to hypotension vs vancomycin induced renal toxicity. She is off Vancomycin  At this point, prognosis is poor. I recommend palliative care consult to discuss goal of care and she is hospice appropriate.  # CODE STATUS:DNR/DNI.Plan discussed with primary team attending Dr. Leslye Peer  Will continue to follow along her inpatient course. Thank you for involving me in this  patient's care. Dr.Corcoran will be covering me this weekend.   Addendum:  # Goal of Care discussion: called patient's kelly and updated patient's situation. I had lenghty discussion with her. She is aware that patient's status continues to decline and at this point, chances of her getting better is  slim. I suggest her to talk to palliative care for goal of care and in transition to hospice. She voices understanding and want to think about this over the weekend.  Total face to face encounter time for this patient visit was 50 min. >50% of the time was  spent in counseling and coordination of care.    Earlie Server, MD, PhD [facility] Pager- [phone] 01/07/2017

## 2017-01-07 NOTE — Progress Notes (Signed)
Patient ID: Ebony Navarro, female   DOB: [birth year], 83 y.o.   MRN: [id number]   [facility] PROGRESS NOTE  Ebony Navarro YSA:[id number] DOB: 1933-09-08 DOA: 01/07/2017 PCP: Tama High III, MD  HPI/Subjective: Patient trying to sleep, did not sleep last night. Answers a few questions.  Answers yes to shortness of breath and abdominal pain  Objective: Vitals:   01/07/17 0740 01/07/17 0815  BP:  (!) 102/54  Pulse:  90  Resp:  20  Temp:  97.7 F (36.5 C)  SpO2: 93% 95%    Filed Weights   01/05/17 0500 01/06/17 0412 01/07/17 0500  Weight: 72.1 kg (158 lb 14.4 oz) 72.7 kg (160 lb 3.2 oz) 74.3 kg (163 lb 12.8 oz)    ROS: Review of Systems  Unable to perform ROS: Mental acuity  Respiratory: Positive for shortness of breath. Negative for cough.   Cardiovascular: Negative for chest pain.  Gastrointestinal: Positive for abdominal pain.   Exam: Physical Exam  HENT:  Nose: No mucosal edema.  Mouth/Throat: No oropharyngeal exudate or posterior oropharyngeal edema.  Eyes: Conjunctivae, EOM and lids are normal. Pupils are equal, round, and reactive to light.  Neck: No JVD present. Carotid bruit is not present. No edema present. No thyroid mass and no thyromegaly present.  Cardiovascular: S1 normal and S2 normal. Exam reveals no gallop.  No murmur heard. Pulses:      Dorsalis pedis pulses are 2+ on the right side, and 2+ on the left side.  Respiratory: No respiratory distress. She has decreased breath sounds in the right lower field, the left middle field and the left lower field. She has wheezes in the right lower field and the left middle field. She has rhonchi in the left lower field. She has no rales.  GI: Soft. Bowel sounds are normal. There is no tenderness.  Musculoskeletal:       Right ankle: She exhibits swelling.       Left ankle: She exhibits swelling.  Lymphadenopathy:    She has no cervical adenopathy.  Neurological: She is alert. No cranial nerve  deficit.  Skin: Skin is warm. No rash noted. Nails show no clubbing.  Psychiatric:  Confused since the time I saw her last.      Data Reviewed: Basic Metabolic Panel: Recent Labs  Lab 01/05/2017 0636 01/05/17 0556 01/07/17 0437  NA 141 142 140  K 4.2 3.9 4.2  CL 99* 104 102  CO2 28 26 24   GLUCOSE 131* 125* 233*  BUN 70* 69* 85*  CREATININE 1.61* 1.60* 1.85*  CALCIUM 8.8* 8.3* 9.0   Liver Function Tests: Recent Labs  Lab 12/28/2016 0636 01/04/17 1516  AST 50* 42*  ALT 132* 94*  ALKPHOS 91 80  BILITOT 1.9* 1.1  PROT 6.9 6.4*  ALBUMIN 3.1* 2.7*   CBC: Recent Labs  Lab 12/27/2016 0636 01/05/17 0556  WBC 5.2 6.1  NEUTROABS  --  4.6  HGB 10.1* 9.5*  HCT 31.5* 30.6*  MCV 94.6 97.1  PLT 103* 96*   Cardiac Enzymes: Recent Labs  Lab 12/13/2016 0636 12/20/2016 1023 12/27/2016 1507 12/13/2016 2149  TROPONINI 0.34* 0.35* 0.28* 0.28*    CBG: Recent Labs  Lab 12/31/16 1656 12/31/16 2115 01/01/17 0825 01/01/17 1147  GLUCAP 103* 148* 132* 108*    Recent Results (from the past 240 hour(s))  Blood culture (routine x 2)     Status: None (Preliminary result)   Collection Time: 12/14/2016  7:45 AM  Result Value Ref Range  Status   Specimen Description BLOOD RIGHT ANTECUBITAL  Final   Special Requests   Final    BOTTLES DRAWN AEROBIC AND ANAEROBIC Blood Culture results may not be optimal due to an excessive volume of blood received in culture bottles   Culture NO GROWTH 4 DAYS  Final   Report Status PENDING  Incomplete  Blood culture (routine x 2)     Status: None (Preliminary result)   Collection Time: 12/14/2016  7:45 AM  Result Value Ref Range Status   Specimen Description BLOOD BLOOD LEFT ARM  Final   Special Requests   Final    BOTTLES DRAWN AEROBIC AND ANAEROBIC Blood Culture adequate volume   Culture NO GROWTH 4 DAYS  Final   Report Status PENDING  Incomplete  Respiratory Panel by PCR     Status: None   Collection Time: 01/05/17  6:01 PM  Result Value Ref Range  Status   Adenovirus NOT DETECTED NOT DETECTED Final   Coronavirus 229E NOT DETECTED NOT DETECTED Final   Coronavirus HKU1 NOT DETECTED NOT DETECTED Final   Coronavirus NL63 NOT DETECTED NOT DETECTED Final   Coronavirus OC43 NOT DETECTED NOT DETECTED Final   Metapneumovirus NOT DETECTED NOT DETECTED Final   Rhinovirus / Enterovirus NOT DETECTED NOT DETECTED Final   Influenza A NOT DETECTED NOT DETECTED Final   Influenza B NOT DETECTED NOT DETECTED Final   Parainfluenza Virus 1 NOT DETECTED NOT DETECTED Final   Parainfluenza Virus 2 NOT DETECTED NOT DETECTED Final   Parainfluenza Virus 3 NOT DETECTED NOT DETECTED Final   Parainfluenza Virus 4 NOT DETECTED NOT DETECTED Final   Respiratory Syncytial Virus NOT DETECTED NOT DETECTED Final   Bordetella pertussis NOT DETECTED NOT DETECTED Final   Chlamydophila pneumoniae NOT DETECTED NOT DETECTED Final   Mycoplasma pneumoniae NOT DETECTED NOT DETECTED Final    Comment: Performed at [facility], [place] [address] [place], [place] [address]     Scheduled Meds: . amiodarone  200 mg Oral BID  . aspirin EC  81 mg Oral Daily  . atorvastatin  40 mg Oral Daily  . budesonide (PULMICORT) nebulizer solution  0.5 mg Nebulization BID  . docusate sodium  100 mg Oral BID  . feeding supplement (ENSURE ENLIVE)  237 mL Oral BID BM  . furosemide  20 mg Oral Daily  . ipratropium-albuterol  3 mL Nebulization Q6H  . levothyroxine  50 mcg Oral QAC breakfast  . megestrol  20 mg Oral Daily  . Melatonin  5 mg Oral QHS  . midodrine  5 mg Oral TID WC  . multivitamin with minerals  1 tablet Oral Daily  . pantoprazole  40 mg Oral Q1200  . QUEtiapine  25 mg Oral QHS  . sodium chloride flush  10-40 mL Intracatheter Q12H   Continuous Infusions: . [START ON 01/08/2017] ceFEPime (MAXIPIME) IV      Assessment/Plan:  1. Acute hypoxic respiratory failure.  Continue oxygen supplementation.  2. Healthcare associated pneumonia.  On cefepime.  Procalcitonin elevated  and  Continue antibiotics. Influenza panel negative and viral respiratory panel negative. 3. Bronchospasm and wheezing.  Continue nebulizer treatments DuoNeb and budesonide. Stop solumedrol. 4. Acute delerium, Insomnia and hallucinations -haldol prn and nightly Seroquel 5. Stage III ovarian cancer 6. Recent non-STEMI troponins trending down.  On low-dose aspirin. 7. Recent GI bleed stopped Lovenox injections.  On Protonix 8. Atrial fibrillation on amiodarone 9. Hypothyroidism unspecified on levothyroxine 10. Hypotension on midodrine  I don't think the patient  will survive this hospital stay. I think patient knows that she is not progressing and may be giving up hope.   Code Status:     Code Status Orders  (From admission, onward)        Start     Ordered   12/24/2016 1034  Do not attempt resuscitation (DNR)  Continuous    Question Answer Comment  In the event of cardiac or respiratory ARREST Do not call a "code blue"   In the event of cardiac or respiratory ARREST Do not perform Intubation, CPR, defibrillation or ACLS   In the event of cardiac or respiratory ARREST Use medication by any route, position, wound care, and other measures to relive pain and suffering. May use oxygen, suction and manual treatment of airway obstruction as needed for comfort.   Comments rn may pronounce      12/18/2016 1035    Code Status History    Date Active Date Inactive Code Status Order ID Comments User Context   12/21/2016 18:54 01/01/2017 20:14 DNR [id number]  Shireen Quan, RN Inpatient   12/19/2016 05:26 12/21/2016 18:54 Full Code [id number]  Saundra Shelling, MD Inpatient   11/20/2016 16:39 11/22/2016 19:50 Full Code [id number]  Idelle Crouch, MD ED    Advance Directive Documentation     Most Recent Value  Type of Advance Directive  Healthcare Power of Attorney  Pre-existing out of facility DNR order (yellow form or pink MOST form)  No data  "MOST" Form in Place?  No data     Family  Communication: Spoke with daughter on the phone and given update on plan Disposition Plan: To be determined  Consultants:  Oncology  Palliative care  Antibiotics: Cefepime  Time spent: 28 minutes  [place]

## 2017-01-07 NOTE — Plan of Care (Signed)
  Progressing Clinical Measurements: Will remain free from infection 01/07/2017 1820 - Progressing by Gildardo Pounds, RN   Not Progressing Education: Knowledge of General Education information will improve 01/07/2017 1820 - Not Progressing by Gildardo Pounds, RN Clinical Measurements: Ability to maintain clinical measurements within normal limits will improve 01/07/2017 1820 - Not Progressing by Gildardo Pounds, RN

## 2017-01-07 NOTE — Progress Notes (Signed)
Patient complains of Shortness of breath. Spo2 100% on 3LNC. Does not appear to be in distress but continues to state that she needs help.Appears to be anxious and verbalized that she feels uneasy. PRN xanax given will continue to monitor.

## 2017-01-08 LAB — CULTURE, BLOOD (ROUTINE X 2)
CULTURE: NO GROWTH
CULTURE: NO GROWTH
SPECIAL REQUESTS: ADEQUATE

## 2017-01-08 NOTE — Progress Notes (Signed)
Patient keeps on saying "I want to pee." Patient unable to urinate after several attempts. Bladder scan read 479 mL. Dr. Marcille Blanco notified with a new order for in and out cath x 1 dose. In and out yielded 500 mL. Patient tolerated the procedure well. Will continue to monitor.

## 2017-01-08 NOTE — Progress Notes (Signed)
Pt restless, has not voided since I&O cath earlier this am.  Bladder scan show >150, spoke with Dr. Judd Gaudier, orders received to insert catheter.  Pt tolerated well, family updated.

## 2017-01-08 NOTE — Progress Notes (Signed)
Pt complaining of not being able to breath, very anxious, O2 sat 96 on 3L.  Xanax .25mg  po given to help calm pt.

## 2017-01-08 NOTE — Progress Notes (Signed)
Pt very restless, calling out random items.  Haldol 1mg  IV given, family at bedside, will monitor.

## 2017-01-08 NOTE — Plan of Care (Signed)
  Progressing Clinical Measurements: Ability to maintain a body temperature in the normal range will improve 01/08/2017 1108 - Progressing by Etheleen Nicks, RN Note Remains afebrile   Not Progressing Clinical Measurements: Will remain free from infection 01/08/2017 1108 - Not Progressing by Etheleen Nicks, RN Note Remains on 2 IV antibiotics at this time Respiratory complications will improve 01/08/2017 1108 - Not Progressing by Etheleen Nicks, RN Note Remains on 3L oxygen per [place]

## 2017-01-08 NOTE — Progress Notes (Signed)
Resting comfortably will continue to monitor

## 2017-01-08 NOTE — Progress Notes (Signed)
Patient ID: Ebony Navarro, female   DOB: April 29, 1933, 83 y.o.   MRN: [id number]   [facility] PROGRESS NOTE  Ebony Navarro DGU:[id number] DOB: 1933/08/28 DOA: 12/13/2016 PCP: Adin Hector, MD  HPI/Subjective: Last night was agitated and had urinary retention- after I/o catheter- and some anxiety and pain mes injection- she is sleeping comfortably now.  Objective: Vitals:   01/08/17 0747 01/08/17 0754  BP: 112/62   Pulse:    Resp: 17   Temp: 98.2 F (36.8 C)   SpO2: 96% 94%    Filed Weights   01/06/17 0412 01/07/17 0500 01/08/17 0634  Weight: 72.7 kg (160 lb 3.2 oz) 74.3 kg (163 lb 12.8 oz) 73.8 kg (162 lb 12.8 oz)    ROS: Review of Systems  Unable to perform ROS: Mental acuity   Exam: Physical Exam  HENT:  Nose: No mucosal edema.  Mouth/Throat: No oropharyngeal exudate or posterior oropharyngeal edema.  Eyes: Conjunctivae, EOM and lids are normal. Pupils are equal, round, and reactive to light.  Neck: No JVD present. Carotid bruit is not present. No edema present. No thyroid mass and no thyromegaly present.  Cardiovascular: S1 normal and S2 normal. Exam reveals no gallop.  No murmur heard. Pulses:      Dorsalis pedis pulses are 2+ on the right side, and 2+ on the left side.  Respiratory: No respiratory distress. She has decreased breath sounds in the right lower field, the left middle field and the left lower field. She has wheezes in the right lower field and the left middle field. She has rhonchi in the left lower field. She has no rales.  GI: Soft. Bowel sounds are normal. There is no tenderness.  Musculoskeletal:       Right ankle: She exhibits swelling.       Left ankle: She exhibits swelling.  Lymphadenopathy:    She has no cervical adenopathy.  Neurological:  Pt is sleepy now.  Skin: Skin is warm. No rash noted. Nails show no clubbing.  Psychiatric:  Sleepy now,.      Data Reviewed: Basic Metabolic Panel: Recent Labs  Lab  01/07/2017 0636 01/05/17 0556 01/07/17 0437  NA 141 142 140  K 4.2 3.9 4.2  CL 99* 104 102  CO2 28 26 24   GLUCOSE 131* 125* 233*  BUN 70* 69* 85*  CREATININE 1.61* 1.60* 1.85*  CALCIUM 8.8* 8.3* 9.0   Liver Function Tests: Recent Labs  Lab 12/18/2016 0636 01/04/17 1516  AST 50* 42*  ALT 132* 94*  ALKPHOS 91 80  BILITOT 1.9* 1.1  PROT 6.9 6.4*  ALBUMIN 3.1* 2.7*   CBC: Recent Labs  Lab 12/11/2016 0636 01/05/17 0556  WBC 5.2 6.1  NEUTROABS  --  4.6  HGB 10.1* 9.5*  HCT 31.5* 30.6*  MCV 94.6 97.1  PLT 103* 96*   Cardiac Enzymes: Recent Labs  Lab 12/31/2016 0636 12/24/2016 1023 12/28/2016 1507 12/21/2016 2149  TROPONINI 0.34* 0.35* 0.28* 0.28*    CBG: Recent Labs  Lab 01/01/17 1147  GLUCAP 108*    Recent Results (from the past 240 hour(s))  Blood culture (routine x 2)     Status: None   Collection Time: 12/14/2016  7:45 AM  Result Value Ref Range Status   Specimen Description BLOOD RIGHT ANTECUBITAL  Final   Special Requests   Final    BOTTLES DRAWN AEROBIC AND ANAEROBIC Blood Culture results may not be optimal due to an excessive volume of blood received in culture bottles  Culture NO GROWTH 5 DAYS  Final   Report Status 01/08/2017 FINAL  Final  Blood culture (routine x 2)     Status: None   Collection Time: 12/25/2016  7:45 AM  Result Value Ref Range Status   Specimen Description BLOOD BLOOD LEFT ARM  Final   Special Requests   Final    BOTTLES DRAWN AEROBIC AND ANAEROBIC Blood Culture adequate volume   Culture NO GROWTH 5 DAYS  Final   Report Status 01/08/2017 FINAL  Final  Respiratory Panel by PCR     Status: None   Collection Time: 01/05/17  6:01 PM  Result Value Ref Range Status   Adenovirus NOT DETECTED NOT DETECTED Final   Coronavirus 229E NOT DETECTED NOT DETECTED Final   Coronavirus HKU1 NOT DETECTED NOT DETECTED Final   Coronavirus NL63 NOT DETECTED NOT DETECTED Final   Coronavirus OC43 NOT DETECTED NOT DETECTED Final   Metapneumovirus NOT DETECTED  NOT DETECTED Final   Rhinovirus / Enterovirus NOT DETECTED NOT DETECTED Final   Influenza A NOT DETECTED NOT DETECTED Final   Influenza B NOT DETECTED NOT DETECTED Final   Parainfluenza Virus 1 NOT DETECTED NOT DETECTED Final   Parainfluenza Virus 2 NOT DETECTED NOT DETECTED Final   Parainfluenza Virus 3 NOT DETECTED NOT DETECTED Final   Parainfluenza Virus 4 NOT DETECTED NOT DETECTED Final   Respiratory Syncytial Virus NOT DETECTED NOT DETECTED Final   Bordetella pertussis NOT DETECTED NOT DETECTED Final   Chlamydophila pneumoniae NOT DETECTED NOT DETECTED Final   Mycoplasma pneumoniae NOT DETECTED NOT DETECTED Final    Comment: Performed at [facility], [place] [address] [place], [place] [address]     Scheduled Meds: . amiodarone  200 mg Oral BID  . aspirin EC  81 mg Oral Daily  . atorvastatin  40 mg Oral Daily  . budesonide (PULMICORT) nebulizer solution  0.5 mg Nebulization BID  . docusate sodium  100 mg Oral BID  . feeding supplement (ENSURE ENLIVE)  237 mL Oral BID BM  . furosemide  20 mg Oral Daily  . ipratropium-albuterol  3 mL Nebulization Q6H  . levothyroxine  50 mcg Oral QAC breakfast  . megestrol  20 mg Oral Daily  . Melatonin  5 mg Oral QHS  . midodrine  5 mg Oral TID WC  . multivitamin with minerals  1 tablet Oral Daily  . pantoprazole  40 mg Oral Q1200  . QUEtiapine  25 mg Oral QHS  . sodium chloride flush  10-40 mL Intracatheter Q12H   Continuous Infusions: . ceFEPime (MAXIPIME) IV Stopped (01/08/17 0109)    Assessment/Plan:  1. Acute hypoxic respiratory failure.  Continue oxygen supplementation.  2. Healthcare associated pneumonia.  On cefepime.  Procalcitonin elevated and  Continue antibiotics. Influenza panel negative and viral respiratory panel negative. 3. Bronchospasm and wheezing.  Continue nebulizer treatments DuoNeb and budesonide. Stop solumedrol. 4. Acute delerium, Insomnia and hallucinations -haldol prn and nightly Seroquel, also had some  urinary retention, so this may be the reason-  We will watch more- if needed, may place foley. 5. Stage III ovarian cancer 6. Recent non-STEMI troponins trending down.  On low-dose aspirin. 7. Recent GI bleed stopped Lovenox injections.  On Protonix 8. Atrial fibrillation on amiodarone 9. Hypothyroidism unspecified on levothyroxine 10. Hypotension on midodrine  I don't think the patient will survive this hospital stay. I think patient knows that she is not progressing and may be giving up hope.   Code Status:  Code Status Orders  (From admission, onward)        Start     Ordered   12/11/2016 1034  Do not attempt resuscitation (DNR)  Continuous    Question Answer Comment  In the event of cardiac or respiratory ARREST Do not call a "code blue"   In the event of cardiac or respiratory ARREST Do not perform Intubation, CPR, defibrillation or ACLS   In the event of cardiac or respiratory ARREST Use medication by any route, position, wound care, and other measures to relive pain and suffering. May use oxygen, suction and manual treatment of airway obstruction as needed for comfort.   Comments rn may pronounce      01/06/2017 1035    Code Status History    Date Active Date Inactive Code Status Order ID Comments User Context   12/21/2016 18:54 01/01/2017 20:14 DNR [id number]  Shireen Quan, RN Inpatient   12/19/2016 05:26 12/21/2016 18:54 Full Code [id number]  Saundra Shelling, MD Inpatient   11/20/2016 16:39 11/22/2016 19:50 Full Code [id number]  Idelle Crouch, MD ED    Advance Directive Documentation     Most Recent Value  Type of Advance Directive  Healthcare Power of Attorney  Pre-existing out of facility DNR order (yellow form or pink MOST form)  No data  "MOST" Form in Place?  No data     Family Communication: Spoke with daughter on the phone and given update on plan Disposition Plan: To be determined  Consultants:  Oncology  Palliative  care  Antibiotics: Cefepime  Time spent: 28 minutes  [employer]

## 2017-01-08 DEATH — deceased

## 2017-01-09 LAB — BASIC METABOLIC PANEL WITH GFR
Anion gap: 12 (ref 5–15)
BUN: 108 mg/dL — ABNORMAL HIGH (ref 6–20)
CO2: 25 mmol/L (ref 22–32)
Calcium: 9.1 mg/dL (ref 8.9–10.3)
Chloride: 102 mmol/L (ref 101–111)
Creatinine, Ser: 2.22 mg/dL — ABNORMAL HIGH (ref 0.44–1.00)
GFR calc Af Amer: 23 mL/min — ABNORMAL LOW
GFR calc non Af Amer: 20 mL/min — ABNORMAL LOW
Glucose, Bld: 221 mg/dL — ABNORMAL HIGH (ref 65–99)
Potassium: 4.6 mmol/L (ref 3.5–5.1)
Sodium: 139 mmol/L (ref 135–145)

## 2017-01-09 LAB — CBC
HCT: 29.4 % — ABNORMAL LOW (ref 35.0–47.0)
Hemoglobin: 9.5 g/dL — ABNORMAL LOW (ref 12.0–16.0)
MCH: 32 pg (ref 26.0–34.0)
MCHC: 32.2 g/dL (ref 32.0–36.0)
MCV: 99.4 fL (ref 80.0–100.0)
Platelets: 80 10*3/uL — ABNORMAL LOW (ref 150–440)
RBC: 2.96 MIL/uL — ABNORMAL LOW (ref 3.80–5.20)
RDW: 20.1 % — AB (ref 11.5–14.5)
WBC: 9 10*3/uL (ref 3.6–11.0)

## 2017-01-09 MED ORDER — HALOPERIDOL LACTATE 5 MG/ML IJ SOLN
2.0000 mg | Freq: Four times a day (QID) | INTRAMUSCULAR | Status: DC | PRN
Start: 2017-01-09 — End: 2017-01-11
  Administered 2017-01-10: 2 mg via INTRAVENOUS
  Filled 2017-01-09: qty 1

## 2017-01-09 MED ORDER — POLYETHYLENE GLYCOL 3350 17 G PO PACK
17.0000 g | PACK | Freq: Every day | ORAL | Status: DC
  Administered 2017-01-09 – 2017-01-10 (×2): 17 g via ORAL
  Filled 2017-01-09 (×2): qty 1

## 2017-01-09 MED ORDER — ALPRAZOLAM 0.5 MG PO TABS
0.5000 mg | ORAL_TABLET | Freq: Three times a day (TID) | ORAL | Status: DC | PRN
  Administered 2017-01-09 – 2017-01-10 (×2): 0.5 mg via ORAL
  Filled 2017-01-09 (×2): qty 1

## 2017-01-09 MED ORDER — HALOPERIDOL LACTATE 5 MG/ML IJ SOLN
1.0000 mg | Freq: Once | INTRAMUSCULAR | Status: AC
  Administered 2017-01-09: 1 mg via INTRAVENOUS
  Filled 2017-01-09: qty 1

## 2017-01-09 NOTE — Progress Notes (Signed)
Pt is restless, and c/o pain,  Gave PRN pain medication, and antianxiety medication with emotional support.

## 2017-01-09 NOTE — Progress Notes (Addendum)
Patient ID: Ebony Navarro, female   DOB: [birth year], 83 y.o.   MRN: [id number]   [facility] PROGRESS NOTE  Ebony Navarro HMC:[id number] DOB: Jul 03, 1933 DOA: 12/29/2016 PCP: Adin Hector, MD  HPI/Subjective: Pt had some urinary retention, she had a foley but in delirium last night - she pulled it out. Remains delirius.  Objective: Vitals:   01/09/17 0813 01/09/17 0813  BP: (!) 120/56   Pulse: 88 84  Resp: 18   Temp: (!) 97.4 F (36.3 C)   SpO2: 90% 94%    Filed Weights   01/07/17 0500 01/08/17 0634 01/09/17 0509  Weight: 74.3 kg (163 lb 12.8 oz) 73.8 kg (162 lb 12.8 oz) 76.1 kg (167 lb 12.8 oz)    ROS: Review of Systems  Unable to perform ROS: Mental acuity   Exam: Physical Exam  HENT:  Nose: No mucosal edema.  Mouth/Throat: No oropharyngeal exudate or posterior oropharyngeal edema.  Eyes: Conjunctivae, EOM and lids are normal. Pupils are equal, round, and reactive to light.  Neck: No JVD present. Carotid bruit is not present. No edema present. No thyroid mass and no thyromegaly present.  Cardiovascular: S1 normal and S2 normal. Exam reveals no gallop.  No murmur heard. Pulses:      Dorsalis pedis pulses are 2+ on the right side, and 2+ on the left side.  Respiratory: No respiratory distress. She has decreased breath sounds in the right lower field, the left middle field and the left lower field. She has wheezes in the right lower field and the left middle field. She has rhonchi in the left lower field. She has no rales.  GI: Soft. Bowel sounds are normal. There is no tenderness.  Musculoskeletal:       Right ankle: She exhibits swelling.       Left ankle: She exhibits swelling.  Lymphadenopathy:    She has no cervical adenopathy.  Neurological:  Pt is sleepy now.  Skin: Skin is warm. No rash noted. Nails show no clubbing.  Psychiatric:  Sleepy now,.      Data Reviewed: Basic Metabolic Panel: Recent Labs  Lab 12/27/2016 0636  01/05/17 0556 01/07/17 0437 01/09/17 0600  NA 141 142 140 139  K 4.2 3.9 4.2 4.6  CL 99* 104 102 102  CO2 28 26 24 25   GLUCOSE 131* 125* 233* 221*  BUN 70* 69* 85* 108*  CREATININE 1.61* 1.60* 1.85* 2.22*  CALCIUM 8.8* 8.3* 9.0 9.1   Liver Function Tests: Recent Labs  Lab 12/11/2016 0636 01/04/17 1516  AST 50* 42*  ALT 132* 94*  ALKPHOS 91 80  BILITOT 1.9* 1.1  PROT 6.9 6.4*  ALBUMIN 3.1* 2.7*   CBC: Recent Labs  Lab 12/14/2016 0636 01/05/17 0556 01/09/17 0600  WBC 5.2 6.1 9.0  NEUTROABS  --  4.6  --   HGB 10.1* 9.5* 9.5*  HCT 31.5* 30.6* 29.4*  MCV 94.6 97.1 99.4  PLT 103* 96* 80*   Cardiac Enzymes: Recent Labs  Lab 12/12/2016 0636 12/28/2016 1023 12/23/2016 1507 12/27/2016 2149  TROPONINI 0.34* 0.35* 0.28* 0.28*    CBG: No results for input(s): GLUCAP in the last 168 hours.  Recent Results (from the past 240 hour(s))  Blood culture (routine x 2)     Status: None   Collection Time: 12/29/2016  7:45 AM  Result Value Ref Range Status   Specimen Description BLOOD RIGHT ANTECUBITAL  Final   Special Requests   Final    BOTTLES DRAWN AEROBIC AND  ANAEROBIC Blood Culture results may not be optimal due to an excessive volume of blood received in culture bottles   Culture NO GROWTH 5 DAYS  Final   Report Status 01/08/2017 FINAL  Final  Blood culture (routine x 2)     Status: None   Collection Time: 12/15/2016  7:45 AM  Result Value Ref Range Status   Specimen Description BLOOD BLOOD LEFT ARM  Final   Special Requests   Final    BOTTLES DRAWN AEROBIC AND ANAEROBIC Blood Culture adequate volume   Culture NO GROWTH 5 DAYS  Final   Report Status 01/08/2017 FINAL  Final  Respiratory Panel by PCR     Status: None   Collection Time: 01/05/17  6:01 PM  Result Value Ref Range Status   Adenovirus NOT DETECTED NOT DETECTED Final   Coronavirus 229E NOT DETECTED NOT DETECTED Final   Coronavirus HKU1 NOT DETECTED NOT DETECTED Final   Coronavirus NL63 NOT DETECTED NOT DETECTED Final    Coronavirus OC43 NOT DETECTED NOT DETECTED Final   Metapneumovirus NOT DETECTED NOT DETECTED Final   Rhinovirus / Enterovirus NOT DETECTED NOT DETECTED Final   Influenza A NOT DETECTED NOT DETECTED Final   Influenza B NOT DETECTED NOT DETECTED Final   Parainfluenza Virus 1 NOT DETECTED NOT DETECTED Final   Parainfluenza Virus 2 NOT DETECTED NOT DETECTED Final   Parainfluenza Virus 3 NOT DETECTED NOT DETECTED Final   Parainfluenza Virus 4 NOT DETECTED NOT DETECTED Final   Respiratory Syncytial Virus NOT DETECTED NOT DETECTED Final   Bordetella pertussis NOT DETECTED NOT DETECTED Final   Chlamydophila pneumoniae NOT DETECTED NOT DETECTED Final   Mycoplasma pneumoniae NOT DETECTED NOT DETECTED Final    Comment: Performed at [facility], [place] [address], [place], [place] [address]     Scheduled Meds: . amiodarone  200 mg Oral BID  . aspirin EC  81 mg Oral Daily  . atorvastatin  40 mg Oral Daily  . budesonide (PULMICORT) nebulizer solution  0.5 mg Nebulization BID  . docusate sodium  100 mg Oral BID  . feeding supplement (ENSURE ENLIVE)  237 mL Oral BID BM  . furosemide  20 mg Oral Daily  . ipratropium-albuterol  3 mL Nebulization Q6H  . levothyroxine  50 mcg Oral QAC breakfast  . megestrol  20 mg Oral Daily  . Melatonin  5 mg Oral QHS  . midodrine  5 mg Oral TID WC  . multivitamin with minerals  1 tablet Oral Daily  . pantoprazole  40 mg Oral Q1200  . polyethylene glycol  17 g Oral Daily  . QUEtiapine  25 mg Oral QHS  . sodium chloride flush  10-40 mL Intracatheter Q12H   Continuous Infusions: . ceFEPime (MAXIPIME) IV Stopped (01/09/17 4970)    Assessment/Plan:  1. Acute hypoxic respiratory failure.  Continue oxygen supplementation.  2. Healthcare associated pneumonia.  On cefepime.  Procalcitonin elevated and  Continue antibiotics. Influenza panel negative and viral respiratory panel negative. 3. Bronchospasm and wheezing.  Continue nebulizer treatments DuoNeb and  budesonide. Stop solumedrol. 4. Acute delerium, Insomnia and hallucinations -haldol prn and nightly Seroquel, also had some urinary retention, still very agitated, increased xanax dose. 5. Stage III ovarian cancer 6. Recent non-STEMI troponins trending down.  On low-dose aspirin. 7. Recent GI bleed stopped Lovenox injections.  On Protonix 8. Atrial fibrillation on amiodarone 9. Hypothyroidism unspecified on levothyroxine 10. Hypotension on midodrine  I spoke to her daughter [name] on phone today, about her  diagnosis and likely poor prognosis. Encourage her to meet with palliative care tomorrow.  Code Status:     Code Status Orders  (From admission, onward)        Start     Ordered   12/12/2016 1034  Do not attempt resuscitation (DNR)  Continuous    Question Answer Comment  In the event of cardiac or respiratory ARREST Do not call a "code blue"   In the event of cardiac or respiratory ARREST Do not perform Intubation, CPR, defibrillation or ACLS   In the event of cardiac or respiratory ARREST Use medication by any route, position, wound care, and other measures to relive pain and suffering. May use oxygen, suction and manual treatment of airway obstruction as needed for comfort.   Comments rn may pronounce      12/21/2016 1035    Code Status History    Date Active Date Inactive Code Status Order ID Comments User Context   12/21/2016 18:54 01/01/2017 20:14 DNR [id number]  Shireen Quan, RN Inpatient   12/19/2016 05:26 12/21/2016 18:54 Full Code [id number]  Saundra Shelling, MD Inpatient   11/20/2016 16:39 11/22/2016 19:50 Full Code [id number]  Idelle Crouch, MD ED    Advance Directive Documentation     Most Recent Value  Type of Advance Directive  Healthcare Power of Attorney  Pre-existing out of facility DNR order (yellow form or pink MOST form)  No data  "MOST" Form in Place?  No data     Family Communication: Spoke with daughter on the phone and given update on  plan Disposition Plan: To be determined  Consultants:  Oncology  Palliative care  Antibiotics: Cefepime  Time spent: 28 minutes  [employer]

## 2017-01-09 NOTE — Progress Notes (Signed)
Patient pulled her Foley Catheter for the second time this shift with balloon inflated. No bleeding noted at this time. Dr. Marcille Blanco notified and he indicated to leave it off  at this time. No acute distress noted. Personal sitter at bedside. Will continue to monitor.

## 2017-01-09 NOTE — Progress Notes (Signed)
Patient was agitated and removed her foley catheter. No bleeding  or any acute distress noted. New 14 F. Foley inserted. Patient tolerated well. Will continue to monitor.

## 2017-01-10 DIAGNOSIS — G9340 Encephalopathy, unspecified: Secondary | ICD-10-CM

## 2017-01-10 DIAGNOSIS — C801 Malignant (primary) neoplasm, unspecified: Secondary | ICD-10-CM

## 2017-01-10 DIAGNOSIS — Z66 Do not resuscitate: Secondary | ICD-10-CM

## 2017-01-10 DIAGNOSIS — Z515 Encounter for palliative care: Secondary | ICD-10-CM

## 2017-01-10 MED ORDER — MORPHINE SULFATE (CONCENTRATE) 10 MG/0.5ML PO SOLN
5.0000 mg | ORAL | Status: DC | PRN
  Administered 2017-01-10 – 2017-01-11 (×4): 5 mg via ORAL
  Filled 2017-01-10 (×4): qty 1

## 2017-01-10 MED ORDER — SODIUM CHLORIDE 0.9 % IV SOLN
INTRAVENOUS | Status: DC
  Administered 2017-01-10 (×2): via INTRAVENOUS

## 2017-01-10 NOTE — Progress Notes (Signed)
Patient ID: Ebony Navarro, female   DOB: 1933-11-12, 83 y.o.   MRN: [id number]   [facility] PROGRESS NOTE  Ebony Navarro DQQ:[id number] DOB: 1933-07-09 DOA: 01/04/2017 PCP: Adin Hector, MD  HPI/Subjective: Pt had some urinary retention, she had a foley but in delirium  - she pulled it out. Remains delirius. Appears very dehydrated.  Objective: Vitals:   01/10/17 0735 01/10/17 1637  BP: (!) 106/52 111/76  Pulse: 82 92  Resp: 17 18  Temp: 97.7 F (36.5 C) 97.6 F (36.4 C)  SpO2: 99% 96%    Filed Weights   01/08/17 0634 01/09/17 0509 01/10/17 0437  Weight: 73.8 kg (162 lb 12.8 oz) 76.1 kg (167 lb 12.8 oz) 75.8 kg (167 lb 1.6 oz)    ROS: Review of Systems  Unable to perform ROS: Mental acuity   Exam: Physical Exam  HENT:  Nose: No mucosal edema.  Mouth/Throat: No oropharyngeal exudate or posterior oropharyngeal edema.  Mucosa dry  Eyes: Conjunctivae, EOM and lids are normal. Pupils are equal, round, and reactive to light.  Neck: No JVD present. Carotid bruit is not present. No edema present. No thyroid mass and no thyromegaly present.  Cardiovascular: S1 normal and S2 normal. Exam reveals no gallop.  No murmur heard. Pulses:      Dorsalis pedis pulses are 2+ on the right side, and 2+ on the left side.  Respiratory: No respiratory distress. She has decreased breath sounds in the right lower field, the left middle field and the left lower field. She has wheezes in the right lower field and the left middle field. She has rhonchi in the left lower field. She has no rales.  GI: Soft. Bowel sounds are normal. There is no tenderness.  Musculoskeletal:       Right ankle: She exhibits swelling.       Left ankle: She exhibits swelling.  Lymphadenopathy:    She has no cervical adenopathy.  Neurological:  Pt is sleepy now.  Skin: Skin is warm. No rash noted. Nails show no clubbing.  Psychiatric:  Sleepy now,.      Data Reviewed: Basic Metabolic  Panel: Recent Labs  Lab 01/05/17 0556 01/07/17 0437 01/09/17 0600  NA 142 140 139  K 3.9 4.2 4.6  CL 104 102 102  CO2 26 24 25   GLUCOSE 125* 233* 221*  BUN 69* 85* 108*  CREATININE 1.60* 1.85* 2.22*  CALCIUM 8.3* 9.0 9.1   Liver Function Tests: Recent Labs  Lab 01/04/17 1516  AST 42*  ALT 94*  ALKPHOS 80  BILITOT 1.1  PROT 6.4*  ALBUMIN 2.7*   CBC: Recent Labs  Lab 01/05/17 0556 01/09/17 0600  WBC 6.1 9.0  NEUTROABS 4.6  --   HGB 9.5* 9.5*  HCT 30.6* 29.4*  MCV 97.1 99.4  PLT 96* 80*   Cardiac Enzymes: Recent Labs  Lab 12/12/2016 2149  TROPONINI 0.28*    CBG: No results for input(s): GLUCAP in the last 168 hours.  Recent Results (from the past 240 hour(s))  Blood culture (routine x 2)     Status: None   Collection Time: 12/13/2016  7:45 AM  Result Value Ref Range Status   Specimen Description BLOOD RIGHT ANTECUBITAL  Final   Special Requests   Final    BOTTLES DRAWN AEROBIC AND ANAEROBIC Blood Culture results may not be optimal due to an excessive volume of blood received in culture bottles   Culture NO GROWTH 5 DAYS  Final  Report Status 01/08/2017 FINAL  Final  Blood culture (routine x 2)     Status: None   Collection Time: 12/13/2016  7:45 AM  Result Value Ref Range Status   Specimen Description BLOOD BLOOD LEFT ARM  Final   Special Requests   Final    BOTTLES DRAWN AEROBIC AND ANAEROBIC Blood Culture adequate volume   Culture NO GROWTH 5 DAYS  Final   Report Status 01/08/2017 FINAL  Final  Respiratory Panel by PCR     Status: None   Collection Time: 01/05/17  6:01 PM  Result Value Ref Range Status   Adenovirus NOT DETECTED NOT DETECTED Final   Coronavirus 229E NOT DETECTED NOT DETECTED Final   Coronavirus HKU1 NOT DETECTED NOT DETECTED Final   Coronavirus NL63 NOT DETECTED NOT DETECTED Final   Coronavirus OC43 NOT DETECTED NOT DETECTED Final   Metapneumovirus NOT DETECTED NOT DETECTED Final   Rhinovirus / Enterovirus NOT DETECTED NOT DETECTED  Final   Influenza A NOT DETECTED NOT DETECTED Final   Influenza B NOT DETECTED NOT DETECTED Final   Parainfluenza Virus 1 NOT DETECTED NOT DETECTED Final   Parainfluenza Virus 2 NOT DETECTED NOT DETECTED Final   Parainfluenza Virus 3 NOT DETECTED NOT DETECTED Final   Parainfluenza Virus 4 NOT DETECTED NOT DETECTED Final   Respiratory Syncytial Virus NOT DETECTED NOT DETECTED Final   Bordetella pertussis NOT DETECTED NOT DETECTED Final   Chlamydophila pneumoniae NOT DETECTED NOT DETECTED Final   Mycoplasma pneumoniae NOT DETECTED NOT DETECTED Final    Comment: Performed at [facility], [place] [address], [place], [place] [address]     Scheduled Meds: . amiodarone  200 mg Oral BID  . aspirin EC  81 mg Oral Daily  . atorvastatin  40 mg Oral Daily  . budesonide (PULMICORT) nebulizer solution  0.5 mg Nebulization BID  . docusate sodium  100 mg Oral BID  . feeding supplement (ENSURE ENLIVE)  237 mL Oral BID BM  . ipratropium-albuterol  3 mL Nebulization Q6H  . levothyroxine  50 mcg Oral QAC breakfast  . megestrol  20 mg Oral Daily  . Melatonin  5 mg Oral QHS  . midodrine  5 mg Oral TID WC  . multivitamin with minerals  1 tablet Oral Daily  . pantoprazole  40 mg Oral Q1200  . polyethylene glycol  17 g Oral Daily  . QUEtiapine  25 mg Oral QHS  . sodium chloride flush  10-40 mL Intracatheter Q12H   Continuous Infusions: . sodium chloride 75 mL/hr at 01/10/17 0903  . ceFEPime (MAXIPIME) IV Stopped (01/10/17 3976)    Assessment/Plan:  1. Acute hypoxic respiratory failure.  Continue oxygen supplementation.  2. Healthcare associated pneumonia.  On cefepime.  Procalcitonin elevated and  Continue antibiotics. Influenza panel negative and viral respiratory panel negative. 3. Bronchospasm and wheezing.  Continue nebulizer treatments DuoNeb and budesonide. Stop solumedrol. 4. Acute delerium, Insomnia and hallucinations -haldol prn and nightly Seroquel, also had some urinary retention,  still very agitated, increased xanax and haldol.      Also discussed with daughter yesterday about likely no recovery from this admission and encouraged to meet with palliative care. 5. Stage III ovarian cancer- prognosis very poor now. 6. Recent non-STEMI troponins trending down.  On low-dose aspirin. 7. Recent GI bleed stopped Lovenox injections.  On Protonix 8. Atrial fibrillation on amiodarone 9. Hypothyroidism unspecified on levothyroxine 10. Hypotension on midodrine 11. Ac renal failure- worsening- IV fluids, monitor. 12.  Urinary retention-  with UTI, on Abx      Will do in and out cath, if family agreed on comfort measures, may      also add foley.    Code Status:     Code Status Orders  (From admission, onward)        Start     Ordered   12/15/2016 1034  Do not attempt resuscitation (DNR)  Continuous    Question Answer Comment  In the event of cardiac or respiratory ARREST Do not call a "code blue"   In the event of cardiac or respiratory ARREST Do not perform Intubation, CPR, defibrillation or ACLS   In the event of cardiac or respiratory ARREST Use medication by any route, position, wound care, and other measures to relive pain and suffering. May use oxygen, suction and manual treatment of airway obstruction as needed for comfort.   Comments rn may pronounce      12/22/2016 1035    Code Status History    Date Active Date Inactive Code Status Order ID Comments User Context   12/21/2016 18:54 01/01/2017 20:14 DNR [id number]  Shireen Quan, RN Inpatient   12/19/2016 05:26 12/21/2016 18:54 Full Code [id number]  Saundra Shelling, MD Inpatient   11/20/2016 16:39 11/22/2016 19:50 Full Code [id number]  Idelle Crouch, MD ED    Advance Directive Documentation     Most Recent Value  Type of Advance Directive  Healthcare Power of Attorney  Pre-existing out of facility DNR order (yellow form or pink MOST form)  No data  "MOST" Form in Place?  No data     Family  Communication: palliative care meeting. Disposition Plan: To be determined  Consultants:  Oncology  Palliative care  Antibiotics: Cefepime  Time spent: 28 minutes  [employer]

## 2017-01-10 NOTE — Progress Notes (Signed)
Bladder scan showed volume of 481. I and O cath done with [facility]. 500 ml of tea colored urine removed. Pt tolerated well.

## 2017-01-10 NOTE — Consult Note (Signed)
Consultation Note Date: 01/10/2017   Patient Name: Ebony Navarro  DOB: 11-11-1933  MRN: [id number]  Age / Sex: 83 y.o., female  PCP: Adin Hector, MD Referring Physician: Vaughan Basta, *  Reason for Consultation: Establishing goals of care, Non pain symptom management, Pain control and Psychosocial/spiritual support  HPI/Patient Profile: 83 y.o. female  admitted on 12/25/2016 with a known past medical history of breast cancer, recent history of ovarian cancer, on chemotherapy, recent history of non-STEMI, pneumonia discharged to rehab center is presenting to the ED with a chief complaint of worsening of shortness of breath.    Chest x-ray has revealed multifocal pneumonia with some pleural effusion.    She was placed on empiric IV antibiotics and admitted for treatment and stabilization.   Patient has failed to respond to medical interventions and has continued to decline physically, functionally, and cognitive over the past 7 days during this hospitalization. Oncology is recommending hospice.  Family is faced with treatment option decisions, advanced directive decisions and anticipatory care needs.   Clinical Assessment and Goals of Care:  This NP Wadie Lessen reviewed medical records, received report from team, assessed the patient and then meet at the patient's bedside along with her daughter [name] and her son Alvester Chou to discuss diagnosis, prognosis, [place], EOL wishes disposition and options.  A detailed discussion was had today regarding advanced directives.  Concepts specific to code status, artifical feeding and hydration, continued IV antibiotics and rehospitalization was had.  The difference between a aggressive medical intervention path  and a palliative comfort care path for this patient at this time was had.  Values and goals of care important to patient and family were attempted to  be elicited.  MOST form introduced  Concept of Hospice and Palliative Care were discussed  Natural trajectory and expectations at EOL were discussed.  Questions and concerns addressed.     Patient's daughter is very tearful during conversation today.  She worries that if they make a shift to a more comfort approach that she Is in some way  giving up.  We discussed the concept of mortality and the limitations of medical interventions.    Emotional support offered. 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  Family encouraged to call with questions or concerns.  PMT will continue to support holistically.  This NP will follow-up with family in the morning for further clarification of goals of care     SUMMARY OF RECOMMENDATIONS    Code Status/Advance Care Planning:  DNR   Symptom Management:   Pain/Dyspnea: Roxanol 5 mg by mouth/sublingual every 3 hours when necessaryRoxanol 5 mg p.o./sublingual every 3 hours as needed  Place Foley catheter  to enhance comfortto enhance comfort  Palliative Prophylaxis:   Aspiration, Bowel Regimen, Delirium Protocol, Frequent Pain Assessment and Oral Care  Additional Recommendations (Limitations, Scope, Preferences): Full Scope Treatment-- family is beginning to shift to a more comfort approach.   Psycho-social/Spiritual:   Desire  for further Chaplaincy support:no  Additional Recommendations: Education on Hospice  Prognosis:   < 4 weeks, depends on desire for life prolonging measures  Discharge Planning: To Be Determined      Primary Diagnoses: Present on Admission: . Acute respiratory failure ([place])   I have reviewed the medical record, interviewed the patient and family, and examined the patient. The following aspects are pertinent.  Past Medical History:  Diagnosis Date  . Breast cancer ([place]) 2005   Right, radiation and lumpectomy  . Breast cancer ([place]) 2002     Left, Chemo, radiation and lumpectomy  . Cancer ([place])   . Hyperlipidemia   . Hypertension   . Ovarian cancer ([place]) 11/24/2016   Social History   Socioeconomic History  . Marital status: Widowed    Spouse name: None  . Number of children: None  . Years of education: None  . Highest education level: None  Social Needs  . Financial resource strain: None  . Food insecurity - worry: None  . Food insecurity - inability: None  . Transportation needs - medical: None  . Transportation needs - non-medical: None  Occupational History  . Occupation: retired  Tobacco Use  . Smoking status: Never Smoker  . Smokeless tobacco: Never Used  Substance and Sexual Activity  . Alcohol use: No  . Drug use: No  . Sexual activity: No    Birth control/protection: Post-menopausal  Other Topics Concern  . None  Social History Narrative  . None   Family History  Problem Relation Age of Onset  . Hypertension Mother   . Hypertension Father    Scheduled Meds: . amiodarone  200 mg Oral BID  . aspirin EC  81 mg Oral Daily  . atorvastatin  40 mg Oral Daily  . budesonide (PULMICORT) nebulizer solution  0.5 mg Nebulization BID  . docusate sodium  100 mg Oral BID  . feeding supplement (ENSURE ENLIVE)  237 mL Oral BID BM  . ipratropium-albuterol  3 mL Nebulization Q6H  . levothyroxine  50 mcg Oral QAC breakfast  . megestrol  20 mg Oral Daily  . Melatonin  5 mg Oral QHS  . midodrine  5 mg Oral TID WC  . multivitamin with minerals  1 tablet Oral Daily  . pantoprazole  40 mg Oral Q1200  . polyethylene glycol  17 g Oral Daily  . QUEtiapine  25 mg Oral QHS  . sodium chloride flush  10-40 mL Intracatheter Q12H   Continuous Infusions: . sodium chloride 75 mL/hr at 01/10/17 0903  . ceFEPime (MAXIPIME) IV Stopped (01/10/17 0347)   PRN Meds:.acetaminophen, ALPRAZolam, guaiFENesin-dextromethorphan, haloperidol lactate, nitroGLYCERIN, ondansetron, oxyCODONE-acetaminophen, sodium chloride  flush Medications Prior to Admission:  Prior to Admission medications   Medication Sig Start Date End Date Taking? Authorizing Provider  amiodarone (PACERONE) 200 MG tablet Take 1 tablet (200 mg total) by mouth 2 (two) times daily. 12/31/16  Yes Fritzi Mandes, MD  aspirin EC 81 MG EC tablet Take 1 tablet (81 mg total) by mouth daily. 12/31/16  Yes Fritzi Mandes, MD  atorvastatin (LIPITOR) 40 MG tablet Take 40 mg by mouth daily.    Yes [provider]  dexamethasone (DECADRON) 4 MG tablet Take 8 mg daily by mouth. Start the day after chemotherapy for 2 days   Yes [provider]  feeding supplement, ENSURE ENLIVE, (ENSURE ENLIVE) LIQD Take 237 mLs by mouth 2 (two) times daily between meals. 12/31/16  Yes Fritzi Mandes, MD  furosemide (LASIX) 20 MG tablet  Take 1 tablet (20 mg total) by mouth daily. 12/31/16  Yes Fritzi Mandes, MD  levothyroxine (SYNTHROID, LEVOTHROID) 50 MCG tablet TAKE 1 TABLET BY MOUTH DAILY ON AN EMPTY STOMACH WITH A GLASS OF WATER 30-60 MIN BEFORE BREAKFAST 11/11/14  Yes [provider]  megestrol (MEGACE) 20 MG tablet Take 1 tablet (20 mg total) by mouth daily. 11/30/16  Yes Burns, Wandra Feinstein, NP  Melatonin 5 MG TABS Take 1 tablet (5 mg total) by mouth at bedtime. 12/31/16  Yes Fritzi Mandes, MD  midodrine (PROAMATINE) 5 MG tablet Take 1 tablet (5 mg total) by mouth 3 (three) times daily with meals. 12/31/16  Yes Fritzi Mandes, MD  Multiple Vitamin (MULTIVITAMIN WITH MINERALS) TABS tablet Take 1 tablet by mouth daily. 12/31/16  Yes Fritzi Mandes, MD  nitroGLYCERIN (NITROSTAT) 0.4 MG SL tablet Place 1 tablet (0.4 mg total) under the tongue every 5 (five) minutes x 3 doses as needed for chest pain. 12/31/16  Yes Fritzi Mandes, MD  pantoprazole (PROTONIX) 40 MG tablet Take 1 tablet (40 mg total) by mouth daily at 12 noon. 12/31/16  Yes Fritzi Mandes, MD  ALPRAZolam Duanne Moron) 0.25 MG tablet Take 1 tablet (0.25 mg total) by mouth at bedtime as needed for anxiety. 12/31/16    Fritzi Mandes, MD  guaiFENesin-dextromethorphan [facility] DM) 100-10 MG/5ML syrup Take 5 mLs by mouth every 4 (four) hours as needed for cough. 12/31/16   Fritzi Mandes, MD  ipratropium-albuterol (DUONEB) 0.5-2.5 (3) MG/3ML SOLN Take 3 mLs by nebulization every 4 (four) hours as needed. 12/31/16   Fritzi Mandes, MD  ondansetron (ZOFRAN) 8 MG tablet Take 1 tablet (8 mg total) by mouth 2 (two) times daily as needed for refractory nausea / vomiting. Start on day 3 after chemo. 11/26/16   Earlie Server, MD  promethazine (PHENERGAN) 25 MG tablet Take 1 tablet (25 mg total) by mouth every 8 (eight) hours as needed for nausea or vomiting. 12/31/16   Fritzi Mandes, MD   Allergies  Allergen Reactions  . Ace Inhibitors Other (See Comments)    hyperkalemia  . Atenolol Other (See Comments)    Worsening palpitations  . Iodine Other (See Comments)    11/20/16: per conversation with pt, pt with allergy to topical iodine and betadine.  Pt states she has had IV contrast in the past with now issues.  . Metoprolol Tartrate Other (See Comments)    intolerant  . Omeprazole Diarrhea  . Povidone-Iodine Other (See Comments)  . Rofecoxib Nausea Only  . Ciprofloxacin Rash  . Doxycycline Calcium Rash  . Nitrofurantoin Rash  . Quinolones Rash  . Sulfa Antibiotics Rash   Review of Systems  Unable to perform ROS: Acuity of condition    Physical Exam  Constitutional: She appears lethargic. She appears cachectic. She appears ill. Nasal cannula in place.  Cardiovascular: Normal rate, regular rhythm and normal heart sounds.  Pulmonary/Chest: Tachypnea noted. She has decreased breath sounds.  Neurological: She appears lethargic.    Vital Signs: BP (!) 106/52 (BP Location: Right Arm)   Pulse 82   Temp 97.7 F (36.5 C) (Oral)   Resp 17   Ht 5\' 6"  (1.676 m)   Wt 75.8 kg (167 lb 1.6 oz)   SpO2 99%   BMI 26.97 kg/m  Pain Assessment: No/denies pain   Pain Score: 0-No pain   SpO2: SpO2: 99 % O2 Device:SpO2: 99 % O2  Flow Rate: .O2 Flow Rate (L/min): 3 L/min  IO: Intake/output summary:   Intake/Output Summary (Last  24 hours) at 01/10/2017 1123 Last data filed at 01/10/2017 0941 Gross per 24 hour  Intake 10 ml  Output 1200 ml  Net -1190 ml    LBM: Last BM Date: 01/04/17 Baseline Weight: Weight: 74 kg (163 lb 2.3 oz) Most recent weight: Weight: 75.8 kg (167 lb 1.6 oz)     Palliative Assessment/Data: 30 % at best   Flowsheet Rows     Most Recent Value  Intake Tab  Referral Department  Hospitalist  Unit at Time of Referral  Med/Surg Unit  Palliative Care Primary Diagnosis  Cancer  Date Notified  01/05/17  Palliative Care Type  Not seen  Reason Not Seen  Patient/Family declined  Reason for referral  Clarify Goals of Care  Date of Admission  12/22/2016  # of days IP prior to Palliative referral  2  Clinical Assessment  Psychosocial & Spiritual Assessment  Palliative Care Outcomes     Discussed with Dr Anselm Jungling   Time In: 1600 Time Out: 1730 Time Total: 90 minutes Greater than 50%  of this time was spent counseling and coordinating care related to the above assessment and plan.  Signed by: Wadie Lessen, NP   Please contact Palliative Medicine Team phone at [phone] for questions and concerns.  For individual provider: See Shea Evans

## 2017-01-10 NOTE — Plan of Care (Signed)
  Progressing Clinical Measurements: Ability to maintain a body temperature in the normal range will improve 01/10/2017 0848 - Progressing by Etheleen Nicks, RN Note Remains afebrile   Progressing Clinical Measurements: Ability to maintain a body temperature in the normal range will improve 01/10/2017 0848 - Progressing by Etheleen Nicks, RN Note Remains afebrile   Not Progressing Clinical Measurements: Will remain free from infection 01/10/2017 0848 - Not Progressing by Etheleen Nicks, RN Note Remains on IV antibiotics Diagnostic test results will improve 01/10/2017 0848 - Not Progressing by Etheleen Nicks, RN Note BUN 108 Crea 2.22 today

## 2017-01-10 NOTE — Progress Notes (Signed)
PT Cancellation Note  Patient Details Name: Ebony Navarro MRN: [id number] DOB: May 17, 1933   Cancelled Treatment:    Reason Eval/Treat Not Completed: Fatigue/lethargy limiting ability to participate; Spoke to nursing prior to entering pt's room with nursing requesting no PT services this date secondary to pt lethargy.  Will attempt to see pt at a future date/time as medically appropriate.       Linus Salmons PT, DPT 01/10/17, 11:20 AM

## 2017-01-10 NOTE — Progress Notes (Signed)
Resting comfortably, no distress.  Sitter at bedside

## 2017-01-10 NOTE — Progress Notes (Signed)
Pt restless, moaning, Xanax 0.5mg  po given, will monitor.

## 2017-01-10 NOTE — Progress Notes (Signed)
Pt moaning, restless, Haldol 2mg  IV given, will continue to monitor.  Sitter at bedside.

## 2017-01-11 DIAGNOSIS — R0602 Shortness of breath: Secondary | ICD-10-CM

## 2017-01-11 DIAGNOSIS — J9601 Acute respiratory failure with hypoxia: Secondary | ICD-10-CM

## 2017-01-11 LAB — BASIC METABOLIC PANEL
Anion gap: 12 (ref 5–15)
BUN: 104 mg/dL — ABNORMAL HIGH (ref 6–20)
CHLORIDE: 109 mmol/L (ref 101–111)
CO2: 25 mmol/L (ref 22–32)
CREATININE: 1.81 mg/dL — AB (ref 0.44–1.00)
Calcium: 8.9 mg/dL (ref 8.9–10.3)
GFR calc non Af Amer: 25 mL/min — ABNORMAL LOW (ref >60)
GFR, EST AFRICAN AMERICAN: 29 mL/min — AB (ref >60)
GLUCOSE: 159 mg/dL — AB (ref 65–99)
Potassium: 4.8 mmol/L (ref 3.5–5.1)
Sodium: 146 mmol/L — ABNORMAL HIGH (ref 135–145)

## 2017-01-12 ENCOUNTER — Other Ambulatory Visit: Payer: Self-pay | Admitting: Nurse Practitioner

## 2017-01-12 ENCOUNTER — Ambulatory Visit: Payer: Medicare Other | Admitting: Family

## 2017-01-14 ENCOUNTER — Inpatient Hospital Stay: Payer: Medicare Other

## 2017-01-14 ENCOUNTER — Inpatient Hospital Stay: Payer: Medicare Other | Admitting: Oncology

## 2017-02-08 NOTE — Discharge Summary (Signed)
Date of death- 01-18-2017  Cause of death- ovarian cancer                             Healthcare associated pneumonia.  Contributing factore       Recent NSTEMI       A fib      Ac renal failure  Hospital course  1.    Acute hypoxic respiratory failure.  Continue oxygen supplementation.  2. Healthcare associated pneumonia.  On cefepime.  Procalcitonin elevated and  Continue antibiotics. Influenza panel negative and viral respiratory panel negative.      Today after meeting with palliative, family agreed to have comfort care. 3. Bronchospasm and wheezing.  Continue nebulizer treatments DuoNeb and budesonide. Stop solumedrol. 4. Acute delerium, Insomnia and hallucinations -haldol prn and nightly Seroquel, also had some urinary retention, still very agitated, increased xanax and haldol.      Also discussed with daughter yesterday about likely no recovery from this admission and encouraged to meet with palliative care. Lethargic and terminal appearing today. Comfort care. 5. Stage III ovarian cancer- prognosis very poor now. 6. Recent non-STEMI troponins trending down.  On low-dose aspirin. 7. Recent GI bleed stopped Lovenox injections.  On Protonix 8. Atrial fibrillation on amiodarone 9. Hypothyroidism unspecified on levothyroxine 10. Hypotension on midodrine 11. Ac renal failure- worsening- IV fluids, monitor. 12.  Urinary retention- with UTI, on Abx      Comfort care  Pt died in hospital in comfort care.

## 2017-02-08 NOTE — Care Management (Signed)
Palliative is working with family in regards to goals of treatment.  Oncology has recommended hospice.  Daughter is having difficulty with decision to pursue  comfort plan of care.

## 2017-02-08 NOTE — Progress Notes (Signed)
PT Cancellation Note  Patient Details Name: Ebony Navarro MRN: [id number] DOB: 11/19/1933   Cancelled Treatment:    Reason Eval/Treat Not Completed: Fatigue/lethargy limiting ability to participate   Discussed with nurse prior.  Observed pt and she remains lethargic.  Private sitter in room stating she had been agitated and was finally sleeping.  Pt undisturbed at this time.   Chesley Noon 02-07-17, 10:34 AM

## 2017-02-08 NOTE — Progress Notes (Signed)
Patient ID: Ebony Navarro, female   DOB: [birth year], 84 y.o.   MRN: [id number]   [facility] PROGRESS NOTE  ANALIZA COWGER DPO:[id number] DOB: [birth year] DOA: 12/20/2016 PCP: Adin Hector, MD  HPI/Subjective: Pt had some urinary retention, she had a foley but in delirium  - she pulled it out. Remains delirius. Appears very dehydrated, now after meds sleepy today, not arousable. Open mouth, shallow breath. Appears terminal..  Objective: Vitals:   2017/01/16 0306 01-16-17 0825  BP: 105/65 (!) 95/45  Pulse: 94 (!) 101  Resp: 18 18  Temp: 98.4 F (36.9 C) 99 F (37.2 C)  SpO2: 99% 96%    Filed Weights   01/16/2017 0306 Jan 16, 2017 1600 16-Jan-2017 1622  Weight: 73.6 kg (162 lb 3.2 oz) 73.6 kg (162 lb 3.2 oz) 73.6 kg (162 lb 3.2 oz)    ROS: Review of Systems  Unable to perform ROS: Mental acuity   Exam: Physical Exam  HENT:  Nose: No mucosal edema.  Mouth/Throat: No oropharyngeal exudate or posterior oropharyngeal edema.  Mucosa dry  Eyes: Conjunctivae, EOM and lids are normal. Pupils are equal, round, and reactive to light.  Neck: No JVD present. Carotid bruit is not present. No edema present. No thyroid mass and no thyromegaly present.  Cardiovascular: S1 normal and S2 normal. Exam reveals no gallop.  No murmur heard. Pulses:      Dorsalis pedis pulses are 2+ on the right side, and 2+ on the left side.  Respiratory: No respiratory distress. She has decreased breath sounds in the right lower field, the left middle field and the left lower field. She has wheezes in the right lower field and the left middle field. She has rhonchi in the left lower field. She has no rales.  GI: Soft. Bowel sounds are normal. There is no tenderness.  Musculoskeletal:       Right ankle: She exhibits swelling.       Left ankle: She exhibits swelling.  Lymphadenopathy:    She has no cervical adenopathy.  Neurological:  Pt is sleepy now.  Skin: Skin is warm. No rash noted. Nails show  no clubbing.  Psychiatric:  Sleepy now,.      Data Reviewed: Basic Metabolic Panel: Recent Labs  Lab 01/05/17 0556 01/07/17 0437 01/09/17 0600 01/16/2017 0443  NA 142 140 139 146*  K 3.9 4.2 4.6 4.8  CL 104 102 102 109  CO2 26 24 25 25   GLUCOSE 125* 233* 221* 159*  BUN 69* 85* 108* 104*  CREATININE 1.60* 1.85* 2.22* 1.81*  CALCIUM 8.3* 9.0 9.1 8.9   Liver Function Tests: No results for input(s): AST, ALT, ALKPHOS, BILITOT, PROT, ALBUMIN in the last 168 hours. CBC: Recent Labs  Lab 01/05/17 0556 01/09/17 0600  WBC 6.1 9.0  NEUTROABS 4.6  --   HGB 9.5* 9.5*  HCT 30.6* 29.4*  MCV 97.1 99.4  PLT 96* 80*   Cardiac Enzymes: No results for input(s): CKTOTAL, CKMB, CKMBINDEX, TROPONINI in the last 168 hours.  CBG: No results for input(s): GLUCAP in the last 168 hours.  Recent Results (from the past 240 hour(s))  Blood culture (routine x 2)     Status: None   Collection Time: 12/18/2016  7:45 AM  Result Value Ref Range Status   Specimen Description BLOOD RIGHT ANTECUBITAL  Final   Special Requests   Final    BOTTLES DRAWN AEROBIC AND ANAEROBIC Blood Culture results may not be optimal due to an excessive volume of blood  received in culture bottles   Culture NO GROWTH 5 DAYS  Final   Report Status 01/08/2017 FINAL  Final  Blood culture (routine x 2)     Status: None   Collection Time: 12/25/2016  7:45 AM  Result Value Ref Range Status   Specimen Description BLOOD BLOOD LEFT ARM  Final   Special Requests   Final    BOTTLES DRAWN AEROBIC AND ANAEROBIC Blood Culture adequate volume   Culture NO GROWTH 5 DAYS  Final   Report Status 01/08/2017 FINAL  Final  Respiratory Panel by PCR     Status: None   Collection Time: 01/05/17  6:01 PM  Result Value Ref Range Status   Adenovirus NOT DETECTED NOT DETECTED Final   Coronavirus 229E NOT DETECTED NOT DETECTED Final   Coronavirus HKU1 NOT DETECTED NOT DETECTED Final   Coronavirus NL63 NOT DETECTED NOT DETECTED Final    Coronavirus OC43 NOT DETECTED NOT DETECTED Final   Metapneumovirus NOT DETECTED NOT DETECTED Final   Rhinovirus / Enterovirus NOT DETECTED NOT DETECTED Final   Influenza A NOT DETECTED NOT DETECTED Final   Influenza B NOT DETECTED NOT DETECTED Final   Parainfluenza Virus 1 NOT DETECTED NOT DETECTED Final   Parainfluenza Virus 2 NOT DETECTED NOT DETECTED Final   Parainfluenza Virus 3 NOT DETECTED NOT DETECTED Final   Parainfluenza Virus 4 NOT DETECTED NOT DETECTED Final   Respiratory Syncytial Virus NOT DETECTED NOT DETECTED Final   Bordetella pertussis NOT DETECTED NOT DETECTED Final   Chlamydophila pneumoniae NOT DETECTED NOT DETECTED Final   Mycoplasma pneumoniae NOT DETECTED NOT DETECTED Final    Comment: Performed at [facility], [place] [address], [place], [place] [address]     Scheduled Meds: . budesonide (PULMICORT) nebulizer solution  0.5 mg Nebulization BID  . feeding supplement (ENSURE ENLIVE)  237 mL Oral BID BM  . ipratropium-albuterol  3 mL Nebulization Q6H  . sodium chloride flush  10-40 mL Intracatheter Q12H   Continuous Infusions: . sodium chloride 10 mL/hr at 2017/01/17 1243    Assessment/Plan:  1. Acute hypoxic respiratory failure.  Continue oxygen supplementation.  2. Healthcare associated pneumonia.  On cefepime.  Procalcitonin elevated and  Continue antibiotics. Influenza panel negative and viral respiratory panel negative.      Today after meeting with palliative, family agreed to have comfort care. 3. Bronchospasm and wheezing.  Continue nebulizer treatments DuoNeb and budesonide. Stop solumedrol. 4. Acute delerium, Insomnia and hallucinations -haldol prn and nightly Seroquel, also had some urinary retention, still very agitated, increased xanax and haldol.      Also discussed with daughter yesterday about likely no recovery from this admission and encouraged to meet with palliative care. Lethargic and terminal appearing today. Comfort care. 5. Stage III  ovarian cancer- prognosis very poor now. 6. Recent non-STEMI troponins trending down.  On low-dose aspirin. 7. Recent GI bleed stopped Lovenox injections.  On Protonix 8. Atrial fibrillation on amiodarone 9. Hypothyroidism unspecified on levothyroxine 10. Hypotension on midodrine 11. Ac renal failure- worsening- IV fluids, monitor. 12.  Urinary retention- with UTI, on Abx      Comfort care.    Code Status:     Code Status Orders  (From admission, onward)        Start     Ordered   12/24/2016 1034  Do not attempt resuscitation (DNR)  Continuous    Question Answer Comment  In the event of cardiac or respiratory ARREST Do not call a "code blue"  In the event of cardiac or respiratory ARREST Do not perform Intubation, CPR, defibrillation or ACLS   In the event of cardiac or respiratory ARREST Use medication by any route, position, wound care, and other measures to relive pain and suffering. May use oxygen, suction and manual treatment of airway obstruction as needed for comfort.   Comments rn may pronounce      01/06/2017 1035    Code Status History    Date Active Date Inactive Code Status Order ID Comments User Context   12/21/2016 18:54 01/01/2017 20:14 DNR [id number]  Shireen Quan, RN Inpatient   12/19/2016 05:26 12/21/2016 18:54 Full Code [id number]  Saundra Shelling, MD Inpatient   11/20/2016 16:39 11/22/2016 19:50 Full Code [id number]  Idelle Crouch, MD ED    Advance Directive Documentation     Most Recent Value  Type of Advance Directive  Healthcare Power of Attorney  Pre-existing out of facility DNR order (yellow form or pink MOST form)  No data  "MOST" Form in Place?  No data     Family Communication: palliative care meeting. Disposition Plan: To be determined  Consultants:  Oncology  Palliative care  Antibiotics: Cefepime  Time spent: 28 minutes  [employer]

## 2017-02-08 NOTE — Progress Notes (Addendum)
Patient ID: Ebony Navarro, female   DOB: August 23, 1933, 84 y.o.   MRN: [id number]  This NP visited patient at the bedside as a follow up to  yesterday's [place].  Sitter at bedside.    Patient's decline is evident, she is minimally responsive however  she looks more comfortable than I have seen her in days.  Placed call to daughter [name].   Continued conversation regarding diagnosis, prognosis, and goals of care. I shared with [name] the evident decline seen in Ebony Navarro this morning, I shared that I believe she is transitioning at end of life and that anything could happen at any time.  Again I detailed full comfort path.  Patient is unable to take oral medications at this time so all p.o. medications will be discontinued.  We will minimize IV fluids to [facility], continue oxygen via nasal cannula and utilize prn medications to enhance comfort, quality, and dignity.  Focus of care is comfort.  Prognosis is hours to days.  Reevaluate in the morning and if it makes sense access hospice facility.  Questions and concerns addressed  Discussed with Dr Anselm Jungling  Time in   0845        Time out  0920 Total time spent on the unit was 35 minutes    Greater than 50% of the time was spent in counseling and coordination of care  Wadie Lessen NP  Palliative Medicine Team Team Phone # [phone] Pager [phone]

## 2017-02-08 NOTE — Progress Notes (Addendum)
Hematology/Oncology Progress Note [facility] Telephone:([phone] [phone]  Patient Care Team: Adin Hector, MD as PCP - General (Internal Medicine) Clent Jacks, RN as Registered Nurse   Name of the patient: Ebony Navarro  [id number]  03-11-1933  Date of visit: 27-Jan-2017  INTERVAL HISTORY-  Patient appears lethargic, minimally responsive. Her caregiver at the bedside, her mental status has further declined since yesterday. Family has met palliative care focal of care discussion.   Family members were not at bedside currently.   Review of systems- ROS Cannot obtain ROS as patient is larthargic   Allergies  Allergen Reactions  . Ace Inhibitors Other (See Comments)    hyperkalemia  . Atenolol Other (See Comments)    Worsening palpitations  . Iodine Other (See Comments)    11/20/16: per conversation with pt, pt with allergy to topical iodine and betadine.  Pt states she has had IV contrast in the past with now issues.  . Metoprolol Tartrate Other (See Comments)    intolerant  . Omeprazole Diarrhea  . Povidone-Iodine Other (See Comments)  . Rofecoxib Nausea Only  . Ciprofloxacin Rash  . Doxycycline Calcium Rash  . Nitrofurantoin Rash  . Quinolones Rash  . Sulfa Antibiotics Rash    Patient Active Problem List   Diagnosis Date Noted  . Palliative care by specialist   . DNR (do not resuscitate)   . Encephalopathy   . Cancer ([place])   . Pressure injury of skin 01/07/2017  . Renal insufficiency   . Goals of care, counseling/discussion   . Acute respiratory failure ([place]) 12/13/2016  . Multifocal pneumonia   . Neutropenia, drug-induced ([place])   . Thrombocytopenia ([place])   . Acute respiratory failure with hypoxemia ([place])   . Shortness of breath   . NSTEMI (non-ST elevated myocardial infarction) ([place]) 12/19/2016  . Ovarian cancer ([place]) 11/24/2016  . Malignant neoplasm of ovary ([place]) 11/24/2016  . Abdominal pain 11/20/2016  .  Ascites 11/20/2016  . Ovarian mass 11/20/2016  . Absolute anemia 01/22/2015  . Anxiety 01/22/2015  . Malignant neoplasm of breast ([place]) 01/22/2015  . Clinical depression 01/22/2015  . Gastric catarrh 01/22/2015  . Blood glucose elevated 01/22/2015  . HLD (hyperlipidemia) 01/22/2015  . BP (high blood pressure) 01/22/2015  . Acquired atrophy of thyroid 04/02/2014  . Frequent UTI 11/23/2013  . Chronic kidney disease (CKD), stage III (moderate) ([place]) 10/02/2013     Past Medical History:  Diagnosis Date  . Breast cancer ([place]) 2005   Right, radiation and lumpectomy  . Breast cancer ([place]) 2002   Left, Chemo, radiation and lumpectomy  . Cancer ([place])   . Hyperlipidemia   . Hypertension   . Ovarian cancer ([place]) 11/24/2016     Past Surgical History:  Procedure Laterality Date  . BREAST BIOPSY Left 2010   negative stereotactic biopsy  . BREAST LUMPECTOMY Right 2005   positive  . BREAST LUMPECTOMY Left 2002   positive  . PORTA CATH INSERTION N/A 12/09/2016   Procedure: PORTA CATH INSERTION;  Surgeon: Algernon Huxley, MD;  Location: [facility];  Service: Cardiovascular;  Laterality: N/A;    Social History   Socioeconomic History  . Marital status: Widowed    Spouse name: Not on file  . Number of children: Not on file  . Years of education: Not on file  . Highest education level: Not on file  Social Needs  . Financial resource strain: Not on file  . Food insecurity -  worry: Not on file  . Food insecurity - inability: Not on file  . Transportation needs - medical: Not on file  . Transportation needs - non-medical: Not on file  Occupational History  . Occupation: retired  Tobacco Use  . Smoking status: Never Smoker  . Smokeless tobacco: Never Used  Substance and Sexual Activity  . Alcohol use: No  . Drug use: No  . Sexual activity: No    Birth control/protection: Post-menopausal  Other Topics Concern  . Not on file  Social History Narrative  . Not on file      Family History  Problem Relation Age of Onset  . Hypertension Mother   . Hypertension Father      Current Facility-Administered Medications:  .  0.9 %  sodium chloride infusion, , Intravenous, Continuous, Knox Royalty, NP, Last Rate: 10 mL/hr at 2017/02/09 1243 .  acetaminophen (TYLENOL) tablet 650 mg, 650 mg, Oral, Q6H PRN, Wieting, Richard, MD .  ALPRAZolam Duanne Moron) tablet 0.5 mg, 0.5 mg, Oral, TID PRN, Vaughan Basta, MD, 0.5 mg at 01/10/17 0905 .  budesonide (PULMICORT) nebulizer solution 0.5 mg, 0.5 mg, Nebulization, BID, Leslye Peer, Richard, MD, 0.5 mg at 02/09/17 0733 .  feeding supplement (ENSURE ENLIVE) (ENSURE ENLIVE) liquid 237 mL, 237 mL, Oral, BID BM, Gouru, Aruna, MD, 237 mL at 01/09/17 0818 .  haloperidol lactate (HALDOL) injection 2 mg, 2 mg, Intravenous, Q6H PRN, Vaughan Basta, MD, 2 mg at 01/10/17 1059 .  ipratropium-albuterol (DUONEB) 0.5-2.5 (3) MG/3ML nebulizer solution 3 mL, 3 mL, Nebulization, Q6H, Wieting, Richard, MD, 3 mL at 02-09-2017 1309 .  morphine CONCENTRATE 10 MG/0.5ML oral solution 5 mg, 5 mg, Oral, Q2H PRN, Knox Royalty, NP, 5 mg at 02-09-2017 0208 .  sodium chloride flush (NS) 0.9 % injection 10-40 mL, 10-40 mL, Intracatheter, Q12H, Wieting, Richard, MD, 10 mL at 01/10/17 0904 .  sodium chloride flush (NS) 0.9 % injection 10-40 mL, 10-40 mL, Intracatheter, PRN, Loletha Grayer, MD, 10 mL at 09-Feb-2017 0458   Physical exam:  Vitals:   01/10/17 1956 01/10/17 2036 02/09/2017 0306 2017/02/09 0825  BP:  (!) 125/54 105/65 (!) 95/45  Pulse: 88 98 94 (!) 101  Resp: 16 (!) 22 18 18   Temp:  97.9 F (36.6 C) 98.4 F (36.9 C) 99 F (37.2 C)  TempSrc:  Oral Oral Oral  SpO2: 94% 96% 99% 96%  Weight:   162 lb 3.2 oz (73.6 kg)   Height:       Physical Exam  Constitutional: No distress.  Lethargic and minimally responsive  HENT:  Head: Normocephalic and atraumatic.  Cardiovascular: Normal rate.  Pulmonary/Chest:  cheyne stokes breathing    Neurological:  Lethargic, minimally responsive     CMP Latest Ref Rng & Units 2017-02-09  Glucose 65 - 99 mg/dL 159(H)  BUN 6 - 20 mg/dL 104(H)  Creatinine 0.44 - 1.00 mg/dL 1.81(H)  Sodium 135 - 145 mmol/L 146(H)  Potassium 3.5 - 5.1 mmol/L 4.8  Chloride 101 - 111 mmol/L 109  CO2 22 - 32 mmol/L 25  Calcium 8.9 - 10.3 mg/dL 8.9  Total Protein 6.5 - 8.1 g/dL -  Total Bilirubin 0.3 - 1.2 mg/dL -  Alkaline Phos 38 - 126 U/L -  AST 15 - 41 U/L -  ALT 14 - 54 U/L -   CBC Latest Ref Rng & Units 01/09/2017  WBC 3.6 - 11.0 K/uL 9.0  Hemoglobin 12.0 - 16.0 g/dL 9.5(L)  Hematocrit 35.0 - 47.0 % 29.4(L)  Platelets  150 - 440 K/uL 80(L)      Assessment and plan- Patient is a27 y.o.femalewith remote history of breast cancer, recently diagnosed with stage III ovarian cancer being treated with curative intent status, post 2 cycles of chemotherapy, recently NSTEMI/CHF, GI bleeding presents with worsening SOB. CT showed multifocal consolidation.   # acute respiratory failure,  multifocal pneumonia on cefepime. .  Blood culture negative so far. On nasal cannula oxygen, oxygen saturation stable.   #Stage III ovarian cancer: no plan for chemotherapy given patient rapidly declined performance status.[place] Appropriate for hospice. Family member has met palliative care service. Patient is DNR/DNI.  # Delirium: haldol PRN and Seroquel  # NSTEMI/CHF/hypotension: on midodrine for BP support # Renal insufficiency: Creatinine stable.  Called daughter [name]. She tells me that patient is hospice now. Emotional support provided.  Prognosis is extremely poor. She is appropriate for hospice.  # CODE STATUS:DNR/DNI.    Earlie Server, MD, PhD [facility] Pager- [phone] February 07, 2017

## 2017-02-08 NOTE — Progress Notes (Signed)
Notified by private care sitter that patient that patient was not right. Upon arrival to room patient was not breathing and had no pulse. Second RN in room to verify. Time of death noted at 1610. Son arrived to room a couple minutes later, chaplain paged for support, emotional support given to son and sitter. MD notified.

## 2017-02-08 NOTE — Plan of Care (Signed)
Patient becomes agitated and is very confused. Patient has removed foley while private sitter was in the room. Replaced foley and applied mitts. Administered roxanol times 2 for agitation and air hunger. Will continue to monitor and assess.

## 2017-02-08 NOTE — Progress Notes (Signed)
CH responded to a PG for room 245. Pt has passed. Son is bedside. Son is upset but in a good space. CH assisted in gathering information for funeral home release. [place] provided a calming presence, prayer, and space to grieve.    January 30, 2017 1600  Clinical Encounter Type  Visited With Family  Visit Type Initial;Spiritual support;Death  Referral From Nurse  Spiritual Encounters  Spiritual Needs Prayer;Emotional

## 2017-02-08 DEATH — deceased

## 2018-09-24 IMAGING — CR DG CHEST 2V
1 series · 2 of 2 positions shown · non-contrast
Comparison: CT chest November 20, 2016

CLINICAL DATA: Central chest pain and shortness of breath
yesterday. History of ovarian cancer, breast cancer, on
chemotherapy.

EXAM:
CHEST  2 VIEW

[Series 1: dg chest 2 view · 0.14mm/px · 2 of 2 slices shown]
[im 1/2]
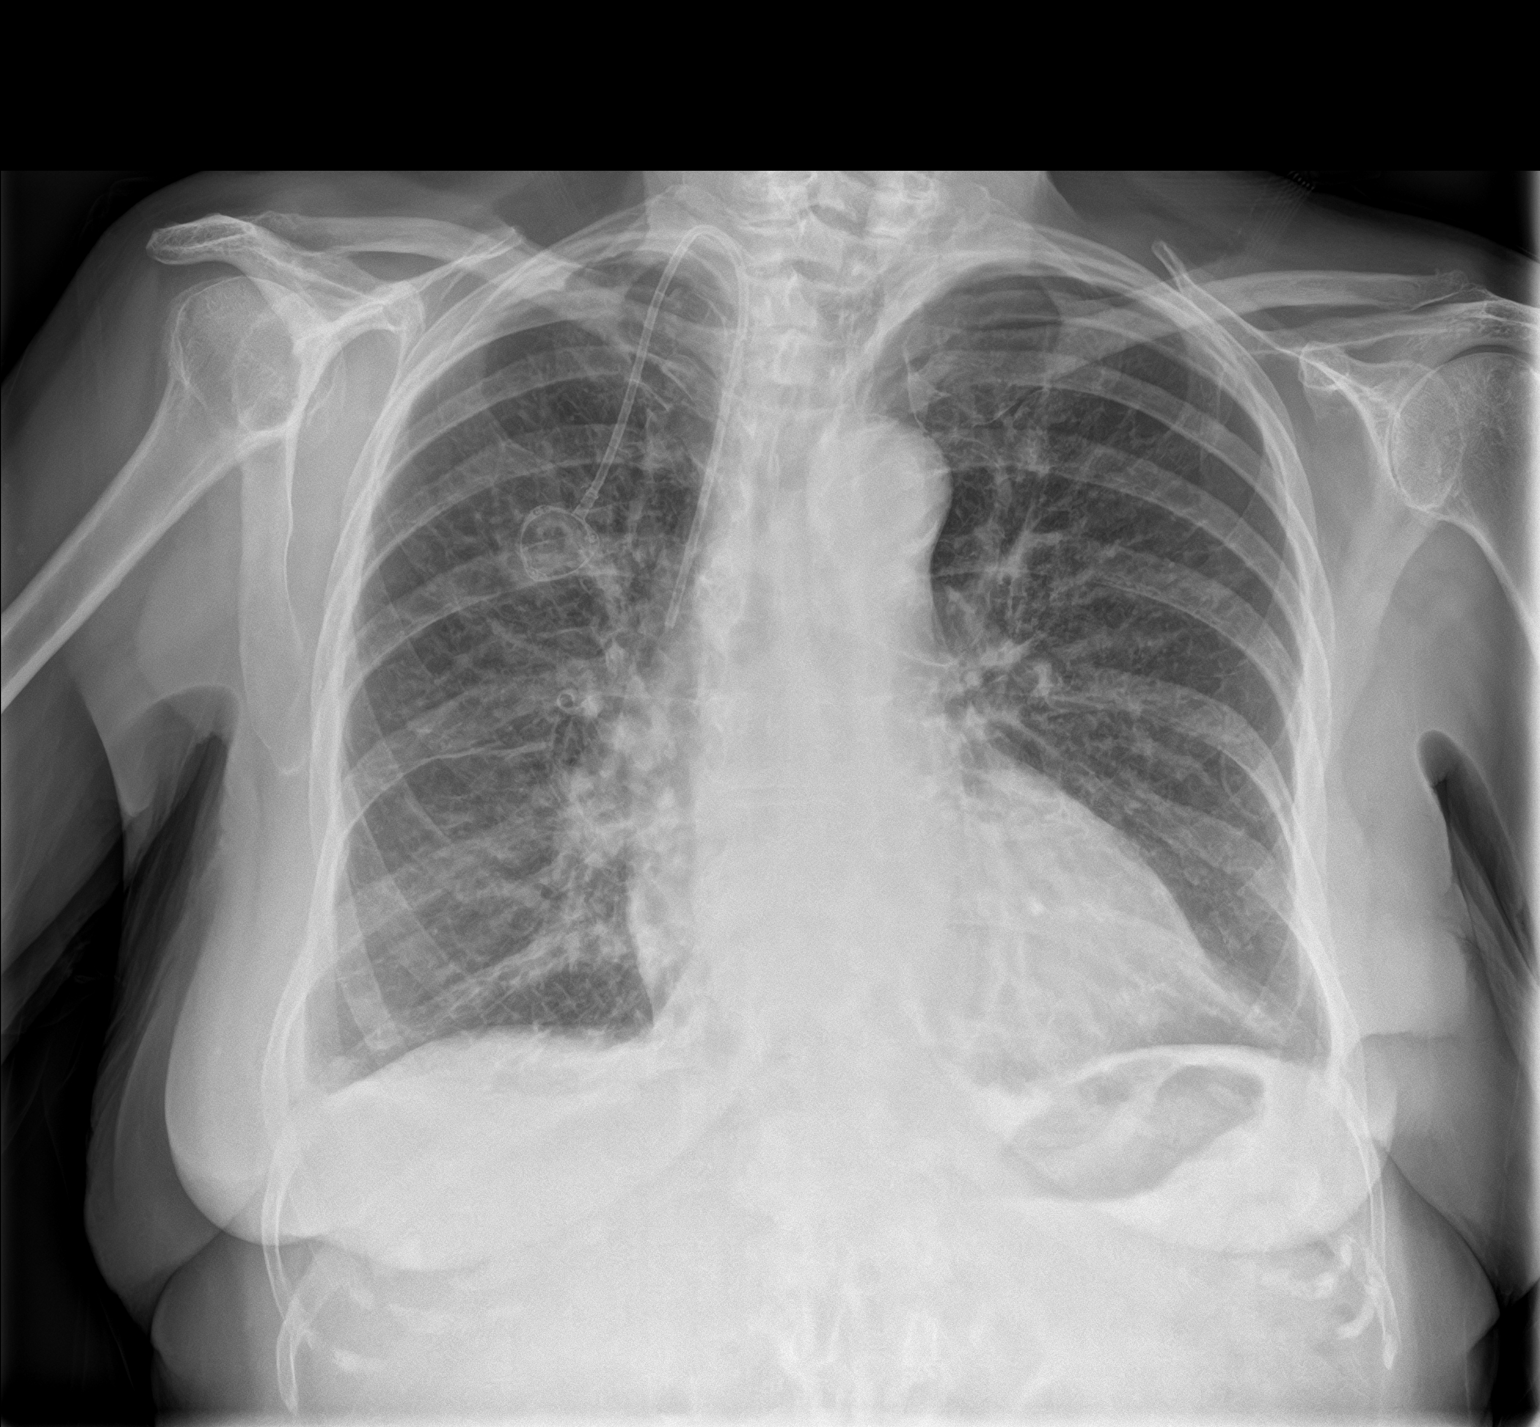
[im 2/2]
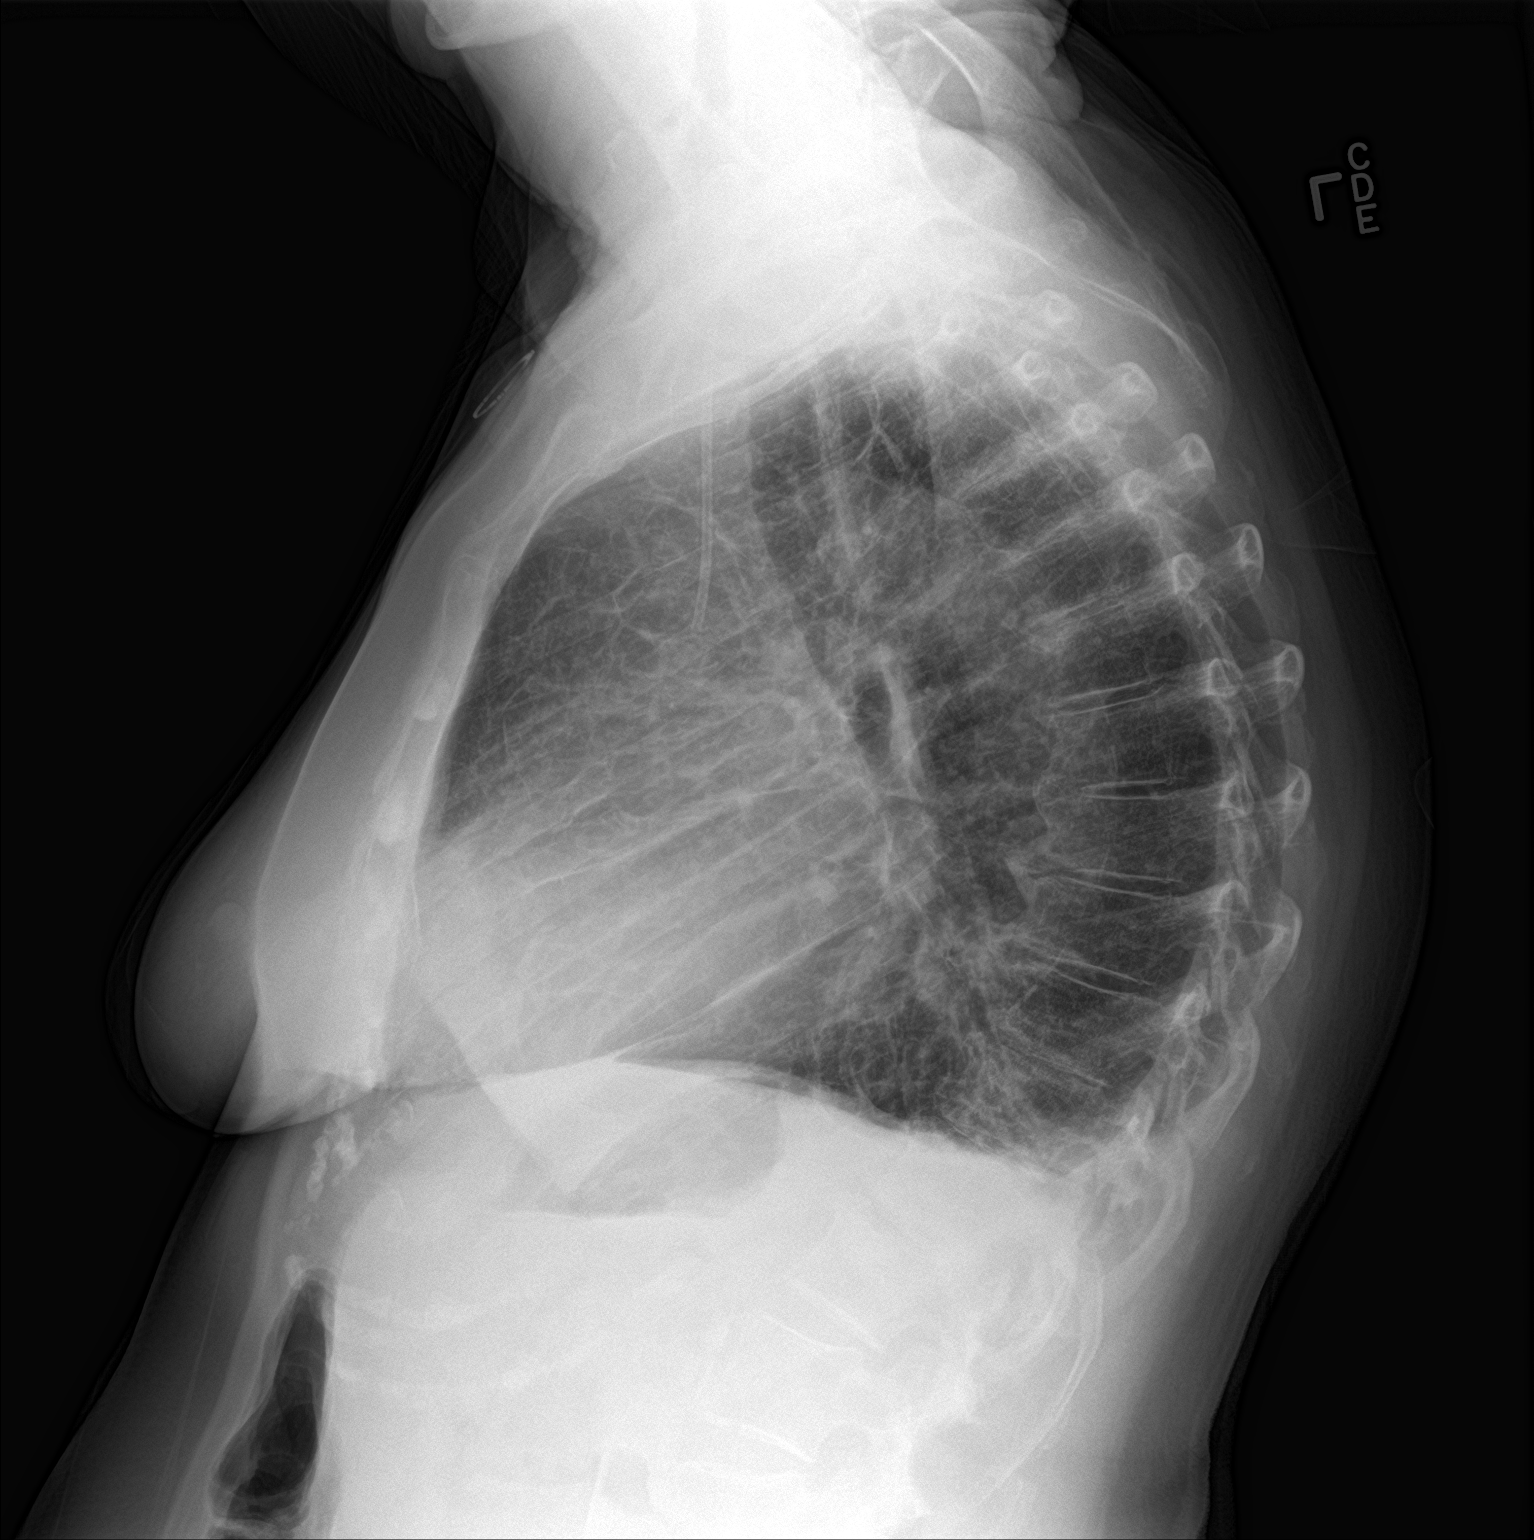

[2 of 2 positions shown; findings below may reference images not displayed]

FINDINGS: The cardiac silhouette is mildly enlarged and unchanged. Calcified
aortic knob. Small pleural effusions. No focal consolidation. RIGHT
single-lumen chest Port-A-Cath distal tip projects in mid superior
vena cava. No pneumothorax. Soft tissue planes and included osseous
structures are nonsuspicious. Calcifications in the neck are likely
vascular. Osteopenia.
IMPRESSION: Mild cardiomegaly.  Small pleural effusions.

Aortic Atherosclerosis (1Y50F-TIE.E).

## 2018-09-25 IMAGING — DX DG CHEST 1V PORT
1 series · 1 of 1 positions shown · non-contrast
Comparison: Chest CTA 12/19/2016 and earlier.

CLINICAL DATA: 83-year-old female undergoing chemotherapy for
ovarian cancer. Treatment last week with subsequent shortness of
breath.

EXAM:
PORTABLE CHEST 1 VIEW

[chest ap]
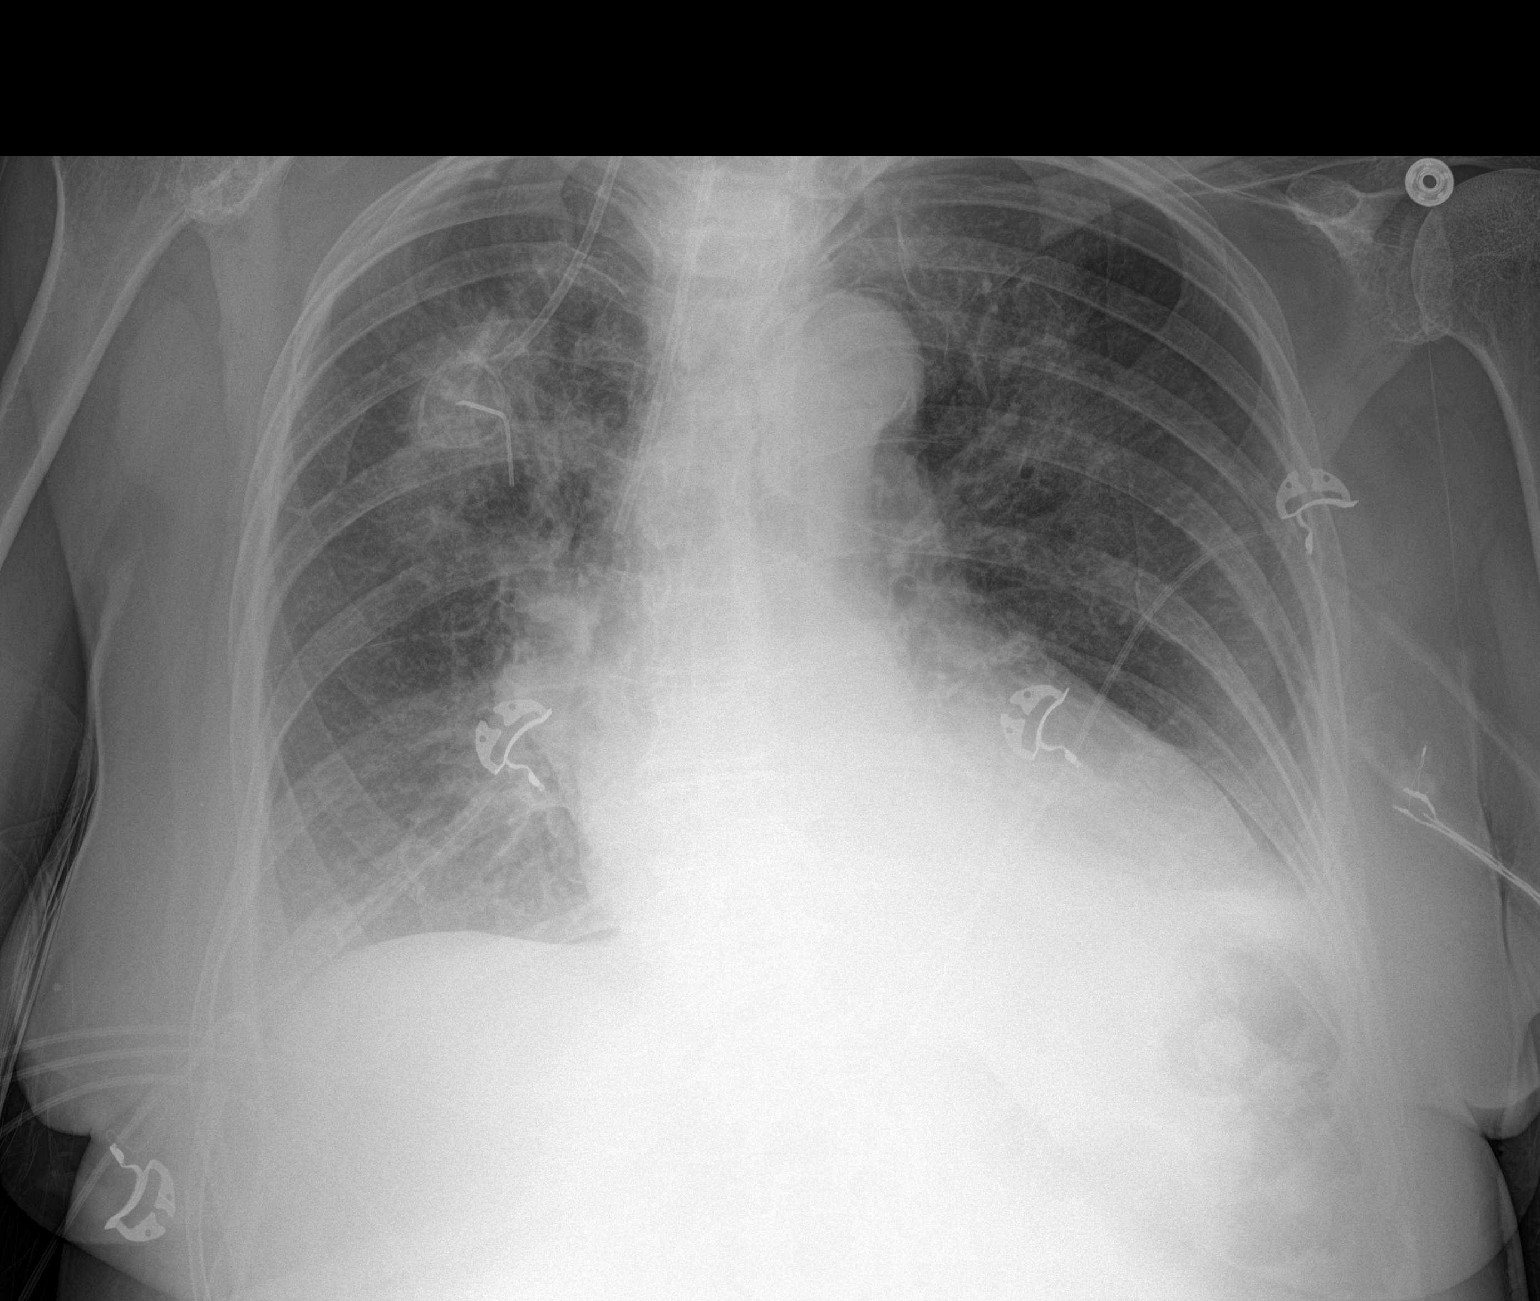

[1 of 1 positions shown; findings below may reference images not displayed]

FINDINGS: Portable AP upright view at 1131 hours. Stable right IJ approach
chest porta cath, currently accessed. Increased radiographic opacity
at both lung bases in part compatible with the pleural effusions
demonstrated by CTA yesterday. Left greater than right lower lobe
opacity increase. Increased patchy right perihilar opacity which
partially overlaps the right Port-A-Cath. No pneumothorax. Stable
cardiac size and mediastinal contours. No pneumothorax. Stable
pulmonary vascularity.
IMPRESSION: Increased bibasilar and right suprahilar pulmonary opacity since the
CTA yesterday appears to reflect a combination of airspace disease
and small pleural effusions. Consider acute multifocal infection
versus aspiration.

## 2018-09-27 IMAGING — DX DG CHEST 1V PORT
1 series · 1 of 1 positions shown · non-contrast
Comparison: December 20, 2016

CLINICAL DATA: Respiratory failure

EXAM:
PORTABLE CHEST 1 VIEW

[chest ap]
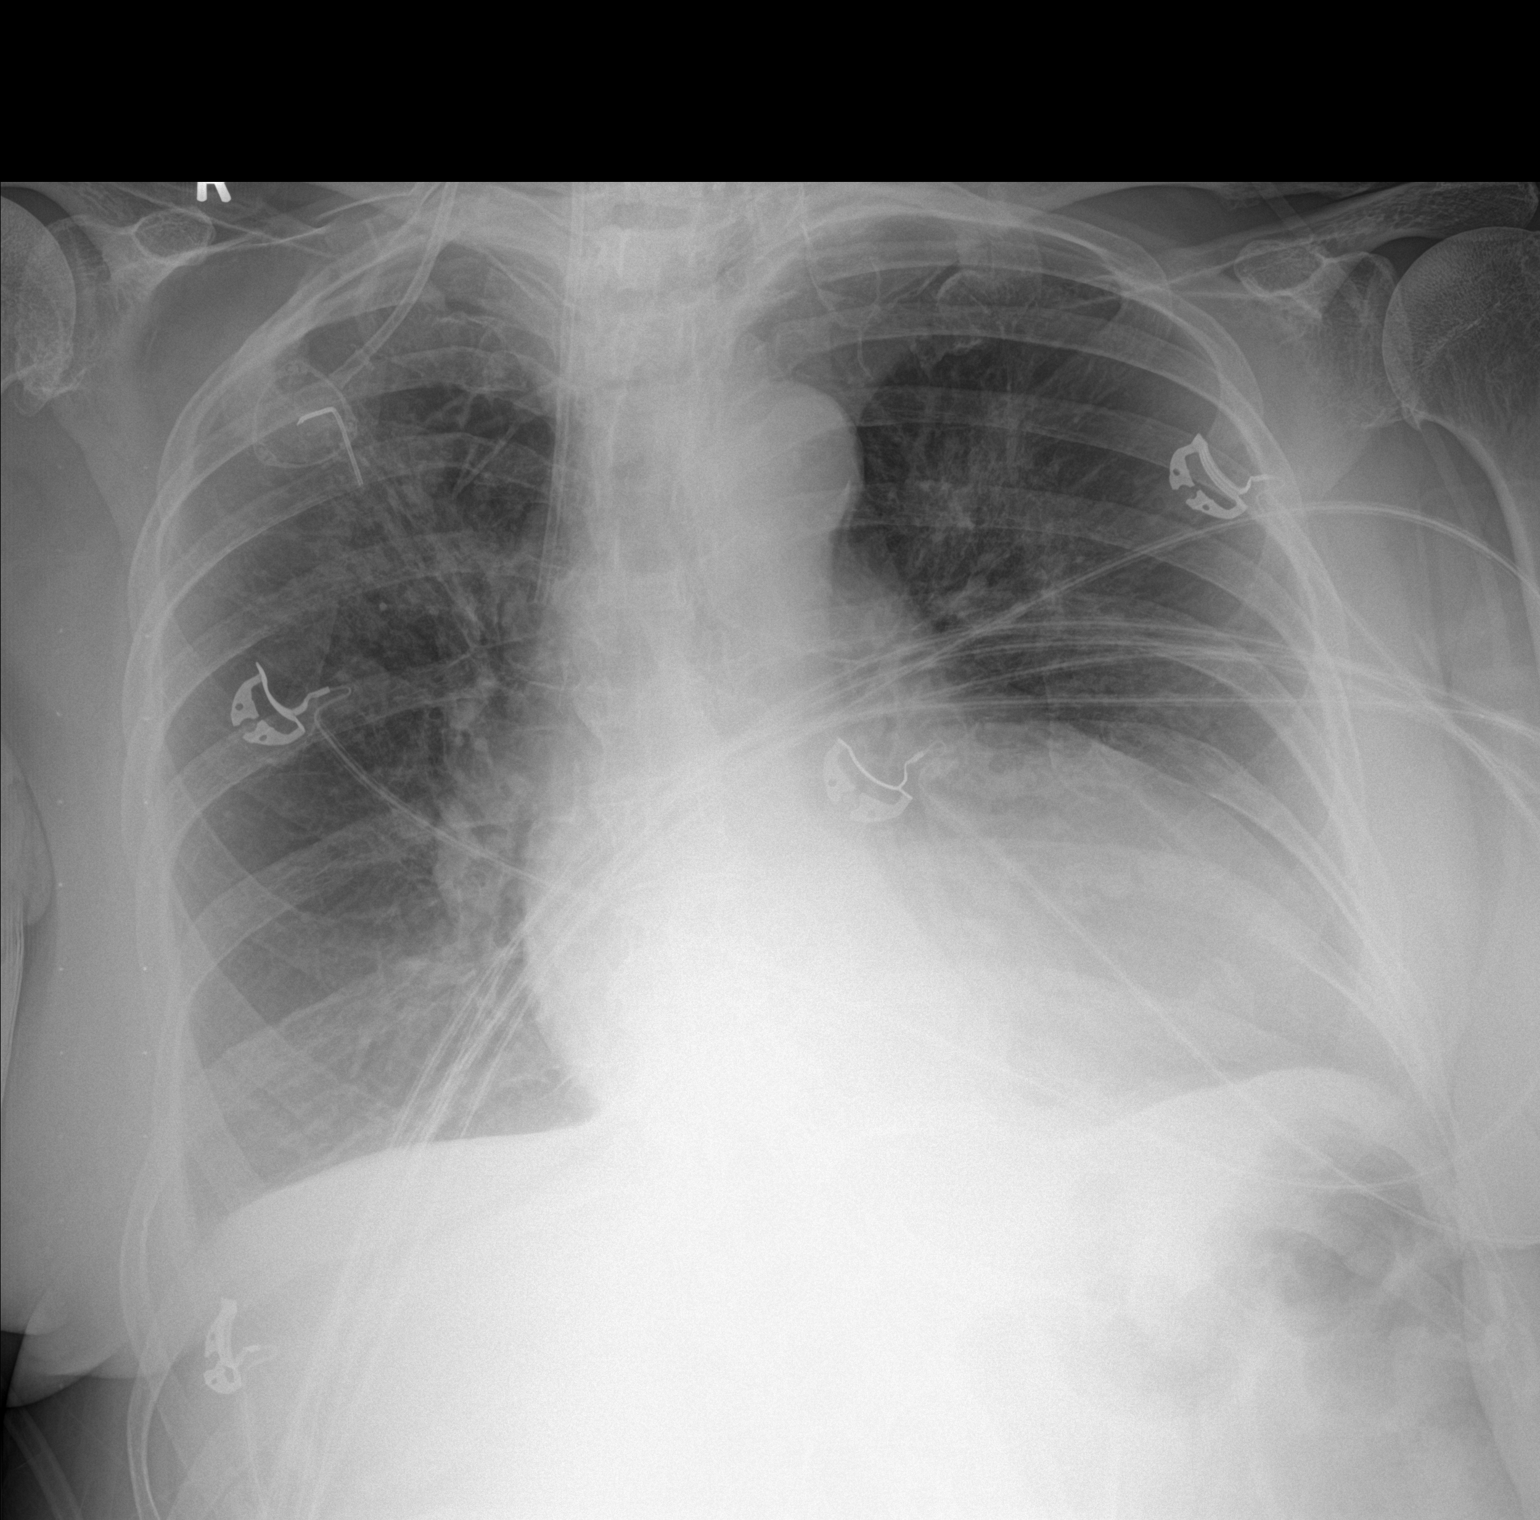

[1 of 1 positions shown; findings below may reference images not displayed]

FINDINGS: Port-A-Cath tip is in the superior vena cava. No pneumothorax. There
are small pleural effusions bilaterally. There is no edema or
consolidation. There is cardiomegaly with pulmonary venous
hypertension. No adenopathy. There is aortic atherosclerosis. There
is evidence of old trauma involving the lateral left clavicle,
stable.
IMPRESSION: Pulmonary vascular congestion with small pleural effusions
bilaterally. No edema or consolidation evident. There is aortic
atherosclerosis. No pneumothorax.

Aortic Atherosclerosis (9YIEX-A49.9).

## 2018-09-28 IMAGING — DX DG CHEST 1V PORT
1 series · 1 of 1 positions shown · non-contrast
Comparison: 12/22/2016 .

CLINICAL DATA: Respiratory failure.

EXAM:
PORTABLE CHEST 1 VIEW

[chest ap]
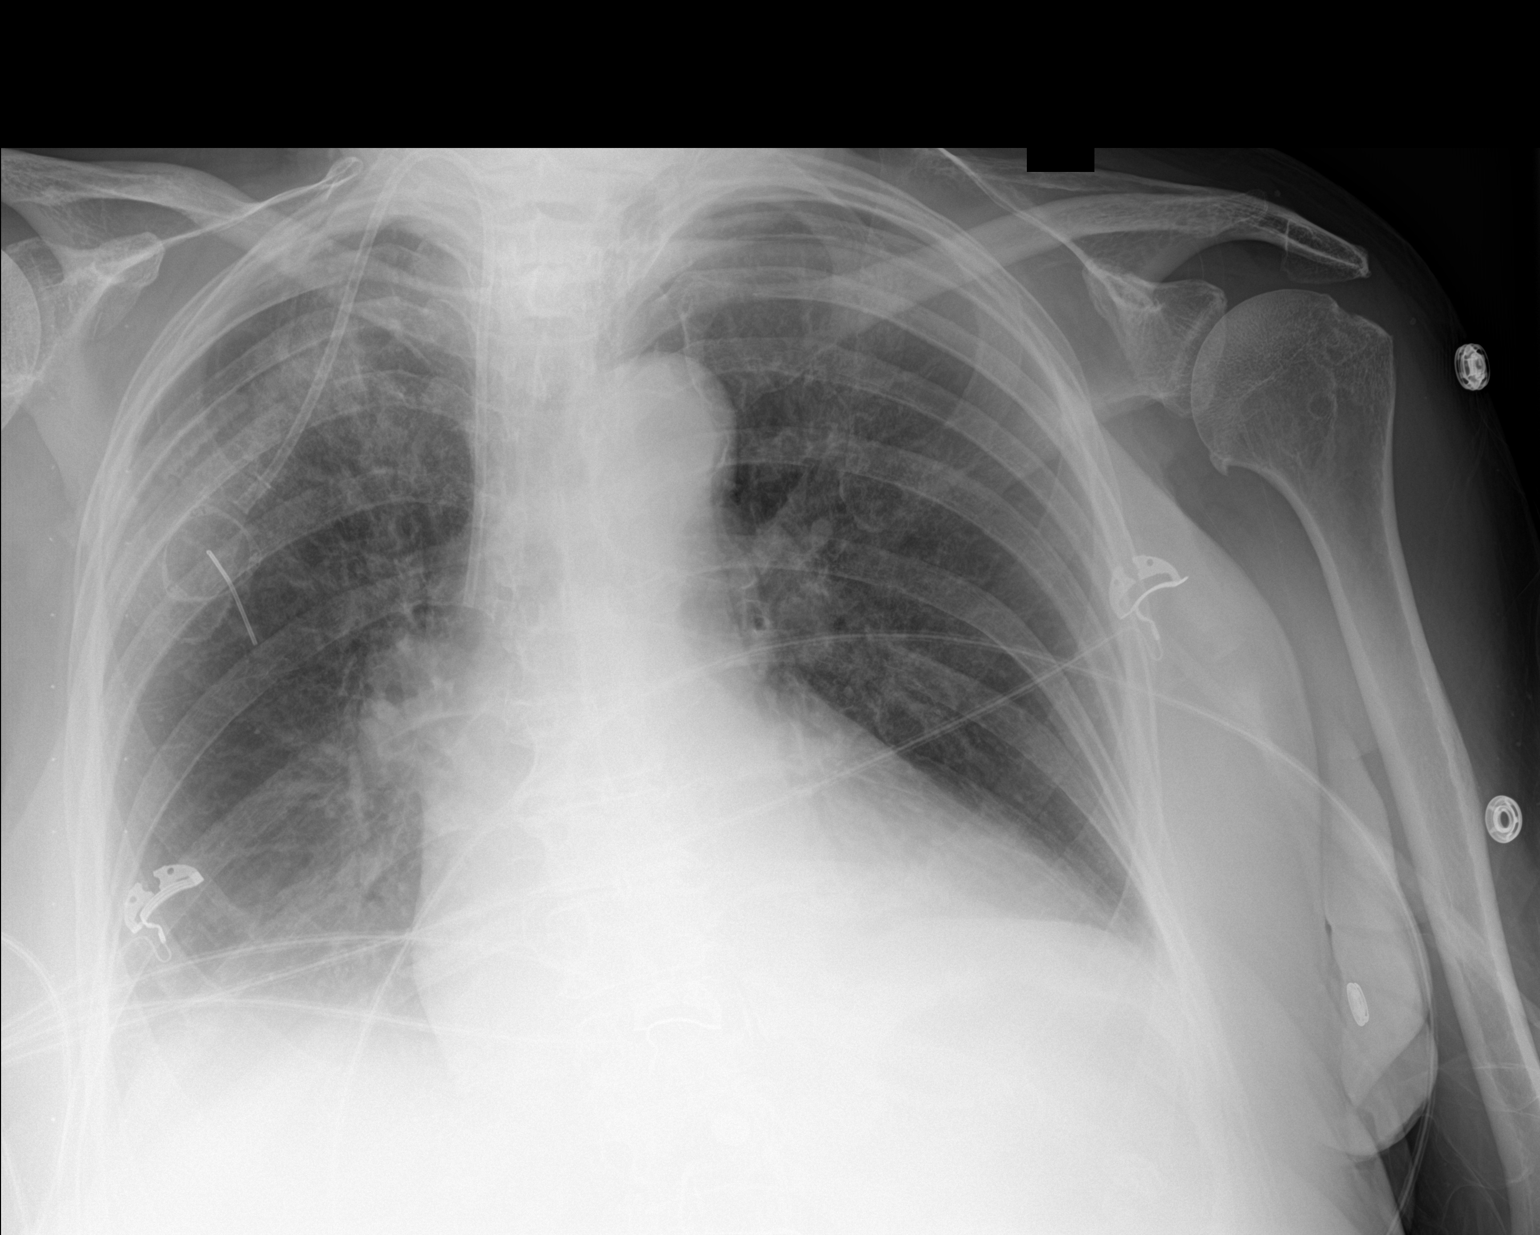

[1 of 1 positions shown; findings below may reference images not displayed]

FINDINGS: PowerPort catheter noted with tip over the superior vena cava .
Cardiomegaly with bilateral from interstitial prominence and
bilateral pleural effusions consistent with CHF. Bilateral
pneumonitis cannot be excluded. No pneumothorax.
IMPRESSION: 1. PowerPort catheter noted with tip noted over the superior vena
cava .

2. Cardiomegaly with mild pulmonary interstitial prominence and
bilateral pleural effusions consistent CHF. Bilateral pneumonitis
cannot be excluded.

## 2018-10-01 IMAGING — DX DG CHEST 1V PORT
1 series · 1 of 1 positions shown · non-contrast
Comparison: 12/23/2016

CLINICAL DATA: Shortness of Breath

EXAM:
PORTABLE CHEST 1 VIEW

[chest ap]
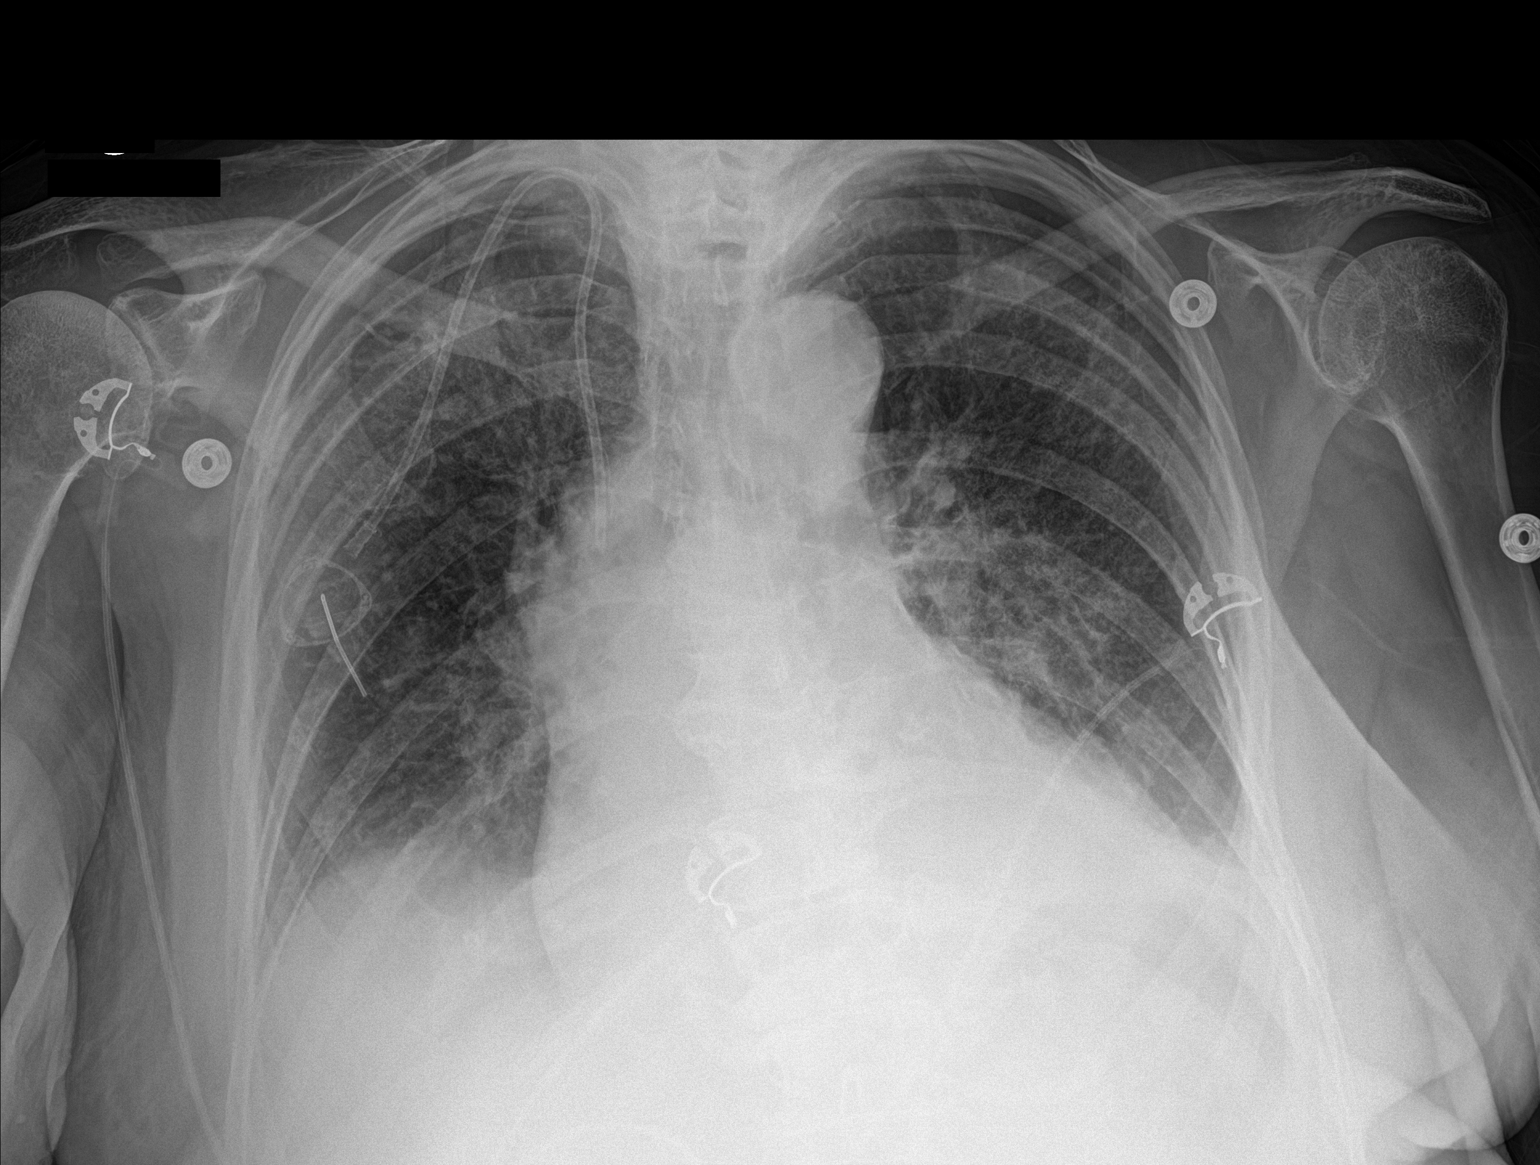

[1 of 1 positions shown; findings below may reference images not displayed]

FINDINGS: Cardiac shadow is stable. Persistent right upper lobe infiltrative
changes are seen. Increasing central vascular congestion and
interstitial changes noted. Likely small effusions are present as
well.
IMPRESSION: Slight increase in the degree of CHF.

Patchy infiltrative changes in the right upper lobe are again noted.
